# Patient Record
Sex: Female | Born: 1940 | Race: White | Hispanic: No | Smoking: Never smoker
Health system: Southern US, Community
[De-identification: ages and names within clinical notes are randomized; demographics above are authoritative.]

## PROBLEM LIST (undated history)

## (undated) DIAGNOSIS — K589 Irritable bowel syndrome without diarrhea: Secondary | ICD-10-CM

## (undated) DIAGNOSIS — I10 Essential (primary) hypertension: Secondary | ICD-10-CM

## (undated) DIAGNOSIS — S83006A Unspecified dislocation of unspecified patella, initial encounter: Secondary | ICD-10-CM

## (undated) DIAGNOSIS — S82402A Unspecified fracture of shaft of left fibula, initial encounter for closed fracture: Secondary | ICD-10-CM

## (undated) DIAGNOSIS — J189 Pneumonia, unspecified organism: Secondary | ICD-10-CM

## (undated) DIAGNOSIS — M797 Fibromyalgia: Secondary | ICD-10-CM

## (undated) DIAGNOSIS — E785 Hyperlipidemia, unspecified: Secondary | ICD-10-CM

## (undated) DIAGNOSIS — I341 Nonrheumatic mitral (valve) prolapse: Secondary | ICD-10-CM

## (undated) DIAGNOSIS — S82009A Unspecified fracture of unspecified patella, initial encounter for closed fracture: Secondary | ICD-10-CM

## (undated) DIAGNOSIS — K5790 Diverticulosis of intestine, part unspecified, without perforation or abscess without bleeding: Secondary | ICD-10-CM

## (undated) DIAGNOSIS — T8859XA Other complications of anesthesia, initial encounter: Secondary | ICD-10-CM

## (undated) DIAGNOSIS — K219 Gastro-esophageal reflux disease without esophagitis: Secondary | ICD-10-CM

## (undated) HISTORY — PX: TONSILLECTOMY: SUR1361

## (undated) HISTORY — PX: COLONOSCOPY: SHX174

## (undated) HISTORY — DX: Irritable bowel syndrome, unspecified: K58.9

## (undated) HISTORY — PX: APPENDECTOMY: SHX54

## (undated) HISTORY — DX: Hyperlipidemia, unspecified: E78.5

## (undated) HISTORY — DX: Fibromyalgia: M79.7

## (undated) HISTORY — DX: Gastro-esophageal reflux disease without esophagitis: K21.9

## (undated) HISTORY — DX: Diverticulosis of intestine, part unspecified, without perforation or abscess without bleeding: K57.90

## (undated) HISTORY — DX: Unspecified fracture of shaft of left fibula, initial encounter for closed fracture: S82.402A

## (undated) HISTORY — PX: OTHER SURGICAL HISTORY: SHX169

## (undated) HISTORY — DX: Unspecified fracture of unspecified patella, initial encounter for closed fracture: S82.009A

## (undated) HISTORY — DX: Nonrheumatic mitral (valve) prolapse: I34.1

## (undated) HISTORY — DX: Unspecified dislocation of unspecified patella, initial encounter: S83.006A

## (undated) HISTORY — PX: BREAST BIOPSY: SHX20

## (undated) HISTORY — PX: TOOTH EXTRACTION: SUR596

## (undated) HISTORY — PX: HERNIA REPAIR: SHX51

## (undated) HISTORY — PX: CATARACT EXTRACTION: SUR2

## (undated) HISTORY — DX: Essential (primary) hypertension: I10

---
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
## 1975-07-07 HISTORY — PX: VAGINAL HYSTERECTOMY: SUR661

## 1998-03-13 ENCOUNTER — Ambulatory Visit (HOSPITAL_COMMUNITY): Admission: RE | Admit: 1998-03-13 | Discharge: 1998-03-13 | Payer: Self-pay

## 1998-04-12 ENCOUNTER — Ambulatory Visit (HOSPITAL_COMMUNITY): Admission: RE | Admit: 1998-04-12 | Discharge: 1998-04-12 | Payer: Self-pay | Admitting: Gastroenterology

## 1998-04-22 ENCOUNTER — Other Ambulatory Visit: Admission: RE | Admit: 1998-04-22 | Discharge: 1998-04-22 | Payer: Self-pay | Admitting: Obstetrics and Gynecology

## 1998-06-12 ENCOUNTER — Ambulatory Visit (HOSPITAL_COMMUNITY): Admission: RE | Admit: 1998-06-12 | Discharge: 1998-06-12 | Payer: Self-pay

## 1998-07-01 ENCOUNTER — Encounter: Payer: Self-pay | Admitting: Obstetrics and Gynecology

## 1998-07-09 ENCOUNTER — Observation Stay (HOSPITAL_COMMUNITY): Admission: RE | Admit: 1998-07-09 | Discharge: 1998-07-10 | Payer: Self-pay | Admitting: Obstetrics and Gynecology

## 1998-07-10 HISTORY — PX: UMBILICAL HERNIA REPAIR: SHX196

## 1998-07-10 HISTORY — PX: BILATERAL SALPINGOOPHORECTOMY: SHX1223

## 2000-03-22 ENCOUNTER — Emergency Department (HOSPITAL_COMMUNITY): Admission: EM | Admit: 2000-03-22 | Discharge: 2000-03-22 | Payer: Self-pay | Admitting: Emergency Medicine

## 2000-03-22 ENCOUNTER — Encounter: Payer: Self-pay | Admitting: Emergency Medicine

## 2000-12-13 ENCOUNTER — Other Ambulatory Visit: Admission: RE | Admit: 2000-12-13 | Discharge: 2000-12-13 | Payer: Self-pay | Admitting: Obstetrics and Gynecology

## 2000-12-13 LAB — HM PAP SMEAR: HM Pap smear: NEGATIVE

## 2000-12-14 ENCOUNTER — Encounter: Admission: RE | Admit: 2000-12-14 | Discharge: 2000-12-14 | Payer: Self-pay | Admitting: Neurology

## 2000-12-14 ENCOUNTER — Encounter: Payer: Self-pay | Admitting: Neurology

## 2000-12-22 ENCOUNTER — Ambulatory Visit (HOSPITAL_COMMUNITY): Admission: RE | Admit: 2000-12-22 | Discharge: 2000-12-22 | Payer: Self-pay | Admitting: Neurology

## 2001-10-06 ENCOUNTER — Ambulatory Visit (HOSPITAL_COMMUNITY): Admission: RE | Admit: 2001-10-06 | Discharge: 2001-10-06 | Payer: Self-pay | Admitting: Gastroenterology

## 2003-11-19 ENCOUNTER — Ambulatory Visit (HOSPITAL_COMMUNITY): Admission: RE | Admit: 2003-11-19 | Discharge: 2003-11-19 | Payer: Self-pay | Admitting: Gastroenterology

## 2003-12-03 ENCOUNTER — Ambulatory Visit (HOSPITAL_COMMUNITY): Admission: RE | Admit: 2003-12-03 | Discharge: 2003-12-03 | Payer: Self-pay | Admitting: Gastroenterology

## 2004-02-04 HISTORY — PX: CHOLECYSTECTOMY: SHX55

## 2004-02-08 ENCOUNTER — Encounter (INDEPENDENT_AMBULATORY_CARE_PROVIDER_SITE_OTHER): Payer: Self-pay | Admitting: Specialist

## 2004-02-08 ENCOUNTER — Observation Stay (HOSPITAL_COMMUNITY): Admission: RE | Admit: 2004-02-08 | Discharge: 2004-02-09 | Payer: Self-pay | Admitting: Surgery

## 2005-05-29 ENCOUNTER — Emergency Department (HOSPITAL_COMMUNITY): Admission: EM | Admit: 2005-05-29 | Discharge: 2005-05-29 | Payer: Self-pay | Admitting: Emergency Medicine

## 2005-06-17 ENCOUNTER — Encounter: Admission: RE | Admit: 2005-06-17 | Discharge: 2005-06-17 | Payer: Self-pay | Admitting: Gastroenterology

## 2005-07-08 ENCOUNTER — Encounter: Admission: RE | Admit: 2005-07-08 | Discharge: 2005-07-08 | Payer: Self-pay | Admitting: Gastroenterology

## 2008-09-03 HISTORY — PX: BREAST BIOPSY: SHX20

## 2008-09-25 ENCOUNTER — Encounter (INDEPENDENT_AMBULATORY_CARE_PROVIDER_SITE_OTHER): Payer: Self-pay | Admitting: Surgery

## 2008-09-25 ENCOUNTER — Ambulatory Visit (HOSPITAL_BASED_OUTPATIENT_CLINIC_OR_DEPARTMENT_OTHER): Admission: RE | Admit: 2008-09-25 | Discharge: 2008-09-25 | Payer: Self-pay | Admitting: Surgery

## 2009-12-16 ENCOUNTER — Encounter: Admission: RE | Admit: 2009-12-16 | Discharge: 2009-12-16 | Payer: Self-pay | Admitting: Family Medicine

## 2010-01-03 LAB — HM DEXA SCAN

## 2010-11-18 NOTE — Op Note (Signed)
Veronica Franklin, Veronica Franklin               ACCOUNT NO.:  [id number]   MEDICAL RECORD NO.:  [id number]          PATIENT TYPE:  AMB   LOCATION:  DSC                          FACILITY:  [facility]   PHYSICIAN:  Currie Paris, M.D.DATE OF BIRTH:  [birth year]   DATE OF PROCEDURE:  09/25/2008  DATE OF DISCHARGE:                               OPERATIVE REPORT   PREOPERATIVE DIAGNOSES:  1. Probable inflamed epidermoid cyst, right axilla.  2. Dermal cyst, left breast.   POSTOPERATIVE DIAGNOSES:  1. Probable inflamed epidermoid cyst, right axilla.  2. Dermal cyst, left breast.   OPERATION:  Excision of epidermoid cyst of right axilla and left breast.   SURGEON:  Currie Paris, MD   ANESTHESIA:  Local.   CLINICAL HISTORY:  This is a 70 year old lady who has two aforementioned  masses that she wished to have removed.  Since I saw her in the office,  the right one in the axilla has become little bit red.   DESCRIPTION OF PROCEDURE:  The patient was taken to the operating room  for local anesthesia excision.  Both lesions were marked and we  confirmed verbally both the location and the plans with the patient.   Both areas were prepped with some alcohol and anesthetized with a  combination of 1% Xylocaine plain and 0.5% Marcaine with epinephrine.  I  waited 10 minutes for good local effect.  Both areas were then prepped.  The small dermal lesion on the breast was excised into the subcutaneous  tissues.  This was closed with a single 3-0 Vicryl suture and Dermabond.   The lesion on the left axilla was excised with an elliptical excision of  skin overlying it and came out apparently intact with a margin of fatty  tissue deep.  The incision was closed in layers with 3-0 Vicryl and 4-0  Monocryl subcuticular and Dermabond.   The patient tolerated the procedure well and there were no  complications.      Currie Paris, M.D.  Electronically Signed     CJS/MEDQ  D:  09/25/2008  T:   09/25/2008  Job:  [id number]

## 2010-11-21 NOTE — Procedures (Signed)
[facility]  Patient:    Veronica, JEFCOAT [facility] C Visit Number: [id number] MRN: [id number]          Service Type: END Location: ENDO Attending Physician:  Charna Elizabeth Dictated by:   Anselmo Rod, M.D. Proc. Date: 10/06/01 Admit Date:  10/06/2001   CC:         Laqueta Linden, M.D.  Jamesetta Geralds, M.D.   Procedure Report  DATE OF BIRTH:  October 19, 1950.  PROCEDURE:  Colonoscopy.  ENDOSCOPIST:  Anselmo Rod, M.D.  INSTRUMENT USED:  Olympus video colonoscope.  INDICATION FOR PROCEDURE:  Rectal bleeding in a 70 year old white female. Rule out colonic masses, polyps, hemorrhoids, etc.  PREPROCEDURE PREPARATION:  Informed consent was procured from the patient. The patient was fasted for eight hours prior to the procedure and prepped with a bottle of magnesium citrate and a gallon of NuLytely the night prior to the procedure.  PREPROCEDURE PHYSICAL:  VITAL SIGNS:  The patient had stable vital signs.  NECK:  Supple.  CHEST:  Clear to auscultation.  S1, S2 regular.  ABDOMEN:  Soft with normal bowel sounds.  DESCRIPTION OF PROCEDURE:  The patient was placed in the left lateral decubitus position and sedated with 100 mcg of fentanyl and 10 mg of Versed intravenously.  Once the patient was adequately sedate and maintained on low-flow oxygen and continuous cardiac monitoring, the Olympus video colonoscope was advanced from the rectum to the cecum with slight difficulty secondary to some residual stool in the colon.  There were a few left-sided diverticula seen with inspissated stool in some of the diverticular pockets. Small internal hemorrhoids were appreciated on retroflexion in the rectum. The rest of the colonic mucosa up to the cecum appeared normal.  IMPRESSION: 1. Left-sided diverticulosis. 2. Small, nonbleeding internal hemorrhoids. 3. No large masses or polyps seen. 4. Otherwise normal colonoscopy up to the  cecum.  IMPRESSION: 1. A high-fiber diet has been advised for the patient. 2. Repeat colorectal cancer screening is recommended in the next five years    unless the patient were to develop any abnormal symptoms in the interim. 3. Outpatient follow-up on a p.r.n. basis.  RECOMMENDATIONS: Dictated by:   Anselmo Rod, M.D. Attending Physician:  Charna Elizabeth DD:  10/06/01 TD:  10/07/01 Job: [id number]

## 2010-11-21 NOTE — Op Note (Signed)
NAME:  Veronica Franklin, Veronica Franklin                         ACCOUNT NO.:  [id number]   MEDICAL RECORD NO.:  [id number]                   PATIENT TYPE:  OBV   LOCATION:  0465                                 FACILITY[facility]  [facility]   PHYSICIAN:  [name], M.D.              DATE OF BIRTH:  [birth year]   DATE OF PROCEDURE:  02/08/2004  DATE OF DISCHARGE:  02/09/2004                                 OPERATIVE REPORT   PREOPERATIVE DIAGNOSIS:  Biliary dyskinesia.   POSTOPERATIVE DIAGNOSIS:  Biliary dyskinesia.   PROCEDURE:  Laparoscopic cholecystectomy.   SURGEON:  Dr. Riley Lam A. Blackman   ANESTHESIA:  General endotracheal and 0.25% Marcaine.   ESTIMATED BLOOD LOSS:  Minimal.   FINDINGS:  The patient was found to have a chronically scarred-appearing  gallbladder with multiple adhesions to it.  Cholangiogram was not performed.   PROCEDURE IN DETAIL:  The patient was brought to the operating room and  identified as Sheralyn Boatman.  She was placed supine on the operating room  table, and anesthesia was induced.  Her abdomen was then prepped and draped  in the usual sterile fashion.  Using a #15 blade, a small vertical incision  was made below the umbilicus.  The incision was carried down to the fascia  which was then opened with a scalpel.  A hemostat was then used to pass into  the peritoneal cavity.  A 0 Vicryl pursestring suture was placed around the  fascial opening.  The Hasson port was placed through the opening, and  insufflation of the abdomen was begun.  A 12 mm port was then placed in the  patient's epigastrium under direct vision.  The patient was found to have  adhesions of omentum to the abdominal wall, and these were taken down with  the laparoscopic scissors.  Two 5 mm ports were then placed in the patient's  right flank under direct vision.  The gallbladder was then identified and  retracted above the liver bed.  Multiple adhesions of omentum to the  gallbladder were then taken down  bluntly.  Dissection was then carried down  to the base of the gallbladder.  What was thought to be a band of peritoneum  ended up being the small cystic duct.  It was clipped twice proximally, once  distally before being transected.  Since this was performed, the decision  was made to forego cholangiogram, but this was obviously the cystic duct.  At this point, the cystic artery was then identified and clipped twice  proximally, once distally, and transected as well.  The gallbladder was then  easily dissected free from the liver bed with the electrocautery.  Once the  gallbladder was free from the liver bed, it was removed through the incision  at the umbilicus.  The 0 Vicryl at the umbilicus was tied in place closing  the fascial defect.  The liver bed was again examined, and hemostasis was  felt to be achieved.  At this point, all ports were removed under direct  vision after the abdomen was irrigated.  The abdomen was then deflated.  All  incisions were anesthetized with  0.25% Marcaine and closed with 4-0 Monocryl subcuticular sutures.  Steri-  Strips, gauze, and tape were then applied.  The patient tolerated the  procedure well.  All sponge, needle, and instrument counts were correct at  the end of the procedure.  The patient was then extubated in the operating  room and taken in stable condition to the recovery room.                                               [name], M.D.    DB/MEDQ  D:  02/08/2004  T:  02/10/2004  Job:  [id number]

## 2012-09-30 LAB — HM COLONOSCOPY

## 2013-01-26 ENCOUNTER — Encounter: Payer: Self-pay | Admitting: Nurse Practitioner

## 2013-01-26 ENCOUNTER — Encounter: Payer: Self-pay | Admitting: *Deleted

## 2013-01-26 ENCOUNTER — Ambulatory Visit (INDEPENDENT_AMBULATORY_CARE_PROVIDER_SITE_OTHER): Payer: Medicare Other | Admitting: Nurse Practitioner

## 2013-01-26 VITALS — BP 114/60 | HR 64 | Temp 98.3°F | Resp 12 | Ht 64.0 in | Wt 142.4 lb

## 2013-01-26 DIAGNOSIS — Z13820 Encounter for screening for osteoporosis: Secondary | ICD-10-CM

## 2013-01-26 DIAGNOSIS — Z9289 Personal history of other medical treatment: Secondary | ICD-10-CM

## 2013-01-26 DIAGNOSIS — Z9189 Other specified personal risk factors, not elsewhere classified: Secondary | ICD-10-CM

## 2013-01-26 DIAGNOSIS — B3731 Acute candidiasis of vulva and vagina: Secondary | ICD-10-CM

## 2013-01-26 DIAGNOSIS — B373 Candidiasis of vulva and vagina: Secondary | ICD-10-CM

## 2013-01-26 DIAGNOSIS — N390 Urinary tract infection, site not specified: Secondary | ICD-10-CM

## 2013-01-26 LAB — POCT URINALYSIS DIPSTICK
Leukocytes, UA: NEGATIVE
Spec Grav, UA: 1.02
Urobilinogen, UA: NEGATIVE
pH, UA: 6.5

## 2013-01-26 MED ORDER — NYSTATIN-TRIAMCINOLONE 100000-0.1 UNIT/GM-% EX OINT: TOPICAL_OINTMENT | Freq: Two times a day (BID) | CUTANEOUS | Status: DC

## 2013-01-26 MED ORDER — CIPROFLOXACIN HCL 500 MG PO TABS: 500.0000 mg | ORAL_TABLET | Freq: Two times a day (BID) | ORAL | Status: DC

## 2013-01-26 MED ORDER — FLUCONAZOLE 150 MG PO TABS: 150.0000 mg | ORAL_TABLET | Freq: Once | ORAL | Status: DC

## 2013-01-26 NOTE — Progress Notes (Signed)
Subjective:     Patient ID: Veronica Franklin, female   DOB: 10/29/1940, 72 y.o.   MRN: [id number]  HPI  72 yo WD Fe presents with symptoms of frequent urination.  She noted symptoms on Sunday and Monday and had associated  low back pain. Denies fever or chills. Her other symptoms seem to be related to a chronic perianal discomfort.  She felt symptoms were related to having a large BM and noted BRRB. She then saw Dr. Loreta Ave and related this finding  and had further evaluation including a coloscopy that was normal in 08/2012.  Since then she still has peri anal itching and at times specks of blood that she feels is related to scratching as it only there when she wipes.  Never anything with pad wear. She is ready to leave [place] on Saturday for 2 weeks.  Review of Systems  Constitutional: Negative for fever, chills and fatigue.  Respiratory: Negative.   Cardiovascular: Negative.  Negative for chest pain and palpitations.  Gastrointestinal: Positive for blood in stool and anal bleeding. Negative for nausea, vomiting, abdominal pain, diarrhea, constipation and abdominal distention.       Blood with large BM that is only occasional. Negative GI evaluation 2/ 2014.  Genitourinary: Positive for dysuria, urgency, frequency, vaginal discharge, difficulty urinating and pelvic pain.       Peri anal discomfort with irritation, burning and occasional specks of blood. Recent UTI symptoms as noted.  Musculoskeletal: Positive for back pain.       Low back pain but not at flank.  Neurological: Negative for dizziness, syncope, weakness, numbness and headaches.  Psychiatric/Behavioral: Negative for confusion and sleep disturbance. The patient is not nervous/anxious.        Objective:   Physical Exam  Constitutional: She is oriented to person, place, and time. She appears well-developed and well-nourished.  Cardiovascular: Normal rate.   Pulmonary/Chest: Effort normal.  Abdominal: Soft. She  exhibits no distension and no mass. There is no rebound and no guarding.  Genitourinary:    No vaginal discharge found.  Multiple areas external vulva with linear cuts and maceration of skin consistent with chronic yeast.  No vaginal discharge. Since patient unable to void - sterile In/ Out cath specimen was obtained using sterile technique.  Chemstrip was negative and urine was sent for C&S. Patient tolerated procedure well.  Musculoskeletal: Normal range of motion.  Neurological: She is alert and oriented to person, place, and time.  Psychiatric: She has a normal mood and affect. Her behavior is normal. Judgment and thought content normal.       Assessment:     External Yeast Vulvitis extending to the perianal area  R/O UTI Trip out of the country for two weeks this Saturday.    Plan:    Cipro 500 mg BID for 3 days just in case culture comes back + while she is away ( she is given instructions for longer use if symptoms come back or worsens) # 20 /0 She is given Diflucan 150 mg to take today and repeat in 3 days ( #4 tabs given for extra while she is away if indicated) Rx for Mycolog to use prn BID. When she returns will report if she is not better. Or if worsens will seek medical care with their team. She ask that we go ahead and send order for Mammo including 3D and BMD to [facility] for later this summer. This is done.

## 2013-01-26 NOTE — Patient Instructions (Signed)
Monilial Vaginitis Vaginitis in a soreness, swelling and redness (inflammation) of the vagina and vulva. Monilial vaginitis is not a sexually transmitted infection. CAUSES  Yeast vaginitis is caused by yeast (candida) that is normally found in your vagina. With a yeast infection, the candida has overgrown in number to a point that upsets the chemical balance. SYMPTOMS   White, thick vaginal discharge.  Swelling, itching, redness and irritation of the vagina and possibly the lips of the vagina (vulva).  Burning or painful urination.  Painful intercourse. DIAGNOSIS  Things that may contribute to monilial vaginitis are:  Postmenopausal and virginal states.  Pregnancy.  Infections.  Being tired, sick or stressed, especially if you had monilial vaginitis in the past.  Diabetes. Good control will help lower the chance.      Take Diflucan 150 mg today and repeat on Saturday , keep other two tablets if you need while away every three days Take Cipro 500 mg two times daily for 3 days,  Then if symptoms persist or worsen continue at 2 X daily  Tight fitting garments.  Using bubble bath, feminine sprays, douches or deodorant tampons.  Taking certain medications that kill germs (antibiotics).  Sporadic recurrence can occur if you become ill. TREATMENT  Your caregiver will give you medication.  There are several kinds of anti monilial vaginal creams and suppositories specific for monilial vaginitis. For recurrent yeast infections, use a suppository or cream in the vagina 2 times a week, or as directed.  Anti-monilial or steroid cream for the itching or irritation of the vulva may also be used. Get your caregiver's permission.  Painting the vagina with methylene blue solution may help if the monilial cream does not work.  Eating yogurt may help prevent monilial vaginitis. HOME CARE INSTRUCTIONS   Finish all medication as prescribed.  Do not have sex until treatment is  completed or after your caregiver tells you it is okay.  Take warm sitz baths.  Do not douche.  Do not use tampons, especially scented ones.  Wear cotton underwear.  Avoid tight pants and panty hose.  Tell your sexual partner that you have a yeast infection. They should go to their caregiver if they have symptoms such as mild rash or itching.  Your sexual partner should be treated as well if your infection is difficult to eliminate.  Practice safer sex. Use condoms.  Some vaginal medications cause latex condoms to fail. Vaginal medications that harm condoms are:  Cleocin cream.  Butoconazole (Femstat).  Terconazole (Terazol) vaginal suppository.  Miconazole (Monistat) (may be purchased over the counter). SEEK MEDICAL CARE IF:   You have a temperature by mouth above 102 F (38.9 C).  The infection is getting worse after 2 days of treatment.  The infection is not getting better after 3 days of treatment.  You develop blisters in or around your vagina.  You develop vaginal bleeding, and it is not your menstrual period.  You have pain when you urinate.  You develop intestinal problems.  You have pain with sexual intercourse. Document Released: 04/01/2005 Document Revised: 09/14/2011 Document Reviewed: 12/14/2008 [facility] Patient Information 2014 [place], [place].

## 2013-01-28 LAB — URINE CULTURE
Colony Count: NO GROWTH
Organism ID, Bacteria: NO GROWTH

## 2013-01-30 NOTE — Progress Notes (Signed)
Encounter reviewed by Dr. Naiomi Musto Silva.  

## 2013-01-31 ENCOUNTER — Telehealth: Payer: Self-pay | Admitting: *Deleted

## 2013-01-31 NOTE — Telephone Encounter (Signed)
Message copied by Osie Bond on Tue Jan 31, 2013 11:28 AM ------      Message from: Ria Comment R      Created: Mon Jan 30, 2013  6:45 PM       She is out of the country but you may call and leave a message at home with results of neg urine CS ------

## 2013-01-31 NOTE — Telephone Encounter (Signed)
Pt is aware of negative urine culture results, via voice mail.

## 2013-03-28 ENCOUNTER — Telehealth: Payer: Self-pay | Admitting: Nurse Practitioner

## 2013-03-28 NOTE — Telephone Encounter (Signed)
Call to [facility] to advise we have faxed an order for pt to have BMD today at her noon appt. Order received.

## 2013-03-28 NOTE — Telephone Encounter (Signed)
[facility] appointment for MMG and bone density today, 03/28/13, at 12:00 PM and needs an order sent for the appointment please.

## 2013-03-29 ENCOUNTER — Telehealth: Payer: Self-pay | Admitting: Obstetrics and Gynecology

## 2013-03-29 NOTE — Telephone Encounter (Signed)
Patient calling with a billing question. °

## 2013-04-10 ENCOUNTER — Telehealth: Payer: Self-pay | Admitting: *Deleted

## 2013-04-10 NOTE — Telephone Encounter (Signed)
Pt informed of results to be treated with Lamisil or laser, pt states would like to schedule for laser.  I transferred to [facility] to schedule.

## 2013-04-10 NOTE — Telephone Encounter (Signed)
Left message to call the [facility] for lab results.

## 2013-04-11 ENCOUNTER — Telehealth: Payer: Self-pay | Admitting: Nurse Practitioner

## 2013-04-11 NOTE — Telephone Encounter (Signed)
Please advise Ms. [name] 

## 2013-04-11 NOTE — Telephone Encounter (Signed)
Patient is calling for a medication refill but the pharmacy is no longer carrying the medication. She has been with out the medication since Friday. Pharmacy told her it would be ready for her tomorrow but when she went to pick it up they told her that they no longer carry the medication anymore.  Cenestin  [facility] on [address]

## 2013-04-12 MED ORDER — ESTERIFIED ESTROGENS 0.3 MG PO TABS: 0.3000 mg | ORAL_TABLET | Freq: Every day | ORAL | Status: DC

## 2013-04-12 NOTE — Telephone Encounter (Signed)
Okay to send in one month supply?

## 2013-04-12 NOTE — Telephone Encounter (Signed)
LM on VM that rx  (Menest 0.3 mg was called in)  and to call us back if there are any problems or issues

## 2013-04-12 NOTE — Telephone Encounter (Signed)
AEX is 05/04/13 with you

## 2013-04-12 NOTE — Telephone Encounter (Signed)
There is no other dose of a generic that is equal to her dose of Cenestin.  I would suggest to change to Menest 0.3 mg daily as a substitute.

## 2013-04-12 NOTE — Telephone Encounter (Signed)
Order is placed in Epic.

## 2013-05-04 ENCOUNTER — Encounter: Payer: Self-pay | Admitting: Nurse Practitioner

## 2013-05-04 ENCOUNTER — Ambulatory Visit (INDEPENDENT_AMBULATORY_CARE_PROVIDER_SITE_OTHER): Payer: Medicare Other | Admitting: Nurse Practitioner

## 2013-05-04 VITALS — BP 100/60 | HR 64 | Resp 16 | Ht 65.0 in | Wt 144.0 lb

## 2013-05-04 DIAGNOSIS — Z01419 Encounter for gynecological examination (general) (routine) without abnormal findings: Secondary | ICD-10-CM

## 2013-05-04 MED ORDER — ESTERIFIED ESTROGENS 0.3 MG PO TABS: 0.3000 mg | ORAL_TABLET | Freq: Every day | ORAL | Status: DC

## 2013-05-04 NOTE — Progress Notes (Signed)
Encounter reviewed by Dr. Kayle Correa Silva.  

## 2013-05-04 NOTE — Patient Instructions (Signed)

## 2013-05-04 NOTE — Progress Notes (Signed)
Patient ID: Veronica Franklin, female   DOB: 19-Dec-1940, 72 y.o.   MRN: [id number] 73 y.o. G3P3 Divorced Caucasian Fe here for annual exam.  recent [place] travel this year.  No LMP recorded. Patient has had a hysterectomy.          Sexually active: no  The current method of family planning is status post hysterectomy.    Exercising: no  The patient does not participate in regular exercise at present. Smoker:  no  Health Maintenance: Pap: 12/13/00, WNL MMG: 03/28/13, 3D: benign Colonoscopy: 09/30/12, repeat 10 years BMD: 03/28/13, no result at time of visit - sent to PCP TDaP:12/13/10 Labs: PCP   reports that she has never smoked. She has never used smokeless tobacco. She reports that she does not drink alcohol or use illicit drugs.  Past Medical History  Diagnosis Date  . Fibromyalgia   . IBS (irritable bowel syndrome)   . Diverticulosis     Past Surgical History  Procedure Laterality Date  . Vaginal delivery      x3  . Vaginal hysterectomy    . Bilateral salpingoophorectomy    . Cholecystectomy    . Breast biopsy    . Colonoscopy    . Umbilical hernia repair    . Thigh lipoma Right     removed    Current Outpatient Prescriptions  Medication Sig Dispense Refill  . calcium carbonate (TUMS - DOSED IN MG ELEMENTAL CALCIUM) 500 MG chewable tablet Chew 1 tablet by mouth 2 (two) times daily.      . cetirizine (ZYRTEC) 10 MG tablet Take 10 mg by mouth daily.      Marland Kitchen conjugated estrogens (PREMARIN) vaginal cream Place vaginally daily.      Marland Kitchen Dexlansoprazole (DEXILANT) 30 MG capsule Take 60 mg by mouth daily.      . Esterified Estrogens (MENEST) 0.3 MG tablet Take 1 tablet (0.3 mg total) by mouth daily.  30 tablet  0  . hydrochlorothiazide (HYDRODIURIL) 25 MG tablet Take 25 mg by mouth daily.      . hyoscyamine (LEVBID) 0.375 MG 12 hr tablet Take 0.375 mg by mouth every 12 (twelve) hours as needed for cramping.      . montelukast (SINGULAIR) 10 MG tablet Take 10 mg by mouth at bedtime.       Marland Kitchen nystatin-triamcinolone ointment (MYCOLOG) Apply topically 2 (two) times daily.  30 g  3  . polycarbophil (FIBERCON) 625 MG tablet Take 625 mg by mouth daily.      . potassium gluconate 595 MG TABS Take 595 mg by mouth daily.      . propranolol (INDERAL) 40 MG tablet Take 40 mg by mouth 2 (two) times daily.      . sodium chloride (OCEAN NASAL SPRAY) 0.65 % nasal spray Place 1 spray into the nose as needed for congestion.      . vitamin B-12 (CYANOCOBALAMIN) 100 MCG tablet Take 50 mcg by mouth daily.       No current facility-administered medications for this visit.    Family History  Problem Relation Age of Onset  . Heart failure Mother   . Ulcers Mother   . Pleurisy Father   . Cancer Maternal Aunt     uterine cancer  . Cancer Paternal Aunt     lymphoma  . Diabetes Maternal Grandmother   . Diverticulitis Son   . Diverticulitis Daughter     ROS:  Pertinent items are noted in HPI.  Otherwise, a comprehensive ROS was  negative.  Exam:   BP 100/60  Pulse 64  Resp 16  Ht 5\' 5"  (1.651 m)  Wt 144 lb (65.318 kg)  BMI 23.96 kg/m2 Height: 5\' 5"  (165.1 cm)  Ht Readings from Last 3 Encounters:  05/04/13 5\' 5"  (1.651 m)  01/26/13 5\' 4"  (1.626 m)    General appearance: alert, cooperative and appears stated age Head: Normocephalic, without obvious abnormality, atraumatic Neck: no adenopathy, supple, symmetrical, trachea midline and thyroid normal to inspection and palpation Lungs: clear to auscultation bilaterally Breasts: normal appearance, no masses or tenderness Heart: regular rate and rhythm Abdomen: soft, non-tender; no masses,  no organomegaly Extremities: extremities normal, atraumatic, no cyanosis or edema Skin: Skin color, texture, turgor normal. No rashes or lesions Lymph nodes: Cervical, supraclavicular, and axillary nodes normal. No abnormal inguinal nodes palpated Neurologic: Grossly normal   Pelvic: External genitalia:  no lesions              Urethra:   normal appearing urethra with no masses, tenderness or lesions              Bartholin's and Skene's: normal                 Vagina: atrophic appearing vagina with normal color and discharge, no lesions              Cervix: absent              Pap taken: no Bimanual Exam:  Uterus:  uterus absent              Adnexa: no mass, fullness, tenderness               Rectovaginal: Confirms               Anus:  normal sphincter tone, no lesions  A:  Well Woman with normal exam  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  Discussed risk with DVT, CVA, and cancer  Does not need a refill of Premarin  cream at this time - will call back  Counseled on breast self exam, adequate intake of calcium and vitamin D, diet and exercise, Kegel's exercises return annually or prn  An After Visit Summary was printed and given to the patient.

## 2013-05-05 ENCOUNTER — Ambulatory Visit (INDEPENDENT_AMBULATORY_CARE_PROVIDER_SITE_OTHER): Payer: Medicare Other | Admitting: Podiatrist

## 2013-05-05 ENCOUNTER — Encounter: Payer: Self-pay | Admitting: Podiatrist

## 2013-05-05 VITALS — BP 109/66 | HR 73 | Resp 16 | Ht 65.0 in | Wt 144.5 lb

## 2013-05-05 DIAGNOSIS — B351 Tinea unguium: Secondary | ICD-10-CM

## 2013-05-05 NOTE — Progress Notes (Signed)
Subjective:  Patient presents today for lasering of left hallux nail. Patient relates it is still thick and discolored.  Today is her first laser visit Objective:  Neurovascular status unchanged with palpable symmetric pedal pulses.  Left great toenail is thickened and yellowed.  Mycotic appearance of toenail is seen.   Assessment:  Onychomycosis x 1 toenail  Laserering of Toenails left great toe carried out at today's visit via Q-switch YAG laser by [employer] at continuous Patient and staff were wearing appropriate laser protective goggles/eyewear Laser device was tested prior to use and safety protocols were followed Frequency of 5 Hz, Level 4, Joules 7.5 delivered.          The patient tolerated the lasering well and will call if any concerns arise.

## 2013-05-15 ENCOUNTER — Telehealth: Payer: Self-pay

## 2013-05-15 NOTE — Telephone Encounter (Signed)
Lmtcb//kn 

## 2013-05-17 NOTE — Telephone Encounter (Signed)
Returning a call to [facility]. (patient is aware Tresa Endo is out of the office today)

## 2013-05-18 NOTE — Telephone Encounter (Signed)
Lmtcb//kn 

## 2013-05-23 NOTE — Telephone Encounter (Signed)
Patient notified of BMD results. States is still having lower back pain-seems worse in the morning and at night, better during the day. She is not sure if this is related to her fibromyalgia. States also thinks she is having some urine frequency, but not sure. States would like to monitor symptoms and will call back if any changes. Did see PG over the summer with these same symptoms and had negative urine culture. Please advise if this is ok or if I need to call her back with any recommendations.

## 2013-05-23 NOTE — Telephone Encounter (Signed)
Could also be arthritis.  Ok to watch symptoms but please let us know if anything worsens.

## 2013-05-29 NOTE — Telephone Encounter (Signed)
Patient returning Kelly's call. °

## 2013-05-29 NOTE — Telephone Encounter (Signed)
Lmtcb//kn 

## 2013-06-07 NOTE — Telephone Encounter (Signed)
Patient aware, will call back prn//kn

## 2013-06-09 ENCOUNTER — Ambulatory Visit: Payer: Medicare Other | Admitting: Podiatrist

## 2013-07-06 HISTORY — PX: KNEE DISLOCATION SURGERY: SHX689

## 2013-07-07 ENCOUNTER — Ambulatory Visit (INDEPENDENT_AMBULATORY_CARE_PROVIDER_SITE_OTHER): Payer: Medicare Other | Admitting: Podiatrist

## 2013-07-07 ENCOUNTER — Encounter: Payer: Self-pay | Admitting: Podiatrist

## 2013-07-07 VITALS — BP 129/70 | HR 72 | Resp 12

## 2013-07-07 DIAGNOSIS — B351 Tinea unguium: Secondary | ICD-10-CM

## 2013-07-07 NOTE — Progress Notes (Signed)
Subjective: Patient presents today for second lasering of left hallux nail. Patient relates it is still thick and discolored. However she's noticed a change in the base of the nail. Objective: Neurovascular status unchanged with palpable symmetric pedal pulses. Left great toenail is thickened and yellowed. Mycotic appearance of toenail is seen. Mild clearing at the base of the toenail is seen. Assessment: Onychomycosis x 1 toenail  Laserering of Toenails left great toe carried out at today's visit via Q-switch YAG laser by [employer] at continuous  Patient and staff were wearing appropriate laser protective goggles/eyewear  Laser device was tested prior to use and safety protocols were followed  Frequency of 5 Hz, Level 4, Joules 7.5 delivered.  The patient tolerated the lasering well and will be seen back in 2 months for her final laser followup call if any concerns arise.

## 2013-07-07 NOTE — Progress Notes (Deleted)
   Subjective:    Patient ID: Veronica Franklin, female    DOB: 1940/12/08, 73 y.o.   MRN: [id number]  HPI Comments: '' THE LT FOOT GREAT TOENAIL LOOKS MUCH BETTER.''     Review of Systems     Objective:   Physical Exam        Assessment & Plan:

## 2013-08-04 ENCOUNTER — Encounter: Payer: Self-pay | Admitting: Podiatrist

## 2013-08-25 ENCOUNTER — Telehealth: Payer: Self-pay | Admitting: Obstetrics & Gynecology

## 2013-08-25 NOTE — Telephone Encounter (Signed)
Spoke with patient. She complains of lower back pain that has been "going on for while now." Patient  With hx of fibromyalgia.  Denies any urinary complaints, no pelvic pain, no fevers, no vaginal bleeding or discharge.  She states that pain comes and goes and it is better during the day and worsens at night. Pain does not radiate. No injuries, no trauma. Patient states she is going out of town and thought "I better just get this checked out". Advised that patient should be seen by primary care physician as they may be better able to manage lower back pain. Patient is agreeable and states that she will call Dr. Charlott Rakesoss's office now.  Routing to provider for final review. Patient agreeable to disposition. Will close encounter   [name] do you agree?

## 2013-08-25 NOTE — Telephone Encounter (Signed)
Patient is having lower back pain. 

## 2013-09-08 ENCOUNTER — Ambulatory Visit (INDEPENDENT_AMBULATORY_CARE_PROVIDER_SITE_OTHER): Payer: Medicare Other | Admitting: Podiatrist

## 2013-09-08 ENCOUNTER — Encounter: Payer: Self-pay | Admitting: Podiatrist

## 2013-09-08 VITALS — BP 128/75 | HR 73 | Resp 18

## 2013-09-08 DIAGNOSIS — B351 Tinea unguium: Secondary | ICD-10-CM

## 2013-09-08 NOTE — Progress Notes (Signed)
I can tell that it has growed out on my left foot left great toenail  Patient presents for her 3rd lasering on the left great toenail.  She can see some clearing of the proximal portion but states the toenail grows very slowly.    Laserering of Toenail- Hallux nail left carried out at today's visit via Q-switch YAG laser by [employer] at continuous Patient and staff were wearing appropriate laser protective goggles/eyewear Laser device was tested prior to use and safety protocols were followed Frequency of 5 Hz, Level 4, Joules 7.5 delivered.        The patient tolerated the lasering well and will call if any concerns arise. I will see her back in 2 months for additional lasering-  She may need 2 more treatments due to the slow growth of the toenail

## 2013-10-04 DIAGNOSIS — S83006A Unspecified dislocation of unspecified patella, initial encounter: Secondary | ICD-10-CM

## 2013-10-04 DIAGNOSIS — S82402A Unspecified fracture of shaft of left fibula, initial encounter for closed fracture: Secondary | ICD-10-CM

## 2013-10-04 HISTORY — DX: Unspecified dislocation of unspecified patella, initial encounter: S83.006A

## 2013-10-04 HISTORY — DX: Unspecified fracture of shaft of left fibula, initial encounter for closed fracture: S82.402A

## 2013-10-30 ENCOUNTER — Emergency Department (HOSPITAL_COMMUNITY)
Admission: EM | Admit: 2013-10-30 | Discharge: 2013-10-30 | Disposition: A | Discharge status: Home / Self Care | Payer: Medicare Other | Attending: Emergency Medicine | Admitting: Emergency Medicine

## 2013-10-30 ENCOUNTER — Encounter (HOSPITAL_COMMUNITY): Payer: Self-pay | Admitting: Emergency Medicine

## 2013-10-30 ENCOUNTER — Emergency Department (HOSPITAL_COMMUNITY): Payer: Medicare Other

## 2013-10-30 DIAGNOSIS — Y92009 Unspecified place in unspecified non-institutional (private) residence as the place of occurrence of the external cause: Secondary | ICD-10-CM | POA: Insufficient documentation

## 2013-10-30 DIAGNOSIS — W010XXA Fall on same level from slipping, tripping and stumbling without subsequent striking against object, initial encounter: Secondary | ICD-10-CM | POA: Insufficient documentation

## 2013-10-30 DIAGNOSIS — S82009A Unspecified fracture of unspecified patella, initial encounter for closed fracture: Secondary | ICD-10-CM

## 2013-10-30 DIAGNOSIS — S83006A Unspecified dislocation of unspecified patella, initial encounter: Secondary | ICD-10-CM

## 2013-10-30 DIAGNOSIS — S82402A Unspecified fracture of shaft of left fibula, initial encounter for closed fracture: Secondary | ICD-10-CM

## 2013-10-30 DIAGNOSIS — Z8719 Personal history of other diseases of the digestive system: Secondary | ICD-10-CM | POA: Insufficient documentation

## 2013-10-30 DIAGNOSIS — IMO0002 Reserved for concepts with insufficient information to code with codable children: Secondary | ICD-10-CM | POA: Insufficient documentation

## 2013-10-30 DIAGNOSIS — M25462 Effusion, left knee: Secondary | ICD-10-CM

## 2013-10-30 DIAGNOSIS — Y9389 Activity, other specified: Secondary | ICD-10-CM | POA: Insufficient documentation

## 2013-10-30 DIAGNOSIS — S82899A Other fracture of unspecified lower leg, initial encounter for closed fracture: Secondary | ICD-10-CM | POA: Insufficient documentation

## 2013-10-30 DIAGNOSIS — I10 Essential (primary) hypertension: Secondary | ICD-10-CM | POA: Insufficient documentation

## 2013-10-30 DIAGNOSIS — Z79899 Other long term (current) drug therapy: Secondary | ICD-10-CM | POA: Insufficient documentation

## 2013-10-30 MED ORDER — FENTANYL CITRATE 0.05 MG/ML IJ SOLN
50.0000 ug | Freq: Once | INTRAMUSCULAR | Status: AC
  Administered 2013-10-30: 50 ug via INTRAVENOUS
  Filled 2013-10-30: qty 2

## 2013-10-30 MED ORDER — OXYCODONE-ACETAMINOPHEN 5-325 MG PO TABS: 1.0000 | ORAL_TABLET | Freq: Four times a day (QID) | ORAL | Status: DC | PRN

## 2013-10-30 MED ORDER — ONDANSETRON HCL 4 MG PO TABS
4.0000 mg | ORAL_TABLET | Freq: Once | ORAL | Status: AC
  Administered 2013-10-30: 4 mg via ORAL
  Filled 2013-10-30: qty 1

## 2013-10-30 MED ORDER — ONDANSETRON HCL 4 MG/2ML IJ SOLN
4.0000 mg | Freq: Once | INTRAMUSCULAR | Status: AC
  Administered 2013-10-30: 4 mg via INTRAVENOUS
  Filled 2013-10-30: qty 2

## 2013-10-30 MED ORDER — ONDANSETRON HCL 4 MG PO TABS: 4.0000 mg | ORAL_TABLET | Freq: Four times a day (QID) | ORAL | Status: DC

## 2013-10-30 NOTE — ED Notes (Addendum)
Per EMS: Pt reports slipping on some water in her kitchen and fell backwards. Pt denies LOC or hitting her head. Pt reports left ankle pain, left knee pain, and left lower back pain. Pt has moderate swelling to her left knee. Pt has strong pedal pulses and capillary refill is less than 2 seconds. Pt reports tingling to the toes of the left foot. Pt was given 50 mcg of fentanyl by EMS in route to facility. Pt is A/O x4. Pt reports a history of knee dislocation.

## 2013-10-30 NOTE — Discharge Instructions (Signed)
Cast or Splint Care Casts and splints support injured limbs and keep bones from moving while they heal. It is important to care for your cast or splint at home.  HOME CARE INSTRUCTIONS  Keep the cast or splint uncovered during the drying period. It can take 24 to 48 hours to dry if it is made of plaster. A fiberglass cast will dry in less than 1 hour.  Do not rest the cast on anything harder than a pillow for the first 24 hours.  Do not put weight on your injured limb or apply pressure to the cast until your health care provider gives you permission.  Keep the cast or splint dry. Wet casts or splints can lose their shape and may not support the limb as well. A wet cast that has lost its shape can also create harmful pressure on your skin when it dries. Also, wet skin can become infected.  Cover the cast or splint with a plastic bag when bathing or when out in the rain or snow. If the cast is on the trunk of the body, take sponge baths until the cast is removed.  If your cast does become wet, dry it with a towel or a blow dryer on the cool setting only.  Keep your cast or splint clean. Soiled casts may be wiped with a moistened cloth.  Do not place any hard or soft foreign objects under your cast or splint, such as cotton, toilet paper, lotion, or powder.  Do not try to scratch the skin under the cast with any object. The object could get stuck inside the cast. Also, scratching could lead to an infection. If itching is a problem, use a blow dryer on a cool setting to relieve discomfort.  Do not trim or cut your cast or remove padding from inside of it.  Exercise all joints next to the injury that are not immobilized by the cast or splint. For example, if you have a long leg cast, exercise the hip joint and toes. If you have an arm cast or splint, exercise the shoulder, elbow, thumb, and fingers.  Elevate your injured arm or leg on 1 or 2 pillows for the first 1 to 3 days to decrease  swelling and pain.It is best if you can comfortably elevate your cast so it is higher than your heart. SEEK MEDICAL CARE IF:   Your cast or splint cracks.  Your cast or splint is too tight or too loose.  You have unbearable itching inside the cast.  Your cast becomes wet or develops a soft spot or area.  You have a bad smell coming from inside your cast.  You get an object stuck under your cast.  Your skin around the cast becomes red or raw.  You have new pain or worsening pain after the cast has been applied. SEEK IMMEDIATE MEDICAL CARE IF:   You have fluid leaking through the cast.  You are unable to move your fingers or toes.  You have discolored (blue or white), cool, painful, or very swollen fingers or toes beyond the cast.  You have tingling or numbness around the injured area.  You have severe pain or pressure under the cast.  You have any difficulty with your breathing or have shortness of breath.  You have chest pain. Document Released: 06/19/2000 Document Revised: 04/12/2013 Document Reviewed: 12/29/2012 [facility] Patient Information 2014 [place], [place].  Fibular Fracture, Ankle, Adult, Undisplaced, Treated With Immobilization A simple fracture of the bone  below the knee on the outside of your leg (fibula) usually heals without problems. CAUSES Typically, a fibular fracture occurs as a result of trauma. A blow to the side of your leg or a powerful twisting movement can cause a fracture. Fibular fractures are often seen as a result of football, soccer, or skiing injuries. SYMPTOMS Symptoms of a fibular fracture can include:  Pain.  Shortening or abnormal alignment of your lower leg (angulation). DIAGNOSIS A health care provider will need to examine the leg. X-ray exams will be ordered for further to confirm the fracture and evaluate the extent and of the injury. TREATMENT  Typically, a cast or immobilizer is applied. Sometimes a splint is placed on these  fractures if it is needed for comfort or if the bones are badly out of place. Crutches may be needed to help you get around.  HOME CARE INSTRUCTIONS   Apply ice to the injured area:  Put ice in a plastic bag.  Place a towel between your skin and the bag.  Leave the ice on for 20 minutes, 2 3 times a day.  Use crutches as directed. Resume walking without crutches as directed by your health care provider or when comfortable doing so.  Only take over-the-counter or prescription medicines for pain, discomfort, or fever as directed by your health care provider.  Keeping your leg raised may lessen swelling.  If you have a removable splint or boot, do not remove the boot unless directed by your health care provider.  Do not not drive a car or operate a motor vehicle until your health care provider specifically tells you it is safe to do so. SEEK IMMEDIATE MEDICAL CARE IF:   Your cast gets damaged or breaks.  You have continued severe pain or more swelling than you did before the cast was put on, or the pain is not controlled with medications.  Your skin or nails below the injury turn blue or grey, or feel cold or numb.  There is a bad smell or pus coming from under the cast.  You develop severe pain in ankle or foot. MAKE SURE YOU:   Understand these instructions.  Will watch your condition.  Will get help right away if you are not doing well or get worse. Document Released: 03/14/2002 Document Revised: 04/12/2013 Document Reviewed: 02/01/2013 [facility] Patient Information 2014 [place], [place].  Patellar Dislocation A patellar dislocation occurs when your kneecap (patella) slips out of its normal position in a groove in front of the lower end of your thighbone (femur). This groove is called the patellofemoral groove.  CAUSES The kneecap is normally positioned over the front of the knee joint at the base of the thighbone. A kneecap can be dislocated when:  The kneecap is out of  place (patellar tracking disorder), and force is applied.  The foot is firmly planted pointing outward, and the knee bends with the thigh turned inward. This kind of injury is common during many sports activities.  The inner edge of the kneecap is hit, pushing it toward the outer side of the leg. SIGNS AND SYMPTOMS  Severe pain.  A misshapen knee that looks like a bone is out of position.  A popping sensation, followed by a feeling that something is out of place.  Inability to bend or straighten the knee.  Knee swelling.  Cool, pale skin or numbness and tingling in or below the affected knee. DIAGNOSIS  Your health care provider will physically examine the injured area. An  X-ray exam may be done to make sure a bone fracture has not occurred. In some cases, your health care provider may look inside your knee joint with an instrument much like a pencil-sized telescope (arthroscope). This may be done to make sure you have no loose cartilage in your joint. Loose cartilage is not visible on an X-ray image. TREATMENT  In many instances, the patella can be guided back into position without much difficulty. It often goes back into position by straightening the leg. Often, nothing more may be needed other than a brief period of immobilization followed by the exercises your health care provider recommends. If patellar dislocation starts to become frequent after the first incident, surgery may be needed to prevent your patella from slipping out of place. HOME CARE INSTRUCTIONS   Only take over-the-counter or prescription medicines for pain, discomfort, or fever as directed by your health care provider.  Use a knee brace if directed to do so by your health care provider.  Use crutches as instructed.  Apply ice to the injured knee:  Put ice in a plastic bag.  Place a towel between your skin and the bag.  Leave the ice on for 20 minutes, 2 3 times a day.  Follow your health care provider's  instructions for doing any recommended range-of-motion exercises or other exercises. SEEK IMMEDIATE MEDICAL CARE IF:  You have increased pain or swelling in the knee that is not relieved with medicine.  You have increasing inflammation in the knee.  You have locking or catching of your knee. MAKE SURE YOU:  Understand these instructions.  Will watch your condition.  Will get help right away if you are not doing well or get worse. Document Released: 03/17/2001 Document Revised: 04/12/2013 Document Reviewed: 02/01/2013 [facility] Patient Information 2014 [place], [place]. RICE: Routine Care for Injuries The routine care of many injuries includes Rest, Ice, Compression, and Elevation (RICE). HOME CARE INSTRUCTIONS  Rest is needed to allow your body to heal. Routine activities can usually be resumed when comfortable. Injured tendons and bones can take up to 6 weeks to heal. Tendons are the cord-like structures that attach muscle to bone.  Ice following an injury helps keep the swelling down and reduces pain.  Put ice in a plastic bag.  Place a towel between your skin and the bag.  Leave the ice on for 15-20 minutes, 03-04 times a day. Do this while awake, for the first 24 to 48 hours. After that, continue as directed by your caregiver.  Compression helps keep swelling down. It also gives support and helps with discomfort. If an elastic bandage has been applied, it should be removed and reapplied every 3 to 4 hours. It should not be applied tightly, but firmly enough to keep swelling down. Watch fingers or toes for swelling, bluish discoloration, coldness, numbness, or excessive pain. If any of these problems occur, remove the bandage and reapply loosely. Contact your caregiver if these problems continue.  Elevation helps reduce swelling and decreases pain. With extremities, such as the arms, hands, legs, and feet, the injured area should be placed near or above the level of the heart, if  possible. SEEK IMMEDIATE MEDICAL CARE IF:  You have persistent pain and swelling.  You develop redness, numbness, or unexpected weakness.  Your symptoms are getting worse rather than improving after several days. These symptoms may indicate that further evaluation or further X-rays are needed. Sometimes, X-rays may not show a small broken bone (fracture) until 1 week  or 10 days later. Make a follow-up appointment with your caregiver. Ask when your X-ray results will be ready. Make sure you get your X-ray results. Document Released: 10/04/2000 Document Revised: 09/14/2011 Document Reviewed: 11/21/2010 [facility] CarolinaExitCare Patient Information 2014 [place]ExitCare, [place].

## 2013-10-30 NOTE — ED Provider Notes (Signed)
TIME SEEN: 7:15 PM  CHIEF COMPLAINT: Fall, left ankle and knee pain  HPI: Patient is a 73 y.o. F with history of hypertension, fibromyalgia, IBS and a prior bilateral patellar dislocations who presents emergency department with a mechanical fall today with left ankle and knee pain. She reports that she dropped an ice cube on the floor earlier this evening and went back into the kitchen and slipped on water. Her left lower extremity went underneath her and she had pain of her left knee and ankle. She denies any head injury. She is on anticoagulation. She denies chest pain, shortness of breath, abdominal pain. She is having lower back pain. No numbness or focal weakness. She denies any other symptoms that preceded her fall. She states that she felt her patella dislocate but it spontaneously reduced but she is having significant swelling and pain. Unable to bear weight.  ROS: See HPI Constitutional: no fever  Eyes: no drainage  ENT: no runny nose   Cardiovascular:  no chest pain  Resp: no SOB  GI: no vomiting GU: no dysuria Integumentary: no rash  Allergy: no hives  Musculoskeletal: no leg swelling  Neurological: no slurred speech ROS otherwise negative  PAST MEDICAL HISTORY/PAST SURGICAL HISTORY:  Past Medical History  Diagnosis Date  . Fibromyalgia   . IBS (irritable bowel syndrome)   . Diverticulosis   . Hypertension     MEDICATIONS:  Prior to Admission medications   Medication Sig Start Date End Date Taking? Authorizing Provider  calcium carbonate (TUMS - DOSED IN MG ELEMENTAL CALCIUM) 500 MG chewable tablet Chew 1 tablet by mouth 2 (two) times daily.    Historical Provider, MD  cetirizine (ZYRTEC) 10 MG tablet Take 10 mg by mouth daily.    Historical Provider, MD  conjugated estrogens (PREMARIN) vaginal cream Place vaginally daily.    Historical Provider, MD  Dexlansoprazole (DEXILANT) 30 MG capsule Take 60 mg by mouth daily.    Historical Provider, MD  Esterified Estrogens  (MENEST) 0.3 MG tablet Take 1 tablet (0.3 mg total) by mouth daily. 05/04/13   Elease Hashimoto Rolen-Grubb, FNP  hydrochlorothiazide (HYDRODIURIL) 25 MG tablet Take 25 mg by mouth daily.    Historical Provider, MD  hyoscyamine (LEVBID) 0.375 MG 12 hr tablet Take 0.375 mg by mouth every 12 (twelve) hours as needed for cramping.    Historical Provider, MD  montelukast (SINGULAIR) 10 MG tablet Take 10 mg by mouth at bedtime.    Historical Provider, MD  nystatin-triamcinolone ointment (MYCOLOG) Apply topically 2 (two) times daily. 01/26/13   Lauro Franklin, FNP  polycarbophil (FIBERCON) 625 MG tablet Take 625 mg by mouth daily.    Historical Provider, MD  potassium gluconate 595 MG TABS Take 595 mg by mouth daily.    Historical Provider, MD  propranolol (INDERAL) 40 MG tablet Take 40 mg by mouth 2 (two) times daily.    Historical Provider, MD  sodium chloride (OCEAN NASAL SPRAY) 0.65 % nasal spray Place 1 spray into the nose as needed for congestion.    Historical Provider, MD  vitamin B-12 (CYANOCOBALAMIN) 100 MCG tablet Take 50 mcg by mouth daily.    Historical Provider, MD    ALLERGIES:  Allergies  Allergen Reactions  . Codeine   . Demerol [Meperidine] Nausea And Vomiting  . Provera [Medroxyprogesterone Acetate]   . Sulfa Antibiotics     SOCIAL HISTORY:  History  Substance Use Topics  . Smoking status: Never Smoker   . Smokeless tobacco: Never Used  . Alcohol  Use: No    FAMILY HISTORY: Family History  Problem Relation Age of Onset  . Heart failure Mother   . Ulcers Mother   . Hypertension Mother   . Pleurisy Father   . Cancer Father   . Cancer Maternal Aunt     uterine cancer  . Cancer Paternal Aunt     lymphoma  . Diabetes Maternal Grandmother   . Diverticulitis Son   . Diverticulitis Daughter     EXAM: BP 167/116  Pulse 79  Temp(Src) 98.3 F (36.8 C) (Oral)  Resp 20  SpO2 98% CONSTITUTIONAL: Alert and oriented and responds appropriately to questions.  Well-appearing; well-nourished; GCS 15 HEAD: Normocephalic; atraumatic EYES: Conjunctivae clear, PERRL, EOMI ENT: normal nose; no rhinorrhea; moist mucous membranes; pharynx without lesions noted; no dental injury; no septal hematoma NECK: Supple, no meningismus, no LAD; no midline spinal tenderness, step-off or deformity CARD: RRR; S1 and S2 appreciated; no murmurs, no clicks, no rubs, no gallops RESP: Normal chest excursion without splinting or tachypnea; breath sounds clear and equal bilaterally; no wheezes, no rhonchi, no rales; chest wall stable, nontender to palpation ABD/GI: Normal bowel sounds; non-distended; soft, non-tender, no rebound, no guarding PELVIS:  stable, nontender to palpation BACK:  The back appears normal and is non-tender to palpation, there is no CVA tenderness; no midline spinal tenderness, step-off or deformity EXT: Patient has tenderness to palpation over the lateral malleolus with no ecchymosis or swelling, there is ecchymosis to the medial left ankle, no ligamentous laxity of the left ankle, 2+ DP pulses bilaterally, sensation to light touch intact diffusely, patient has significant left knee joint effusion and suprapatellar swelling but is able to fully extend her knee. She is tender to palpation over the superior aspect of the patella but no signs of dislocation. Patient has some mild tenderness to palpation over the left lateral hip but has no leg length discrepancy and has normal range of motion in the left hip and no pain with internal or external rotation. Otherwise she has Normal ROM in all joints; non-tender to palpation; no edema; normal capillary refill; no cyanosis    SKIN: Normal color for age and race; warm NEURO: Moves all extremities equally, cranial nerves II through XII intact, sensation to light touch intact diffusely PSYCH: The patient's mood and manner are appropriate. Grooming and personal hygiene are appropriate.  MEDICAL DECISION MAKING: Patient  here with mechanical fall. We'll obtain plain films of her lumbar spine, left hip, left knee and ankle. We'll give pain medication.  ED PROGRESS: Patient's left hip has no signs of abnormality. Her lumbar spine is also normal. Her left ankle shows an oblique nondisplaced fracture of the left distal fibula. Patient also has a osteochondral fracture of the superior pole of the patella but no persistent dislocation. I am unable to test ligamentous laxity of her left knee given her pain and she may have had ligamentous injury. I am not concerned that she is having knee dislocation or other patella or dislocation. She is neurovascularly intact distally. We'll place her in a splint for her ankle fracture and a knee immobilizer. We'll have her followup with her orthopedist. Have discussed strict return precautions and supportive care instructions including rest, elevation, ice. Patient verbalizes understanding and is comfortable with this plan.   SPLINT APPLICATION Date/Time: 9:16 PM Authorized by: Layla MawKristen N Estephani Popper Consent: Verbal consent obtained. Risks and benefits: risks, benefits and alternatives were discussed Consent given by: patient Splint applied by: orthopedic technician Location details: Left  knee, left ankle  Splint type: Left knee immobilizer, left posterior ankle splint with stirrups  Supplies used: Knee immobilizer and fiberglass  Post-procedure: The splinted body part was neurovascularly unchanged following the procedure. Patient tolerance: Patient tolerated the procedure well with no immediate complications.     Layla MawKristen N Kahle Mcqueen, DO 10/30/13 [id number]

## 2013-11-10 ENCOUNTER — Ambulatory Visit: Payer: Medicare Other | Admitting: Podiatrist

## 2013-11-17 ENCOUNTER — Ambulatory Visit: Payer: Medicare Other | Admitting: Podiatrist

## 2013-12-22 ENCOUNTER — Ambulatory Visit (INDEPENDENT_AMBULATORY_CARE_PROVIDER_SITE_OTHER): Payer: Medicare Other | Admitting: Podiatrist

## 2013-12-22 ENCOUNTER — Encounter: Payer: Self-pay | Admitting: Podiatrist

## 2013-12-22 DIAGNOSIS — B351 Tinea unguium: Secondary | ICD-10-CM

## 2013-12-22 NOTE — Progress Notes (Signed)
I AM HERE FOR MY LASER TREATMENT  Laserering of Toenails  carried out at today's visit via Q-switch YAG laser by [employer] Laser at continuous Patient and staff were wearing appropriate laser protective goggles/eyewear Laser device was tested prior to use and safety protocols were followed Frequency of 5 Hz, Level 4, Joules 7.5 delivered.        The patient tolerated the lasering well and will call if any concerns arise.  i also debrided a callus from her left foot that came about after having to wear a cast for an ankle injury. She is now in a wheelchair and recovering slowly

## 2014-03-09 ENCOUNTER — Ambulatory Visit: Payer: Medicare Other | Admitting: Podiatrist

## 2014-03-09 DIAGNOSIS — B351 Tinea unguium: Secondary | ICD-10-CM

## 2014-03-12 NOTE — Progress Notes (Signed)
Mrs. Veronica Franklin presents today for further lasering of mycotic toenail- Left first.  She was in a cast for an ankle injury and is now finally out of the cast and wheelchair.    Laserering of Toenail-- left first  carried out at today's visit via Q-switch YAG laser by QClear Laser at continuous  Patient and staff were wearing appropriate laser protective goggles/eyewear  Laser device was tested prior to use and safety protocols were followed  Frequency of 5 Hz, Level 4, Joules 7.5 delivered.  The patient tolerated the lasering well and will call if any concerns arise.  It is a slow growing toenail and we laser it every 2 months

## 2014-03-30 ENCOUNTER — Telehealth: Payer: Self-pay | Admitting: Obstetrics & Gynecology

## 2014-03-30 NOTE — Telephone Encounter (Signed)
Yes.  This is fine.  Thanks.  Encounter closed. 

## 2014-03-30 NOTE — Telephone Encounter (Signed)
Pt was diagnosed with a Seroma at her pcp office and would like to see Dr Hyacinth Meeker.

## 2014-03-30 NOTE — Telephone Encounter (Signed)
Spoke with patient. Patient states that she fell in April and dislocated her knee. Since then has been under "constant doctor supervision." States that she has discussed bladder prolapse with Lauro Franklin, FNP and Dr.Miller but did not want to be put to sleep for surgery. Since knee injury has recently changed her mind. Three weeks ago when patient was using the restroom felt like she was "sitting on my hip. So I took a look to see what was going on and I saw what looked like a sac on my left side on my bottom." Patient was seen with PCP who diagnosed her with a seroma and advised her to see orthopedist or surgeon. 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 Offered appointment on Oct 9th at 2:30pm but patient declines.Appointment scheduled for Oct 19th at 2:30pm.  Okay for patient to wait until Oct 19th to be seen?

## 2014-04-23 ENCOUNTER — Encounter: Payer: Self-pay | Admitting: Obstetrics & Gynecology

## 2014-04-23 ENCOUNTER — Ambulatory Visit (INDEPENDENT_AMBULATORY_CARE_PROVIDER_SITE_OTHER): Payer: Medicare Other | Admitting: Obstetrics & Gynecology

## 2014-04-23 VITALS — BP 100/60 | HR 72 | Resp 18 | Ht 65.0 in | Wt 144.0 lb

## 2014-04-23 DIAGNOSIS — IMO0002 Reserved for concepts with insufficient information to code with codable children: Secondary | ICD-10-CM

## 2014-04-23 DIAGNOSIS — N816 Rectocele: Secondary | ICD-10-CM

## 2014-04-23 DIAGNOSIS — L729 Follicular cyst of the skin and subcutaneous tissue, unspecified: Secondary | ICD-10-CM

## 2014-04-23 DIAGNOSIS — N8110 Cystocele, unspecified: Secondary | ICD-10-CM

## 2014-04-23 NOTE — Progress Notes (Signed)
Ultrasound scheduled for 04-26-14 at 430 in office. Patient agreeable to date and time.

## 2014-04-23 NOTE — Progress Notes (Signed)
Subjective:     Patient ID: Veronica Franklin, female   DOB: 20-Apr-1941, 73 y.o.   MRN: [id number]  HPI 73 yo G3P3 MWF here for discussion of cystocele and rectocele.  This is not new.  Pt has been dealing with this, without a lot of symptoms.  This has changed over the last several months.    Reports she slipped in April on water in her kitchen.  Ended up with broken right ankle and then subsequent knee issues.  Had knee surgery by Dr. Madelon Lipsaffrey.  She was resting for many months.  Reports from April to July, she was basically on bedrest.   Feels symptoms are now worse since she had all of the bed rest.   She noticed about 6-8 weeks ago she was having some discomfort with sitting and she noticed a "bulge" on her buttocks.  She saw Dr. Tenny Crawoss, PCP, in a late evening clinic.  He feels it is a seroma and may need drainage.  It does cause discomfort with sitting.  Pt would like to consider and wants my opinion.  From pelvic prolapse standpoint, pt is doing about the same.  She continues using a pessary with good success.  She states if she is going to have to do something surgical about the "seroma" then she wants to treat the prolapse at the same time.  We discussed the very big difference in treating the possible seroma versus surgical correction of pelvic relaxation requiring pessary use.  She is still interested in considering, if and only if, she has to go to the operating room for surgical intervention of the "seroma".  Review of Systems  All other systems reviewed and are negative.      Objective:   Physical Exam  Constitutional: She is oriented to person, place, and time. She appears well-developed and well-nourished.  Genitourinary:    There is no rash, tenderness or lesion on the right labia. There is no rash, tenderness or lesion on the left labia.  Vaginal mucosa without findings.  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  Pessary not in  placed.  No significant change since my last exam of pt.  Uterus and adnexal surgically absent.  No masses on physical exam.  Lymphadenopathy:       Right: No inguinal adenopathy present.       Left: No inguinal adenopathy present.  Neurological: She is alert and oriented to person, place, and time.  Skin: Skin is warm and dry.  Psychiatric: She has a normal mood and affect.       Assessment:     Buttocks cyst/fluctuant mass/seroma Cystocele and rectocele with pessary use     Plan:     Plan PUS to assess lesion on buttocks.  May consider drainage same day.  Pt comfortable with plan.

## 2014-04-25 ENCOUNTER — Encounter: Payer: Self-pay | Admitting: Obstetrics & Gynecology

## 2014-04-25 DIAGNOSIS — L729 Follicular cyst of the skin and subcutaneous tissue, unspecified: Secondary | ICD-10-CM | POA: Insufficient documentation

## 2014-04-25 DIAGNOSIS — N8110 Cystocele, unspecified: Secondary | ICD-10-CM | POA: Insufficient documentation

## 2014-04-25 DIAGNOSIS — IMO0002 Reserved for concepts with insufficient information to code with codable children: Secondary | ICD-10-CM | POA: Insufficient documentation

## 2014-04-25 DIAGNOSIS — N816 Rectocele: Secondary | ICD-10-CM | POA: Insufficient documentation

## 2014-04-26 ENCOUNTER — Other Ambulatory Visit: Payer: Self-pay | Admitting: Obstetrics & Gynecology

## 2014-04-26 ENCOUNTER — Ambulatory Visit (INDEPENDENT_AMBULATORY_CARE_PROVIDER_SITE_OTHER): Payer: Medicare Other

## 2014-04-26 ENCOUNTER — Ambulatory Visit (INDEPENDENT_AMBULATORY_CARE_PROVIDER_SITE_OTHER): Payer: Medicare Other | Admitting: Obstetrics & Gynecology

## 2014-04-26 VITALS — BP 116/72 | Ht 65.0 in | Wt 142.0 lb

## 2014-04-26 DIAGNOSIS — IMO0002 Reserved for concepts with insufficient information to code with codable children: Secondary | ICD-10-CM

## 2014-04-26 DIAGNOSIS — N8110 Cystocele, unspecified: Secondary | ICD-10-CM

## 2014-04-26 DIAGNOSIS — S381XXA Crushing injury of abdomen, lower back, and pelvis, initial encounter: Secondary | ICD-10-CM

## 2014-04-26 NOTE — Progress Notes (Signed)
73 yo G3P3 MWF here for ultrasound of fluctuant lesion on buttocks that I feel resulted from a fall.  I feel she had a deep contusion and has now begun to liquify and is under the skin.  I wanted to look at this with ultrasound before trying to aspirate any fluid.  Ultrasound:  Lesion does appear to be fluid filled, 45 x 20 x 41mm with a small soft tissue mass within the lesion.  Using sterile technique, the skin was cleansed with Betadine.  Using a #23 1.5 in needle, the lesion was entered and 35cc bloody tinged fluid was aspirated.    Post procedure u/s showed lesion decompress with minimal fluid remaining.  Pt tolerated procedure well.  Assessment:  Buttocks mass from probable crush injury Plan:  Recheck at AEX.  May need repeat aspiration then as well. Fluid sent for cytology analysis

## 2014-05-01 LAB — CYTOLOGY, FLUID (SOLSTAS)

## 2014-05-03 ENCOUNTER — Telehealth: Payer: Self-pay | Admitting: *Deleted

## 2014-05-03 NOTE — Telephone Encounter (Signed)
Patient notified of results and recommendation to adjust annual exam schedule. AEX moved to 07-24-14 with [name] at a time when Dr Hyacinth Meekermiller should be in office. Patient agreeable to plan.  Encounter closed.

## 2014-05-03 NOTE — Telephone Encounter (Signed)
Message copied by Alisa GraffYEAKLEY, Kaydyn Sayas on Thu May 03, 2014 10:49 AM ------      Message from: Jerene BearsMILLER, MARY S      Created: Wed May 02, 2014  8:11 AM       Please inform pt that the cyst fluid that was aspirated showed nothing abnormal--just blood.  Cyst was on her buttocks and I think is from a fall.  She has an AEX with PG scheduled 05/08/14.  This should be changed to an AEX in 3 months.  OK to see patty but make sure is a day I will be in the office so I can look at her as well.  Thanks. ------

## 2014-05-04 ENCOUNTER — Encounter: Payer: Self-pay | Admitting: Podiatrist

## 2014-05-04 ENCOUNTER — Ambulatory Visit (INDEPENDENT_AMBULATORY_CARE_PROVIDER_SITE_OTHER): Payer: Medicare Other | Admitting: Podiatrist

## 2014-05-04 DIAGNOSIS — B351 Tinea unguium: Secondary | ICD-10-CM

## 2014-05-07 ENCOUNTER — Encounter: Payer: Self-pay | Admitting: Podiatrist

## 2014-05-08 ENCOUNTER — Encounter: Payer: Self-pay | Admitting: Nurse Practitioner

## 2014-05-08 ENCOUNTER — Ambulatory Visit (INDEPENDENT_AMBULATORY_CARE_PROVIDER_SITE_OTHER): Payer: Medicare Other | Admitting: Nurse Practitioner

## 2014-05-08 ENCOUNTER — Encounter: Payer: Self-pay | Admitting: Obstetrics & Gynecology

## 2014-05-08 ENCOUNTER — Ambulatory Visit: Payer: Medicare Other | Admitting: Nurse Practitioner

## 2014-05-08 ENCOUNTER — Telehealth: Payer: Self-pay | Admitting: Obstetrics & Gynecology

## 2014-05-08 VITALS — BP 120/60 | HR 60 | Ht 65.0 in | Wt 143.0 lb

## 2014-05-08 DIAGNOSIS — S381XXD Crushing injury of abdomen, lower back, and pelvis, subsequent encounter: Secondary | ICD-10-CM

## 2014-05-08 MED ORDER — CEPHALEXIN 500 MG PO CAPS: 500.0000 mg | ORAL_CAPSULE | Freq: Four times a day (QID) | ORAL | Status: DC

## 2014-05-08 MED ORDER — FLUCONAZOLE 150 MG PO TABS: 150.0000 mg | ORAL_TABLET | Freq: Once | ORAL | Status: DC

## 2014-05-08 NOTE — Progress Notes (Signed)
Patient ID: Veronica Franklin, female   DOB: 1940-08-25, 73 y.o.   MRN: [id number] S: This 73 yo WM Fe presents with a history of a fall that occurred October 30, 2013.  She had dislocated the left knee and fracture left ankle.  She then had a cast for 6 weeks. After the ankle healed she then had a left knee scope on 6/10 with repair of meniscus. After 7/3 there was a 'ball' feeling while sitting and discomfort of left hip.  Then after first of September she noted a hematoma of left hip.  She then saw PCP at evening clinic.  Then on 10/22 had ultrasound guided aspiration of 35 cc bloody tinged fluid.   Cytology of fluid was negative. She then still had some 'ball' of discomfort at the hip but was better.  This morning felt harder and more tender and at one spot looked like a cyst.  She calls and comes in for evaluation. Denies fever or chills.  O: Left buttock with a small round 2 cm wound with exudate that is greenish and blood tinged.  Wound culture is done. Only slight induration.  Not sure if this was the site of aspiration and have asked Dr. Hyacinth MeekerMiller to see patient as well.  A: Wound with drainage post aspiration.  History of crush injury to left lower extremity and hip 10/30/13  Plan:  Per Dr. Hyacinth MeekerMiller patient is started on Keflex 500 mg QID  Will follow with wound culture  She is due for AEX in near future and will follow

## 2014-05-08 NOTE — Telephone Encounter (Signed)
Spoke with patient. She feels there is new area of redness near area that was aspirated. Patient states this is more external than internal. Feels it may be r/t to placement of drainage. Describes area as nickel sized and red that is causing her pain. No fevers.   Scheduled office visit with Lauro FranklinPatricia Rolen-Grubb, FNP for evaluation. Dr. Hyacinth MeekerMiller will be in office at this time as well today.  Routing to provider for final review. Patient agreeable to disposition. Will close encounter

## 2014-05-08 NOTE — Progress Notes (Signed)
  Mrs. Veronica Franklin presents today for further lasering of mycotic toenail- Left first.  Laserering of Toenail-- left first carried out at today's visit via Q-switch YAG laser by [employer] at continuous  Patient and staff were wearing appropriate laser protective goggles/eyewear  Laser device was tested prior to use and safety protocols were followed  Frequency of 5 Hz, Level 4, Joules 7.5 delivered.  The patient tolerated the lasering well and will call if any concerns arise.  It is a slow growing toenail and we laser it every 2 months

## 2014-05-08 NOTE — Telephone Encounter (Signed)
Pt says she was here to have fluid removed from a seroma a few weeks ago and having problems. Says it's painful and hard.

## 2014-05-09 ENCOUNTER — Other Ambulatory Visit: Payer: Self-pay | Admitting: Nurse Practitioner

## 2014-05-09 NOTE — Telephone Encounter (Addendum)
S/w pharmacist they were requesting a refill for the patient.  Last refilled: 05/04/13 #90/3 rfs by Ms. [name] Last Mammogram: 03/28/13 Bi-Rads 2: Benign Last AEX: 05/04/13 with Ms. [name]  Please advise.

## 2014-05-09 NOTE — Telephone Encounter (Signed)
[facility] left message on voicemail regarding a refill request for pt. [phone]

## 2014-05-10 ENCOUNTER — Encounter: Payer: Self-pay | Admitting: Obstetrics & Gynecology

## 2014-05-10 ENCOUNTER — Other Ambulatory Visit: Payer: Self-pay | Admitting: Nurse Practitioner

## 2014-05-10 LAB — WOUND CULTURE: Gram Stain: NONE SEEN

## 2014-05-10 MED ORDER — ESTERIFIED ESTROGENS 0.3 MG PO TABS: 0.3000 mg | ORAL_TABLET | Freq: Every day | ORAL | Status: DC

## 2014-05-10 NOTE — Telephone Encounter (Signed)
Routed to Ms. Debbie given Ms. Alexia Freestoneatty is out of office rest of the week.

## 2014-05-10 NOTE — Telephone Encounter (Signed)
Called patient she has AEX scheduled for 07/24/2014 with Ms. Alexia Freestoneatty has been in and out of the office patient states that when she was here 05/08/14 with Ms. [name], Dr. Hyacinth MeekerMiller told her that it's okay for her to wait 3 months for her AEX.   Dr. Hyacinth MeekerMiller please advise patient says she needs Menest sent in.  Okay to send in till AEX?  Correction Mammogram was 04/19/14 Bi-Rads 1: Negative (was scanned in today)

## 2014-05-10 NOTE — Telephone Encounter (Signed)
She needs appointment to continue

## 2014-05-10 NOTE — Telephone Encounter (Signed)
RF was done.  It is fine for the pt to be seen out in several months.

## 2014-05-11 NOTE — Telephone Encounter (Signed)
RX denied with pharmacy.  Pt was given refill by Dr. Hyacinth MeekerMiller on 05/10/14 in phone note dated 05/09/14.

## 2014-05-14 ENCOUNTER — Encounter: Payer: Self-pay | Admitting: Nurse Practitioner

## 2014-05-15 ENCOUNTER — Ambulatory Visit (INDEPENDENT_AMBULATORY_CARE_PROVIDER_SITE_OTHER): Payer: Medicare Other | Admitting: Obstetrics & Gynecology

## 2014-05-15 VITALS — BP 120/76 | HR 64 | Resp 16 | Wt 143.8 lb

## 2014-05-15 DIAGNOSIS — N764 Abscess of vulva: Secondary | ICD-10-CM

## 2014-05-15 NOTE — Progress Notes (Signed)
Reviewed personally.  M. Suzanne Keira Bohlin, MD.  

## 2014-05-17 ENCOUNTER — Telehealth: Payer: Self-pay | Admitting: *Deleted

## 2014-05-17 NOTE — Telephone Encounter (Signed)
-----   Message from Lauro FranklinPatricia Rolen-Grubb, FNP sent at 05/13/2014  7:24 PM EST ----- Let patient know that would culture showed staph and the antibiotic she took was correct one.  Get a progress report.

## 2014-05-17 NOTE — Telephone Encounter (Signed)
I have attempted to contact this patient by phone with the following results: left message to return call to Alexandria BayStephanie at [phone] answering machine ([facility]).  No personal information given.  [phone] (Home)

## 2014-05-27 ENCOUNTER — Encounter: Payer: Self-pay | Admitting: Obstetrics & Gynecology

## 2014-05-27 NOTE — Progress Notes (Signed)
Subjective:     Patient ID: Chapman MossMaryann C Knipp, female   DOB: 1940/07/22, 73 y.o.   MRN: [id number]  HPI 3273 you MWF here for follow up of vulvar lesion that was a small abscess.  Pt treated with Keflex.  Symptoms have resolved except she's had a little GI distress which she thinks if from the antibiotics. When seen by Shirlyn GoltzPatty Grubb, area was draining and wound culture was positive for Staph.  Reviewed with pt.  Feeling fine and glad it is better.  Review of Systems  All other systems reviewed and are negative.      Objective:   Physical Exam  Constitutional: She is oriented to person, place, and time. She appears well-developed and well-nourished.  Genitourinary: There is rash, tenderness, lesion and injury on the right labia. There is rash, tenderness, lesion and injury on the left labia.  Neurological: She is alert and oriented to person, place, and time.  Skin: Skin is warm and dry.  Psychiatric: She has a normal mood and affect.       Assessment:     Vulvar abscess, resolved    Plan:     Pt reassured.  If she has any new symptoms, she will call.

## 2014-06-05 NOTE — Telephone Encounter (Signed)
RC to patient.  Pt has been out of town and has been unable to return call.  Pt given results per result note (see result note). Pt will also drop off letter from insurance company regarding prior auth for Menest. Pt will discuss at AEX in January.

## 2014-06-05 NOTE — Telephone Encounter (Signed)
Pt called stating she is returning Stephanie's call. Judeth CornfieldStephanie unavailable at time of call.  Please call pt. [phone]  bf

## 2014-06-12 ENCOUNTER — Telehealth: Payer: Self-pay | Admitting: Nurse Practitioner

## 2014-06-12 NOTE — Telephone Encounter (Signed)
[facility] prior authorization calling to request a DX code for [employer].

## 2014-06-12 NOTE — Telephone Encounter (Signed)
Spoke with [facility] to provide ICD 10 code. Per representative PA for Menest is pending pharmacist review. Unable to find any notes about any further information needed. Will refax PA form with ICD 10 code for patient for pharmacist review. Representative is agreeable. PA faxed to [facility] at [phone] with cover sheet and fax confirmation.  Routing to provider for final review. Patient agreeable to disposition. Will close encounter

## 2014-06-13 ENCOUNTER — Telehealth: Payer: Self-pay

## 2014-06-13 NOTE — Telephone Encounter (Signed)
Spoke with Judeth CornfieldStephanie from [facility] at time of incoming call. Judeth CornfieldStephanie is calling in regards to PA for Menest 0.3 mg tab. Would like to know if patients has tried and failed Citalopram, Fluoxetine, Gabapentin, Venlafaxine, or Estring. Advised patient has not tried these medications. Has used Cenestin, Premarin Vaginal cream, and Premarin 0.625 mg. Judeth CornfieldStephanie inquiring as to why patient is not able to use above alternatives. Judeth CornfieldStephanie will send fax for Lauro FranklinPatricia Rolen-Grubb, FNP review and advise.

## 2014-06-13 NOTE — Telephone Encounter (Signed)
The other choices of treatment have CNS side effects that would not be safe for this patient.

## 2014-06-13 NOTE — Telephone Encounter (Signed)
New form for PA filled out. Placed with paper chart in basket outside Lauro FranklinPatricia Rolen-Grubb, FNP door. Please review and advise for fax.

## 2014-06-14 NOTE — Telephone Encounter (Signed)
PA filled out with information from [place] Rolen[place]Grub[place], FNP as seen below. Faxed to [facility] with cover sheet and confirmation.  Routing to provider for final review. Patient agreeable to disposition. Will close encounter

## 2014-07-04 ENCOUNTER — Telehealth: Payer: Self-pay | Admitting: Nurse Practitioner

## 2014-07-13 ENCOUNTER — Encounter: Payer: Self-pay | Admitting: Podiatrist

## 2014-07-13 ENCOUNTER — Ambulatory Visit (INDEPENDENT_AMBULATORY_CARE_PROVIDER_SITE_OTHER): Payer: 59 | Admitting: Podiatrist

## 2014-07-13 DIAGNOSIS — B351 Tinea unguium: Secondary | ICD-10-CM

## 2014-07-13 MED ORDER — TAVABOROLE 5 % EX SOLN: 1.0000 [drp] | CUTANEOUS | Status: DC

## 2014-07-13 NOTE — Progress Notes (Signed)
  Mrs. Veronica Franklin presents today for further lasering of mycotic toenail- Left first. there has been minimal improvement in the appearance and growth of the toenails.     Laserering of Toenail-- left first carried out at today's visit via Q-switch YAG laser by [facility] at continuous  Patient and staff were wearing appropriate laser protective goggles/eyewear  Laser device was tested prior to use and safety protocols were followed  Frequency of 5 Hz, Level 4, Joules 7.5 delivered.  The patient tolerated the lasering well and will call if any concerns arise.  It is a slow growing toenail and we laser it every 2 months -- recommended Keradin topical which was called in for her.

## 2014-07-24 ENCOUNTER — Encounter: Payer: Self-pay | Admitting: Nurse Practitioner

## 2014-07-24 ENCOUNTER — Ambulatory Visit (INDEPENDENT_AMBULATORY_CARE_PROVIDER_SITE_OTHER): Payer: 59 | Admitting: Nurse Practitioner

## 2014-07-24 VITALS — BP 106/72 | HR 80 | Ht 64.5 in | Wt 146.0 lb

## 2014-07-24 DIAGNOSIS — Z01419 Encounter for gynecological examination (general) (routine) without abnormal findings: Secondary | ICD-10-CM

## 2014-07-24 DIAGNOSIS — J0120 Acute ethmoidal sinusitis, unspecified: Secondary | ICD-10-CM

## 2014-07-24 MED ORDER — ESTRADIOL 0.5 MG PO TABS: 0.5000 mg | ORAL_TABLET | Freq: Every day | ORAL | Status: DC

## 2014-07-24 MED ORDER — AZITHROMYCIN 250 MG PO TABS: ORAL_TABLET | ORAL | Status: DC

## 2014-07-24 NOTE — Progress Notes (Signed)
Patient ID: Veronica Franklin, female   DOB: Jul 19, 1940, 74 y.o.   MRN: [id number] 74 y.o. G3P3 Divorced  Caucasian Fe here for annual exam.  History of fall 10/30/13 with injury to left knee and fracture of left ankle.  After that noted a hematoma left hip with US guided aspiration 04/26/14.  She was doing well. Then on 06/30/14 fell again on left hip and arm and did have bruising.  No fracture.  Able to walk afterwards.  She has leg cramps.  Some sinusitis symptoms for over a week would like a Z pack.  Patient's last menstrual period was 12/05/1975.          Sexually active: No.  The current method of family planning is status post hysterectomy.    Exercising: No.  The patient does not participate in regular exercise at present. Smoker:  no  Health Maintenance: Pap:  12/13/08, negative  MMG:  04/19/14, Bi-Rads 1:  Negative  Colonoscopy:  09/30/12, repeat in 10 years BMD:   03/28/13, low bone mass, no second page TDaP:  12/13/10 Labs: PCP   reports that she has never smoked. She has never used smokeless tobacco. She reports that she does not drink alcohol or use illicit drugs.  Past Medical History  Diagnosis Date  . Fibromyalgia   . IBS (irritable bowel syndrome)   . Diverticulosis   . Hypertension   . Left fibular fracture 10/2013  . Patellar fracture     Left  . Patellar dislocation 10/2013    Left    Past Surgical History  Procedure Laterality Date  . Bilateral salpingoophorectomy  07/1998    Endometrosis and LOA  . Cholecystectomy  02/2004  . Colonoscopy    . Umbilical hernia repair  07/1998  . Thigh lipoma Right     removed  . Breast biopsy Bilateral 3/10  . Breast biopsy Right 1959  . Breast biopsy Right 1970's & 1981  . Vaginal hysterectomy  1977    Current Outpatient Prescriptions  Medication Sig Dispense Refill  . acetaminophen (TYLENOL) 500 MG tablet Take 1,000 mg by mouth every 6 (six) hours as needed for moderate pain or headache.    . Calcium Carbonate-Vitamin D  (CALCIUM + D PO) Take 1 tablet by mouth daily.    . cephALEXin (KEFLEX) 500 MG capsule Take 1 capsule (500 mg total) by mouth 4 (four) times daily. Take QID for 7 days. 28 capsule 0  . cetirizine (ZYRTEC) 10 MG tablet Take 10 mg by mouth at bedtime.     . ciclesonide (OMNARIS) 50 MCG/ACT nasal spray Place 2 sprays into both nostrils daily.    Marland Kitchen. Dexlansoprazole (DEXILANT) 30 MG capsule Take 30 mg by mouth daily.     . Esterified Estrogens (MENEST) 0.3 MG tablet Take 1 tablet (0.3 mg total) by mouth daily. 90 tablet 0  . fluconazole (DIFLUCAN) 150 MG tablet Take 1 tablet (150 mg total) by mouth once. Take one tablet.  Repeat in 48 hours if symptoms are not completely resolved. 2 tablet 1  . fluocinonide cream (LIDEX) 0.05 % Apply 1 application topically 2 (two) times daily.    . hydrochlorothiazide (HYDRODIURIL) 25 MG tablet Take 12.5 mg by mouth daily.     . hyoscyamine (LEVBID) 0.375 MG 12 hr tablet Take 0.375 mg by mouth every 12 (twelve) hours as needed for cramping.    Marland Kitchen. levocetirizine (XYZAL) 5 MG tablet Take 5 mg by mouth every evening.    Gaylene Brooks. Linaclotide (LINZESS) 145  MCG CAPS capsule Take 145 mcg by mouth daily.    Marland Kitchen loratadine (ALLERGY RELIEF) 10 MG tablet Take 10 mg by mouth daily.    . meloxicam (MOBIC) 15 MG tablet Take 15 mg by mouth daily.    . montelukast (SINGULAIR) 10 MG tablet Take 10 mg by mouth daily.     . ondansetron (ZOFRAN) 4 MG tablet Take 1 tablet (4 mg total) by mouth every 6 (six) hours. 30 tablet 0  . oxyCODONE-acetaminophen (PERCOCET/ROXICET) 5-325 MG per tablet Take 1-2 tablets by mouth every 6 (six) hours as needed. 25 tablet 0  . potassium gluconate 595 MG TABS Take 595 mg by mouth daily.    . propranolol (INDERAL) 40 MG tablet Take 40 mg by mouth 2 (two) times daily.    . pseudoephedrine (SUDAFED) 30 MG/5ML syrup Take 30 mg by mouth 4 (four) times daily as needed for congestion.    . sodium chloride (OCEAN NASAL SPRAY) 0.65 % nasal spray Place 1 spray into the nose  as needed for congestion.    . Tavaborole (KERYDIN) 5 % SOLN Apply 1 drop topically 1 day or 1 dose. Apply 1 drop to the toenail daily. 1 Bottle 3  . vitamin B-12 (CYANOCOBALAMIN) 100 MCG tablet Take 50 mcg by mouth daily.     No current facility-administered medications for this visit.    Family History  Problem Relation Age of Onset  . Heart failure Mother   . Ulcers Mother   . Hypertension Mother   . Pleurisy Father   . Cancer Father   . Cancer Maternal Aunt     uterine cancer  . Cancer Paternal Aunt     lymphoma  . Diabetes Maternal Grandmother   . Diverticulitis Son   . Diverticulitis Daughter     ROS:  Pertinent items are noted in HPI.  Otherwise, a comprehensive ROS was negative.  Exam:   BP 106/72 mmHg  Pulse 80  Ht 5' 4.5" (1.638 m)  Wt 146 lb (66.225 kg)  BMI 24.68 kg/m2  LMP 12/05/1975 Height: 5' 4.5" (163.8 cm) Ht Readings from Last 3 Encounters:  07/24/14 5' 4.5" (1.638 m)  05/08/14  (1.651 m)  04/26/14  (1.651 m)    General appearance: alert, cooperative and appears stated age Head: Normocephalic, without obvious abnormality, atraumatic Neck: no adenopathy, supple, symmetrical, trachea midline and thyroid normal to inspection and palpation Lungs: clear to auscultation bilaterally Breasts: normal appearance, no masses or tenderness Heart: regular rate and rhythm Abdomen: soft, non-tender; no masses,  no organomegaly Extremities: extremities normal, atraumatic, no cyanosis or edema Skin: Skin color, texture, turgor normal. No rashes or lesions Lymph nodes: Cervical, supraclavicular, and axillary nodes normal. No abnormal inguinal nodes palpated Neurologic: Grossly normal   Pelvic: External genitalia:  no lesions              Urethra:  normal appearing urethra with no masses, tenderness or lesions              Bartholin's and Skene's: normal                 Vagina: normal appearing vagina with normal color and discharge, no lesions               Cervix: absent              Pap taken: No. 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

## 2014-07-24 NOTE — Patient Instructions (Signed)

## 2014-07-24 NOTE — Progress Notes (Signed)
Encounter reviewed by Dr. Lakeya Mulka Silva.  

## 2014-08-09 ENCOUNTER — Telehealth: Payer: Self-pay

## 2014-08-09 NOTE — Telephone Encounter (Signed)
[facility] to [facility] for patient's Estradiol 0.5mg  tablets. Prior authorization was done over the phone and is currently awaiting clinical review. Will wait faxed with prior authorization status.

## 2014-08-14 NOTE — Telephone Encounter (Signed)
Prior authorization for Estradiol 0.5mg  tablets has been denied by [facility]. Left message for patient to call Kruti Horacek at [phone]. Need to advise patient of denial and patient may get rx filled as cash pay for [facility]. Denial form sent to Lauro FranklinPatricia Rolen-Grubb, FNP for review and signature for fax.

## 2014-08-15 MED ORDER — ESTRADIOL 0.5 MG PO TABS: 0.5000 mg | ORAL_TABLET | Freq: Every day | ORAL | Status: DC

## 2014-08-15 NOTE — Telephone Encounter (Signed)
Left message to call Latwan Luchsinger at [phone]. 

## 2014-08-15 NOTE — Telephone Encounter (Signed)
Spoke with patient. Advised prior authorization for Estradiol 0.5mg  tablets has been denied by [facility]. Advised patient that Estradiol 0.5mg  tablets are $4 for 1 month supply [facility]. Patient is agreeable. [facility] so that she can pick up as cash pay. Estradiol 0.5mg  tablets #90 [facility]RF [facility] to [facility] on [address] off [address]. Patient is agreeable.  Routing to provider for final review. Patient agreeable to disposition. Will close encounter

## 2014-10-19 ENCOUNTER — Ambulatory Visit: Payer: 59 | Admitting: Podiatrist

## 2014-11-08 ENCOUNTER — Other Ambulatory Visit (HOSPITAL_COMMUNITY): Payer: Self-pay | Admitting: Dermatology

## 2014-11-21 ENCOUNTER — Ambulatory Visit (INDEPENDENT_AMBULATORY_CARE_PROVIDER_SITE_OTHER): Payer: Medicare Other | Admitting: Podiatrist

## 2014-11-21 ENCOUNTER — Encounter: Payer: Self-pay | Admitting: Podiatrist

## 2014-11-21 VITALS — BP 131/76 | HR 64 | Resp 12

## 2014-11-21 DIAGNOSIS — B351 Tinea unguium: Secondary | ICD-10-CM

## 2014-11-21 DIAGNOSIS — Z79899 Other long term (current) drug therapy: Secondary | ICD-10-CM

## 2014-11-21 MED ORDER — TERBINAFINE HCL 250 MG PO TABS: ORAL_TABLET | ORAL | Status: DC

## 2014-11-21 NOTE — Patient Instructions (Signed)

## 2014-11-26 NOTE — Progress Notes (Signed)
Patient presents for continued laser treatments of left hallux nail. She relates she has noticed minimal improvement after several treatments.    Objective:  Neurovascular status intact and unchanged.  Left hallux nail continues to have a mycotic apperance present with thickness, discoloration present.  Minimal improvement present at the base of the nail.    Assessment:  Mycotic nail  Plan:  Discussed that at this point, laser is not going to improve the appearance of nail any further than it has.  Discussed oral lamisil in a pulse dosing form.  She will look into taking this medication and will decide if she wants to treat her mycotic infection with an oral treatment.  I wrote her the rx and labs to have drawn prior to taking the medication.  She will call if any questions or concerns arise.

## 2014-12-05 ENCOUNTER — Ambulatory Visit (INDEPENDENT_AMBULATORY_CARE_PROVIDER_SITE_OTHER): Payer: Medicare Other | Admitting: Cardiology

## 2014-12-05 ENCOUNTER — Encounter: Payer: Self-pay | Admitting: Cardiology

## 2014-12-05 VITALS — BP 110/70 | HR 63 | Ht 64.5 in | Wt 149.0 lb

## 2014-12-05 DIAGNOSIS — I341 Nonrheumatic mitral (valve) prolapse: Secondary | ICD-10-CM

## 2014-12-05 DIAGNOSIS — R002 Palpitations: Secondary | ICD-10-CM | POA: Insufficient documentation

## 2014-12-05 NOTE — Patient Instructions (Signed)
Medication Instructions:  Your physician recommends that you continue on your current medications as directed. Please refer to the Current Medication list given to you today.   Labwork: None  Testing/Procedures: Your physician has requested that you have an echocardiogram. Echocardiography is a painless test that uses sound waves to create images of your heart. It provides your doctor with information about the size and shape of your heart and how well your heart's chambers and valves are working. This procedure takes approximately one hour. There are no restrictions for this procedure.   Your physician has recommended that you wear an event monitor. Event monitors are medical devices that record the heart's electrical activity. Doctors most often us these monitors to diagnose arrhythmias. Arrhythmias are problems with the speed or rhythm of the heartbeat. The monitor is a small, portable device. You can wear one while you do your normal daily activities. This is usually used to diagnose what is causing palpitations/syncope (passing out).  Follow-Up: Your physician recommends that you schedule a follow-up appointment AS NEEDED with Dr. Turner pending your study results.   Any Other Special Instructions Will Be Listed Below (If Applicable). 

## 2014-12-05 NOTE — Progress Notes (Signed)
Cardiology Office Note   Date:  12/05/2014   ID:  Veronica Franklin, DOB [birth year], MRN [id number]  PCP:   Veronica Lopeoss, Alan, MD    Chief Complaint  Patient presents with  . New Evaluation    palpitations and MVP      History of Present Illness: Veronica MossMaryann C Franklin is a 74 y.o. female who presents for evaluation of palpitations.  She has a history of mitral valve prolapse and had seen Dr. Deborah Chalkennant in the past.  She also has a history of asthma.  She says that recently her heart has been skipping some.  She has HTN and takes propranolol and HCTZ.  When she was first diagnosed with MVP she was having palpitations but they could never be picked up on an EKG.  She has never worn a heart monitor.  Her palpitations are very sporadic and nothing that she can think of triggers them.  It has been occurring for several months.  She drinks some caffeine.  She does not drink ETOH or take OTC decongestants.  The palpitations are very fleeting in duration and she denies any dizziness or syncope with them.  She denies any chest pain or pressure, SOB, DOE, PND, orthopnea.  She occasionally has some LE edema of her RLE after a tumor removal.      Past Medical History  Diagnosis Date  . Fibromyalgia   . IBS (irritable bowel syndrome)   . Diverticulosis   . Hypertension   . Left fibular fracture 10/2013  . Patellar fracture     Left  . Patellar dislocation 10/2013    Left  . Hyperlipidemia   . GERD (gastroesophageal reflux disease)   . MVP (mitral valve prolapse)     Past Surgical History  Procedure Laterality Date  . Bilateral salpingoophorectomy  07/10/1998    Endometrosis and LOA  . Cholecystectomy  02/2004  . Colonoscopy    . Umbilical hernia repair  07/10/1998    at same time of Ophorectomy  . Thigh lipoma Right     removed  . Breast biopsy Bilateral 3/10  . Breast biopsy Right 1959  . Breast biopsy Right 1970's & 1981  . Vaginal hysterectomy  1977     Current Outpatient  Prescriptions  Medication Sig Dispense Refill  . acetaminophen (TYLENOL) 500 MG tablet Take 1,000 mg by mouth every 6 (six) hours as needed for moderate pain or headache.    . Calcium Carbonate-Vitamin D (CALCIUM + D PO) Take 1 tablet by mouth daily.    . cetirizine (ZYRTEC) 10 MG tablet Take 10 mg by mouth at bedtime.     . ciclesonide (OMNARIS) 50 MCG/ACT nasal spray Place 2 sprays into both nostrils daily.    . Cyanocobalamin (VITAMIN B-12) 2500 MCG SUBL Place 1 mcg under the tongue daily.    Marland Kitchen. DEXILANT 60 MG capsule Take 1 capsule by mouth every morning.  2  . estradiol (ESTRACE) 0.5 MG tablet Take 1 tablet (0.5 mg total) by mouth daily. 90 tablet 3  . fluticasone (FLONASE) 50 MCG/ACT nasal spray   11  . hydrochlorothiazide (HYDRODIURIL) 25 MG tablet Take 12.5 mg by mouth daily.     . hyoscyamine (LEVBID) 0.375 MG 12 hr tablet Take 0.375 mg by mouth every 12 (twelve) hours as needed for cramping.    . montelukast (SINGULAIR) 10 MG tablet Take 10 mg by mouth daily.     .Marland Kitchen  potassium gluconate 595 MG TABS Take 595 mg by mouth daily.    . propranolol (INDERAL) 40 MG tablet Take 40 mg by mouth 2 (two) times daily.    . sodium chloride (OCEAN NASAL SPRAY) 0.65 % nasal spray Place 1 spray into the nose as needed for congestion.    . Tavaborole (KERYDIN) 5 % SOLN Apply 1 drop topically 1 day or 1 dose. Apply 1 drop to the toenail daily. 1 Bottle 3   No current facility-administered medications for this visit.    Allergies:   Codeine; Demerol; Provera; Sulfa antibiotics; and Percocet    Social History:  The patient  reports that she has never smoked. She has never used smokeless tobacco. She reports that she does not drink alcohol or use illicit drugs.   Family History:  The patient's family history includes Cancer in her father, maternal aunt, and paternal aunt; Diabetes in her maternal grandmother; Diverticulitis in her daughter and son; Heart failure in her mother; Hypertension in her mother;  Pleurisy in her father; Ulcers in her mother.    ROS:  Please see the history of present illness.   Otherwise, review of systems are positive for none.   All other systems are reviewed and negative.    PHYSICAL EXAM: VS:  BP 110/70 mmHg  Pulse 63  Ht 5' 4.5" (1.638 m)  Wt 149 lb (67.586 kg)  BMI 25.19 kg/m2  LMP 12/05/1975 , BMI Body mass index is 25.19 kg/(m^2). GEN: Well nourished, well developed, in no acute distress HEENT: normal Neck: no JVD, carotid bruits, or masses Cardiac: RRR; no murmurs, rubs, or gallops,no edema  Respiratory:  clear to auscultation bilaterally, normal work of breathing GI: soft, nontender, nondistended, + BS MS: no deformity or atrophy Skin: warm and dry, no rash Neuro:  Strength and sensation are intact Psych: euthymic mood, full affect   EKG:  EKG is ordered today. The ekg ordered today demonstrates NSR with nonspecific T wave abnormality, low voltage QRS   Recent Labs: No results found for requested labs within last 365 days.    Lipid Panel No results found for: CHOL, TRIG, HDL, CHOLHDL, VLDL, LDLCALC, LDLDIRECT    Wt Readings from Last 3 Encounters:  12/05/14 149 lb (67.586 kg)  07/24/14 146 lb (66.225 kg)  05/15/14 143 lb 12.8 oz (65.227 kg)        ASSESSMENT AND PLAN:  1.  Palpitations with normal KEG - I will get an event monitor to assess for arrhythmias. 2.  History of Mitral valve prolapse - I will check a 2D echo to assess   Current medicines are reviewed at length with the patient today.  The patient does not have concerns regarding medicines.  The following changes have been made:  no change  Labs/ tests ordered today: See above Assessment and Plan No orders of the defined types were placed in this encounter.     Disposition:   FU with me PRN pending results of her studies  SignedQuintella Reichert, MD  12/05/2014 3:09 PM    [facility] [address] [place], [place], [place]  [address] Phone: [phone]; Fax: [phone]

## 2014-12-26 ENCOUNTER — Ambulatory Visit (HOSPITAL_COMMUNITY): Payer: Medicare Other | Attending: Cardiology

## 2014-12-26 ENCOUNTER — Ambulatory Visit (INDEPENDENT_AMBULATORY_CARE_PROVIDER_SITE_OTHER): Payer: Medicare Other

## 2014-12-26 ENCOUNTER — Other Ambulatory Visit: Payer: Self-pay

## 2014-12-26 DIAGNOSIS — R002 Palpitations: Secondary | ICD-10-CM

## 2014-12-26 DIAGNOSIS — I341 Nonrheumatic mitral (valve) prolapse: Secondary | ICD-10-CM | POA: Diagnosis not present

## 2014-12-26 DIAGNOSIS — I351 Nonrheumatic aortic (valve) insufficiency: Secondary | ICD-10-CM | POA: Diagnosis not present

## 2015-04-22 ENCOUNTER — Ambulatory Visit: Payer: Medicare Other | Admitting: Podiatry

## 2015-05-03 ENCOUNTER — Encounter: Payer: Self-pay | Admitting: Podiatry

## 2015-05-03 ENCOUNTER — Ambulatory Visit (INDEPENDENT_AMBULATORY_CARE_PROVIDER_SITE_OTHER): Payer: Medicare Other

## 2015-05-03 ENCOUNTER — Ambulatory Visit (INDEPENDENT_AMBULATORY_CARE_PROVIDER_SITE_OTHER): Payer: Medicare Other | Admitting: Podiatry

## 2015-05-03 VITALS — BP 116/80 | HR 72 | Resp 18

## 2015-05-03 DIAGNOSIS — M779 Enthesopathy, unspecified: Secondary | ICD-10-CM

## 2015-05-03 DIAGNOSIS — M216X1 Other acquired deformities of right foot: Secondary | ICD-10-CM

## 2015-05-03 DIAGNOSIS — R52 Pain, unspecified: Secondary | ICD-10-CM

## 2015-05-03 NOTE — Progress Notes (Signed)
   Subjective:    Patient ID: Veronica Franklin, female    DOB: 1941/06/14, 74 y.o.   MRN: [id number]  HPI  74 year old female presents the office of concern the pain in the ball of her right foot which has been ongoing for approximately one month. She denies any history of injury or trauma to the area. She denies any numbness or tingling. She states that she has chronic swelling to her right leg over the last 9 years after lymph node was removed from her leg. She states that the pain to the ball of her right foot gets worse as she is on the day and the area does swell to the bottom of her foot intermittently. She denies any redness or warmth overlying the area. She has no other complaints at this time. She said no previous treatment.  Review of Systems  All other systems reviewed and are negative.      Objective:   Physical Exam AAO 3, NAD DP/PT pulses 2/4, CRT less than 3 seconds Protective sensation intact the Semmes Weinstein monofilament, Achilles tendon reflex intact. On the second metatarsal head plantarly with does appear to be a small amount of edema to this area with mild tenderness to palpation. There is no pain with MPJ range of motion is no gross subluxation compared with contralateral extremity. There is no pain along the dorsal aspect of the metatarsal or digit and the pain appears to be localized plantarly directly around the area of edema. There is no sensory erythema or increase in warmth. There is no crepitation. There is prominent metatarsal heads plantarly with atrophy of the fat pad. MMT 5/5, ROM WNL No open lesions or pre-ulcerative lesions No pain with calf compression, warmth, erythema.  There is chronic edema to the right lower extremity.     Assessment & Plan:  74 year old female with right second MPJ capsulitis, metatarsalgia -X-rays were obtained and reviewed with the patient.  -Treatment options discussed including all alternatives, risks, and  complications -Etiology of symptoms were discussed -Discussed thoroughly steroid injection around the area of maximal tenderness. I discussed with her risks, complications for which she understands and probably consents. Under sterile conditions a total of 1 mL of a mixture of dexamethasone phosphate and 2% lidocaine plain was infiltrated around the area of maximal tenderness into and around the second MTPJ. She tolerated injection well any complications. Post injection care was discussed with the patient. -Offloading pads were dispensed. -Ice to the area -Continue supportive shoe gear. Discussed orthotics. -Follow-up in 4 weeks or sooner if any problems arise. In the meantime, encouraged to call the office with any questions, concerns, change in symptoms.   Ovid CurdMatthew Grete Bosko, DPM

## 2015-06-03 ENCOUNTER — Ambulatory Visit (INDEPENDENT_AMBULATORY_CARE_PROVIDER_SITE_OTHER): Payer: Medicare Other | Admitting: Podiatry

## 2015-06-03 ENCOUNTER — Encounter: Payer: Self-pay | Admitting: Podiatry

## 2015-06-03 VITALS — BP 109/58 | HR 71 | Resp 18

## 2015-06-03 DIAGNOSIS — M216X1 Other acquired deformities of right foot: Secondary | ICD-10-CM | POA: Diagnosis not present

## 2015-06-03 DIAGNOSIS — M779 Enthesopathy, unspecified: Secondary | ICD-10-CM

## 2015-06-04 NOTE — Progress Notes (Signed)
Patient ID: Veronica Franklin, female   DOB: [birth year], 74 y.o.   MRN: [id number]  Subjective: 74 year old female presents as a follow-up evaluation of right second MTPJ capsulitis and metatarsalgia. She states that she has good days and bad days and today she is not having any pain. He does continue to get some occasional pain in the bottom of her foot with the ball of the foot however the offloading pads dispensed last appointment does help. She had no problems at the steroid injection. No numbness or tingling. No other complaints at this time.  Objective: AAO 3, NAD DP/PT pulses 2/4, CRT less than 3 seconds Protective sensation intact with Simms Weinstein monofilament There is decreased edema on the plantar aspect of the second metatarsal head compared to last appointment on the right foot. There is no tears palpation along metatarsal heads at this time. There is prominence of the metatarsal heads with atrophy of the fat pad particularly along the second metatarsal there appears to be plantarflexion compared to the other metatarsal heads. MPJ range of motion. No area pinpoint bony tenderness and no pain vibratory sensation. No overlying erythema or increase in warmth. No open lesions or pre-ulcerative lesions. No pain with calf compression, swelling, warmth, erythema.  Assessment: 74 year old female resolving right second MTPJ capsulitis, metatarsalgia  Plan: At today's appointment and did recommend custom orthotics to help offload the symptomatic area. She was scanned for orthotics today however we will hold these at her request top of the first year to have them made. Continue with offloading pads. Anti-inflammatories as needed if she is able to take them. We'll hold off on another steroid injection at this time. Continue supportive shoe gear. Follow-up after the first year for orthotics or sooner if any problems are to arise. Call with questions or concerns in the meantime.  Ovid CurdMatthew Manley Fason,  DPM

## 2015-07-05 ENCOUNTER — Telehealth: Payer: Self-pay | Admitting: Nurse Practitioner

## 2015-07-05 NOTE — Telephone Encounter (Signed)
Patient has a purple bump in her vulva area.

## 2015-07-05 NOTE — Telephone Encounter (Signed)
Spoke with patient. She states she noticed an area near vagina about one month ago that was 1/2 inch long and narrow on left vulva. She states the skin is discolored and is purple tinged.  She denies any pain, drainage or concerns for infection. Voiding well. She has been monitoring the area and feels it is becoming larger. Advised office visit with Dr. Hyacinth MeekerMiller to evaluate and patient is agreeable.  Scheduled office visit with Dr. Hyacinth MeekerMiller 07/09/15 at 0830 and patient is agreeable. She is advised to call back with any changes, increasing symptoms or any concerns. Routing to provider for final review. Patient agreeable to disposition. Will close encounter.

## 2015-07-09 ENCOUNTER — Ambulatory Visit (INDEPENDENT_AMBULATORY_CARE_PROVIDER_SITE_OTHER): Payer: Medicare Other | Admitting: Obstetrics & Gynecology

## 2015-07-09 ENCOUNTER — Encounter: Payer: Self-pay | Admitting: Obstetrics & Gynecology

## 2015-07-09 VITALS — BP 138/70 | HR 68 | Resp 16 | Ht 64.5 in | Wt 150.4 lb

## 2015-07-09 DIAGNOSIS — N9089 Other specified noninflammatory disorders of vulva and perineum: Secondary | ICD-10-CM

## 2015-07-09 NOTE — Progress Notes (Deleted)
GYNECOLOGY  VISIT   HPI: 75 y.o. G3P3 Divorced Caucasian female here for complaint of vaginal skin discoloration.  Pt has hx of pelvic prolapse.  Pt reports she's having increased issues of complete emptying of her bowels.  She feels this is related to the pelvic prolapse.  She reports she does a lot of extensive wiping due to not having complete emptying.  In December, she felt a raised are on her left buttocks cheek.  She looked at this with a mirror and noted it was purplish.  She feels it looks larger now and decided to come in for me to check this.    Denies vaginal bleeding.  Also denies drainage or blood from the area.    She is having increased diarrhea but she reports this is due to being on antibiotics.  She did have a BMD this morning and noted some blood with her BM today.  It was a very large BM this morning and she often has blood when this occurs.    Has appt with Shirlyn Goltz, NP, in the end of the month.    with here for  Vaginal skin discoloration.    Patient's last menstrual period was 12/05/1975.     GYNECOLOGIC HISTORY: Patient's last menstrual period was 12/05/1975. Contraception:Post menopausal Menopausal hormone therapy: Estrace Last mammogram: *** Last pap smear: ***        OB History    Gravida Para Term Preterm AB TAB SAB Ectopic Multiple Living   3 3        3          Patient Active Problem List   Diagnosis Date Noted  . Heart palpitations 12/05/2014  . Mitral valve prolapse 12/05/2014  . Rectocele 04/25/2014  . Cystocele 04/25/2014  . Cyst of buttocks 04/25/2014    Past Medical History  Diagnosis Date  . Fibromyalgia   . IBS (irritable bowel syndrome)   . Diverticulosis   . Hypertension   . Left fibular fracture 10/2013  . Patellar fracture     Left  . Patellar dislocation 10/2013    Left  . Hyperlipidemia   . GERD (gastroesophageal reflux disease)   . MVP (mitral valve prolapse)     Past Surgical History  Procedure Laterality Date  .  Bilateral salpingoophorectomy  07/10/1998    Endometrosis and LOA  . Cholecystectomy  02/2004  . Colonoscopy    . Umbilical hernia repair  07/10/1998    at same time of Ophorectomy  . Thigh lipoma Right     removed  . Breast biopsy Bilateral 3/10  . Breast biopsy Right 1959  . Breast biopsy Right 1970's & 1981  . Vaginal hysterectomy  1977    Current Outpatient Prescriptions  Medication Sig Dispense Refill  . acetaminophen (TYLENOL) 500 MG tablet Take 1,000 mg by mouth every 6 (six) hours as needed for moderate pain or headache.    . ADVAIR DISKUS 250-50 MCG/DOSE AEPB 1 PUFF TWICE A DAY INHALATION 30 DAYS  1  . Calcium Carbonate-Vitamin D (CALCIUM + D PO) Take 1 tablet by mouth daily.    . cetirizine (ZYRTEC) 10 MG tablet Take 10 mg by mouth at bedtime.     . ciclesonide (OMNARIS) 50 MCG/ACT nasal spray Place 2 sprays into both nostrils daily.    . Cyanocobalamin (VITAMIN B-12) 2500 MCG SUBL Place 1 mcg under the tongue daily.    Marland Kitchen DEXILANT 60 MG capsule Take 1 capsule by mouth every morning.  2  .  estradiol (ESTRACE) 0.5 MG tablet Take 1 tablet (0.5 mg total) by mouth daily. 90 tablet 3  . fluticasone (FLONASE) 50 MCG/ACT nasal spray   11  . hydrochlorothiazide (HYDRODIURIL) 25 MG tablet Take 12.5 mg by mouth daily.     . hyoscyamine (LEVBID) 0.375 MG 12 hr tablet Take 0.375 mg by mouth every 12 (twelve) hours as needed for cramping.    Marland Kitchen. METRONIDAZOLE, TOPICAL, 0.75 % LOTN 1 APPLICATION APPLY ON THE SKIN DAILY APPLY TO AREAS QD  11  . montelukast (SINGULAIR) 10 MG tablet Take 10 mg by mouth daily.     . potassium gluconate 595 MG TABS Take 595 mg by mouth daily.    . propranolol (INDERAL) 40 MG tablet Take 40 mg by mouth 2 (two) times daily.    . sodium chloride (OCEAN NASAL SPRAY) 0.65 % nasal spray Place 1 spray into the nose as needed for congestion.    . Tavaborole (KERYDIN) 5 % SOLN Apply 1 drop topically 1 day or 1 dose. Apply 1 drop to the toenail daily. 1 Bottle 3   No current  facility-administered medications for this visit.     ALLERGIES: Azithromycin; Codeine; Demerol; Provera; Sulfa antibiotics; and Percocet  Family History  Problem Relation Age of Onset  . Heart failure Mother   . Ulcers Mother   . Hypertension Mother   . Pleurisy Father   . Cancer Father   . Cancer Maternal Aunt     uterine cancer  . Cancer Paternal Aunt     lymphoma  . Diabetes Maternal Grandmother   . Diverticulitis Son   . Diverticulitis Daughter     Social History   Social History  . Marital Status: Divorced    Spouse Name: N/A  . Number of Children: 3  . Years of Education: N/A   Occupational History  . Not on file.   Social History Main Topics  . Smoking status: Never Smoker   . Smokeless tobacco: Never Used  . Alcohol Use: No  . Drug Use: No  . Sexual Activity: No   Other Topics Concern  . Not on file   Social History Narrative    ROS:  Pertinent items are noted in HPI.  PHYSICAL EXAMINATION:    BP 138/70 mmHg  Pulse 68  Resp 16  Ht 5' 4.5" (1.638 m)  Wt 150 lb 6.4 oz (68.221 kg)  BMI 25.43 kg/m2  LMP 12/05/1975    General appearance: alert, cooperative and appears stated age Head: Normocephalic, without obvious abnormality, atraumatic Neck: no adenopathy, supple, symmetrical, trachea midline and thyroid {EXAM; THYROID:18604} Lungs: clear to auscultation bilaterally Breasts: {Exam; breast:13139::"normal appearance, no masses or tenderness"} Heart: regular rate and rhythm Abdomen: soft, non-tender; bowel sounds normal; no masses,  no organomegaly Extremities: extremities normal, atraumatic, no cyanosis or edema Skin: Skin color, texture, turgor normal. No rashes or lesions Lymph nodes: Cervical, supraclavicular, and axillary nodes normal. No abnormal inguinal nodes palpated Neurologic: Grossly normal  Pelvic: External genitalia:  no lesions              Urethra:  normal appearing urethra with no masses, tenderness or lesions               Bartholins and Skenes: normal                 Vagina: normal appearing vagina with normal color and discharge, no lesions              Cervix: {exam;  cervix:14595}              Pap taken: {yes no:314532} Bimanual Exam:  Uterus:  {exam; uterus:12215}              Adnexa: {exam; adnexa:12223}              Rectovaginal: {yes no:314532}.  Confirms.              Anus:  normal sphincter tone, no lesions  Procedure:  Lidocaine 1%, plain, Lot # [id number].    Chaperone was present for exam.  ASSESSMENT     PLAN  Counseled regarding *** See orders {YES NO:22349} Return in ***   An After Visit Summary was printed and given to the patient.  ______ minutes face to face time of which over 50% was spent in counseling.

## 2015-07-09 NOTE — Patient Instructions (Signed)
Boudreaux's Butt Paste

## 2015-07-09 NOTE — Progress Notes (Addendum)
Subjective:     Patient ID: Veronica Franklin, female   DOB: 1940/12/31, 75 y.o.   MRN: [id number]  HPI 75 y.o. G3P3 Divorced Caucasian female here for complaint of vaginal skin discoloration.  Pt has hx of pelvic prolapse.  Pt reports she's having increased issues of complete emptying of her bowels.  She feels this is related to the pelvic prolapse.  She reports she does a lot of extensive wiping due to not having complete emptying.  In December, she felt a raised area lateral to her vaginal towards her left buttocks cheek.  She looked at this with a mirror and noted it was purplish.  She feels it looks larger now and decided to come in for me to check this.    Denies vaginal bleeding.  Also denies drainage or blood from the area.    She is having increased diarrhea but she reports this is due to being on antibiotics.  She did have a BMD this morning and noted some blood with her BM today.  It was a very large BM this morning and she often has blood when this occurs.    Has appt with Shirlyn GoltzPatty Grubb, NP, in the end of the month.    with here for  Vaginal skin discoloration.    Review of Systems  All other systems reviewed and are negative.      Objective:   Physical Exam  Constitutional: She appears well-developed and well-nourished.  Abdominal: Soft. Bowel sounds are normal. She exhibits no distension and no mass. There is no tenderness. There is no rebound and no guarding.  Genitourinary: Vagina normal.    There is no rash, tenderness, lesion or injury on the right labia. There is lesion on the left labia. There is no rash, tenderness or injury on the left labia.  Lymphadenopathy:       Right: No inguinal adenopathy present.       Left: No inguinal adenopathy present.  Skin: Skin is warm and dry.  Psychiatric: She has a normal mood and affect.   Biopsy of lesion recommended.  Consent obtained.   Area cleansed with Betadine x 3.  2cc of 1% Lidocaine instilled.  Lot:  [id number].  4mm punch  biopsy obtained and tissue excised with sterile scissors.  Silver nitrate used for excellent hemostasis.  Pt tolerated procedure well.  No dressing applied.      Assessment:     Purplish and raised vulvar lesion, does not appear to be a vascular lesion    Plan:     Biopsy results and additional recommendations will be called to pt when results are back.

## 2015-07-18 ENCOUNTER — Telehealth: Payer: Self-pay | Admitting: Emergency Medicine

## 2015-07-18 NOTE — Telephone Encounter (Signed)
-----   Message from Jerene BearsMary S Miller, MD sent at 07/16/2015  6:10 PM EST ----- Called pt personally and informed of results.  She will use vaseline on the skin nightly.  I'd like to recheck in about 4-6 weeks.  Pt has AEX scheduled with [name] 07/30/15.  I'd like to see pt for AEX and recheck at same time.  So, please cancel AEX and reschedule with me in about 4-6 weeks.  Thanks.  Pt aware she will get a phone call back about appt.

## 2015-07-18 NOTE — Telephone Encounter (Signed)
Message left to return call to Tria Noguera at [phone].    

## 2015-07-23 NOTE — Telephone Encounter (Signed)
Returned call. Patient is scheduled for annual exam and follow up with Dr. Hyacinth Meeker 08/05/15 and patient agreeable. Encounter closed.

## 2015-07-23 NOTE — Telephone Encounter (Signed)
Patient is returning a call to Tracy. °

## 2015-07-30 ENCOUNTER — Ambulatory Visit: Payer: 59 | Admitting: Nurse Practitioner

## 2015-08-05 ENCOUNTER — Encounter: Payer: Self-pay | Admitting: Obstetrics & Gynecology

## 2015-08-05 ENCOUNTER — Ambulatory Visit (INDEPENDENT_AMBULATORY_CARE_PROVIDER_SITE_OTHER): Payer: Medicare Other | Admitting: Obstetrics & Gynecology

## 2015-08-05 VITALS — BP 122/70 | HR 66 | Resp 16 | Ht 64.5 in | Wt 151.0 lb

## 2015-08-05 DIAGNOSIS — Z01419 Encounter for gynecological examination (general) (routine) without abnormal findings: Secondary | ICD-10-CM

## 2015-08-05 MED ORDER — ESTRADIOL 0.5 MG PO TABS: 0.5000 mg | ORAL_TABLET | Freq: Every day | ORAL | Status: DC

## 2015-08-05 NOTE — Progress Notes (Signed)
75 y.o. G3P3 DivorcedCaucasianF here for annual exam.  She is also here for follow up after having a vulvar biopsy.  Pt is using vaseline as directed but she is not using this every day.  Pt has used some OTC wipes for when she has looser stools.  She's not completely sure what the brand of these are so she will look at home.    No vaginal bleeding.  Going back [place] this [place] for mission trip.  Patient's last menstrual period was 12/05/1975.          Sexually active: No.  The current method of family planning is post menopausal status.    Exercising: No.  The patient does not participate in regular exercise at present. Smoker:  no  Health Maintenance: Pap:  12/13/2000 Normal  History of abnormal Pap:  no MMG: 04/19/14 BIRADS1:neg, pt aware she did not do one in the fall Colonoscopy:  09/2012 Normal  BMD: 03/28/13 Mild Osteopenia, -1.1 TDaP:  2012 Has completed  Screening Labs: PCP, Hb today: PCP, Urine today: PCP   reports that she has never smoked. She has never used smokeless tobacco. She reports that she does not drink alcohol or use illicit drugs.  Past Medical History  Diagnosis Date  . Fibromyalgia   . IBS (irritable bowel syndrome)   . Diverticulosis   . Hypertension   . Left fibular fracture 10/2013  . Patellar fracture     Left  . Patellar dislocation 10/2013    Left  . Hyperlipidemia   . GERD (gastroesophageal reflux disease)   . MVP (mitral valve prolapse)     Past Surgical History  Procedure Laterality Date  . Bilateral salpingoophorectomy  07/10/1998    Endometrosis and LOA  . Cholecystectomy  02/2004  . Colonoscopy    . Umbilical hernia repair  07/10/1998    at same time of Ophorectomy  . Thigh lipoma Right     removed  . Breast biopsy Bilateral 3/10  . Breast biopsy Right 1959  . Breast biopsy Right 1970's & 1981  . Vaginal hysterectomy  1977    Family History  Problem Relation Age of Onset  . Heart failure Mother   . Ulcers Mother   .  Hypertension Mother   . Pleurisy Father   . Cancer Father   . Cancer Maternal Aunt     uterine cancer  . Cancer Paternal Aunt     lymphoma  . Diabetes Maternal Grandmother   . Diverticulitis Son   . Diverticulitis Daughter     ROS:  Pertinent items are noted in HPI.  Otherwise, a comprehensive ROS was negative.  Exam:   BP 122/70 mmHg  Pulse 66  Resp 16  Ht 5' 4.5" (1.638 m)  Wt 151 lb (68.493 kg)  BMI 25.53 kg/m2  LMP 12/05/1975  Weight change: +5#  Height: 5' 4.5" (163.8 cm)  Ht Readings from Last 3 Encounters:  08/05/15 5' 4.5" (1.638 m)  07/09/15 5' 4.5" (1.638 m)  12/05/14 5' 4.5" (1.638 m)    General appearance: alert, cooperative and appears stated age Head: Normocephalic, without obvious abnormality, atraumatic Neck: no adenopathy, supple, symmetrical, trachea midline and thyroid normal to inspection and palpation Lungs: clear to auscultation bilaterally Breasts: normal appearance, no masses or tenderness Heart: regular rate and rhythm Abdomen: soft, non-tender; bowel sounds normal; no masses,  no organomegaly Extremities: extremities normal, atraumatic, no cyanosis or edema Skin: Skin color, texture, turgor normal. No rashes or lesions Lymph nodes: Cervical,  supraclavicular, and axillary nodes normal. No abnormal inguinal nodes palpated Neurologic: Grossly normal   Pelvic: External genitalia:  Purplish lesion inferior to right labia majora 7 x 14mm (biopsy showed lichen simplex chronicus)              Urethra:  normal appearing urethra with no masses, tenderness or lesions              Bartholins and Skenes: normal                 Vagina: normal appearing vagina with normal color and discharge, no lesions, 3rd degree cystocele with valsalva maneuver              Cervix: absent              Pap taken: No. Bimanual Exam:  Uterus:  uterus absent              Adnexa: normal adnexa and no mass, fullness, tenderness               Rectovaginal: Confirms                Anus:  normal sphincter tone, no lesions  Chaperone was present for exam.  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  Pt will call to schedule this. Pap smear taken today Mammogram is due 10/16  Pt will try to dcreased the estradiol to 1/2 of 0.5mg  of the Estradiol.  #90/4RF.  Reviewed risks with pt including DVT/PE, stroke, MI, breast cancer.  She will really try to wean off this year.    Recheck vulva 4 months Return annually or prn

## 2015-08-05 NOTE — Patient Instructions (Signed)
Last TDap was in 2012.

## 2015-09-06 ENCOUNTER — Other Ambulatory Visit: Payer: Self-pay | Admitting: Obstetrics & Gynecology

## 2015-09-06 NOTE — Telephone Encounter (Signed)
eScribe request from [facility] for refill on ESTRADIOL Last filled - 08/05/15, #90 X 3 Last AEX - 08/05/15 Next AEX - Not scheduled Last MMG - 04/19/14 BIRADS1:neg, pt aware she did not do one in the fall  RX refused.  Pt given new RX on 08/05/15 at [facility].

## 2015-11-14 ENCOUNTER — Telehealth: Payer: Self-pay | Admitting: *Deleted

## 2015-11-14 NOTE — Telephone Encounter (Signed)
Called and updated patient of BMD results. Patient verbalized understanding.

## 2015-12-03 ENCOUNTER — Ambulatory Visit (INDEPENDENT_AMBULATORY_CARE_PROVIDER_SITE_OTHER): Payer: Medicare Other | Admitting: Obstetrics & Gynecology

## 2015-12-03 ENCOUNTER — Encounter: Payer: Self-pay | Admitting: Obstetrics & Gynecology

## 2015-12-03 VITALS — BP 130/64 | HR 64 | Resp 14 | Ht 64.5 in | Wt 149.0 lb

## 2015-12-03 DIAGNOSIS — IMO0002 Reserved for concepts with insufficient information to code with codable children: Secondary | ICD-10-CM

## 2015-12-03 DIAGNOSIS — N951 Menopausal and female climacteric states: Secondary | ICD-10-CM

## 2015-12-03 DIAGNOSIS — R1011 Right upper quadrant pain: Secondary | ICD-10-CM

## 2015-12-03 DIAGNOSIS — L280 Lichen simplex chronicus: Secondary | ICD-10-CM | POA: Diagnosis not present

## 2015-12-03 DIAGNOSIS — N8110 Cystocele, unspecified: Secondary | ICD-10-CM

## 2015-12-03 DIAGNOSIS — R232 Flushing: Secondary | ICD-10-CM

## 2015-12-03 NOTE — Progress Notes (Signed)
GYNECOLOGY  VISIT   HPI: 75 y.o. G3P3 Divorced Caucasian female here for follow up after having vulvar biopsy of erythematous vulvar area as well as for several other issues/concerns that she has today.  Vulvar biopsy performed 07/09/15.  Lichen simplex chronicus was noted.  Pt denies itching or irritation.  Denies vaginal bleeding.  Pt has weaned off her HRT.  She is having hot flashes and some night sweats.  These are manageable.  She's never had hot flashes because she was on HRT for many years.  D/W pt this is common and typically does improve.  Symptoms are not severe enough for her to warrant restarting her HRT.  However, she reports that she feels like she's having a little more dryness that she notes this mostly with bowel movements.  As she has such significant prolapse, with BMs she feels more dryness at that time, likely due to changes with vaginal during BMs.  No vaginal or rectal bleeding.  She does report having an episode of right sided pain in mid march.  She's had intermittent issues with similar pain but this was the most severe it's every been.  Was seen by PCP and treated for presumptive diverticulitis with metronidazole and flagyl.  Pain did not improve so she saw Dr. Loreta Ave.  CT was negative per pt report.  Was done at a [facility] facility.  I tried to see this through Care Everywhere but could not.  Pt is on pro-biotics and pre-biotics with significant improvement.  Pain is not fully resolved, however.  Reviewed Dr. Kenna Gilbert noted in [facility] in Media tab.  Denies changes in bowel function.  No nausea or emesis.  Has several supplements she's like me to review.  One is "SLIM" with Green tea extract.  Pt has been on all of these since last summer and has not added anything this spring, specifically nothing was added around the time of the abdominal pain issue.  Lastly, desires to discuss the prolapse.  Continues to not desire surgery.  Pt can feel prolapse at times and is able to reduce it.   Reduces as well as night or when lying down.  Pt and I have tried pessaries and have discussed surgical options.  She does not feel like she is completely emptying but this is not surprising.  No UTI symptoms.    Lastly, she wants to discuss her BMD that showed show mild changes from very mild osteopenia to osteopenia.  My only recommendation at this time is to repeat this in two years due to recently stopping HRT and typical BMD changes that occur after stopping HRT.  GYNECOLOGIC HISTORY: Patient's last menstrual period was 12/05/1975. Contraception: hysterectomy  Patient Active Problem List   Diagnosis Date Noted  . Heart palpitations 12/05/2014  . Mitral valve prolapse 12/05/2014  . Rectocele 04/25/2014  . Cystocele 04/25/2014  . Cyst of buttocks 04/25/2014    Past Medical History  Diagnosis Date  . Fibromyalgia   . IBS (irritable bowel syndrome)   . Diverticulosis   . Hypertension   . Left fibular fracture 10/2013  . Patellar fracture     Left  . Patellar dislocation 10/2013    Left  . Hyperlipidemia   . GERD (gastroesophageal reflux disease)   . MVP (mitral valve prolapse)     Past Surgical History  Procedure Laterality Date  . Bilateral salpingoophorectomy  07/10/1998    Endometrosis and LOA  . Cholecystectomy  02/2004  . Colonoscopy    . Umbilical  hernia repair  07/10/1998    at same time of Ophorectomy  . Thigh lipoma Right     removed  . Breast biopsy Bilateral 3/10  . Breast biopsy Right 1959  . Breast biopsy Right 1970's & 1981  . Vaginal hysterectomy  1977    MEDS:  Reviewed in EPIC and UTD  ALLERGIES: Azithromycin; Codeine; Demerol; Provera; Sulfa antibiotics; and Percocet  Family History  Problem Relation Age of Onset  . Heart failure Mother   . Ulcers Mother   . Hypertension Mother   . Pleurisy Father   . Cancer Father   . Cancer Maternal Aunt     uterine cancer  . Cancer Paternal Aunt     lymphoma  . Diabetes Maternal Grandmother   .  Diverticulitis Son   . Diverticulitis Daughter     SH:  Non smoker, divorced  Review of Systems  Gastrointestinal: Positive for abdominal pain.  All other systems reviewed and are negative.   PHYSICAL EXAMINATION:    BP 130/64 mmHg  Pulse 64  Resp 14  Ht 5' 4.5" (1.638 m)  Wt 149 lb (67.586 kg)  BMI 25.19 kg/m2  LMP 12/05/1975    General appearance: alert, cooperative and appears stated age NAbdomen: soft, non-tender; bowel sounds normal; no masses,  no organomegaly  Pelvic: External genitalia:  Area of erythema where biopsy was performed still present on external vulvar tissue just inferior and left of introitus              Urethra:  normal appearing urethra with no masses, tenderness or lesions              Bartholins and Skenes: normal                 Vagina: normal appearing vagina with normal color and discharge, no lesions              Cervix: absent              Bimanual Exam:  Uterus:  uterus absent              Adnexa: no mass, fullness, tenderness              Anus:  normal sphincter tone, no lesions  Chaperone was present for exam.  Assessment: Lichen simplex chronicus, currently asymptomatic except for visual appearance H/O TVH, then subsequently BSO 4th degree cystocele Fluid cyst of buttocks that has resolved Right mid abdominal pain with evaluation including CT done by Dr. Loreta AveMann Cessation of HRT with mild hot flashes and possible vaginal dryness Osteopenia  Plan: Despite the many questions/concerns she raises today, I do not have a lot of new information to offer her.  It appears as though the GI evaluation was thorough without any clear source of pain identified. With no vulvar symptoms except appearance, pt does not need to use anything topically [facility] that [facility] biopsied Pt aware to repeat BMD in two years   ~25 minutes spent with patient >50% of time was in face to face discussion of above.

## 2015-12-04 DIAGNOSIS — L280 Lichen simplex chronicus: Secondary | ICD-10-CM | POA: Insufficient documentation

## 2015-12-04 DIAGNOSIS — R109 Unspecified abdominal pain: Secondary | ICD-10-CM | POA: Insufficient documentation

## 2016-01-03 ENCOUNTER — Telehealth: Payer: Self-pay | Admitting: Obstetrics & Gynecology

## 2016-01-03 NOTE — Telephone Encounter (Signed)
Patient is asking if we have her blood type on file?  Patient is also asking if Dr.Miller is finished with the CD that she dropped off after her last appointment 12/03/15?  Patient would like to pick up her CD.

## 2016-01-03 NOTE — Telephone Encounter (Signed)
Return call to patient. States she will be traveling out of the country and blood type is requested on travel forms.  [facility] Not generally done unless for pregnancy or surgery with significant risk for transfusion. Advised will call her when CD is ready for pick-up. Patient traveling 01-11-16 thru 01-25-16 so will need extra time to come pick up disc.  Routing to provider for final review. Patient agreeable to disposition. Will close encounter.    CC: Veronica Franklin, Medical records

## 2016-01-06 ENCOUNTER — Telehealth: Payer: Self-pay | Admitting: Obstetrics & Gynecology

## 2016-01-06 NOTE — Telephone Encounter (Signed)
Spoke with patient this morning and she will come by today to pick up CD.

## 2016-01-15 NOTE — Telephone Encounter (Signed)
Routing to provider for final review. Patient agreeable to disposition. Will close encounter.     

## 2016-09-16 NOTE — Progress Notes (Signed)
76 y.o. G3P3 Divorced Caucasian F here for annual exam.  Doing well.  Has chronic GI issues with loser bowel movements.  This is not new.  Has not needed to be seen for this issues.  Reports she is not adhering to vegan diet as that didn't make much difference from pain standpoint.    Has known cystocele.  Does not always feel she is emptying her bladder completely.  Has not had any UTI like symptoms.    Reports she is having some issues with a sinus cyst.  Has seen Dr. Molli Barrows about this in the past.  Recent dental x ray showed the sinus cyst was enlarged further.  Is going to see Dr. Molli Barrows regarding this.  Denies vaginal bleeding.    PCP:  Dr. Tenny Craw.  Blood work and vaccines UTD per pt report.    Patient's last menstrual period was 12/05/1975.          Sexually active: No.  The current method of family planning is post menopausal status/ hysterectomy    Exercising: No.  The patient does not participate in regular exercise at present. Smoker:  no  Health Maintenance: Pap:  2002 normal  History of abnormal Pap:  no MMG:  10/25/15 BIRADS 1 negative  Colonoscopy:  09/30/12 normal  BMD:   10/25/15 osteopenia- repeat 3-5 years  TDaP:  12/13/10  Pneumonia vaccine(s):  PCP Zostavax:   PCP Hep C testing: not indicated  Screening Labs: PCP takes care of labs   reports that she has never smoked. She has never used smokeless tobacco. She reports that she does not drink alcohol or use drugs.  Past Medical History:  Diagnosis Date  . Diverticulosis   . Fibromyalgia   . GERD (gastroesophageal reflux disease)   . Hyperlipidemia   . Hypertension   . IBS (irritable bowel syndrome)   . Left fibular fracture 10/2013  . MVP (mitral valve prolapse)   . Patellar dislocation 10/2013   Left  . Patellar fracture    Left    Past Surgical History:  Procedure Laterality Date  . BILATERAL SALPINGOOPHORECTOMY  07/10/1998   Endometrosis and LOA  . BREAST BIOPSY Bilateral 3/10  . BREAST BIOPSY Right  1959  . BREAST BIOPSY Right 1970's & 1981  . CHOLECYSTECTOMY  02/2004  . COLONOSCOPY    . thigh lipoma Right    removed  . TOOTH EXTRACTION    . UMBILICAL HERNIA REPAIR  07/10/1998   at same time of Ophorectomy  . VAGINAL HYSTERECTOMY  1977    Current Outpatient Prescriptions  Medication Sig Dispense Refill  . acetaminophen (TYLENOL) 500 MG tablet Take 1,000 mg by mouth every 6 (six) hours as needed for moderate pain or headache.    . Calcium Carbonate-Vitamin D (CALCIUM + D PO) Take 1 tablet by mouth daily.    . cetirizine (ZYRTEC) 10 MG tablet Take 10 mg by mouth at bedtime.     . Cholecalciferol (VITAMIN D3) 2000 units TABS Take by mouth daily.    . Cyanocobalamin (VITAMIN B-12) 2500 MCG SUBL Place 1 mcg under the tongue daily.    Marland Kitchen DEXILANT 60 MG capsule Take 1 capsule by mouth every morning.  2  . fluticasone (FLONASE) 50 MCG/ACT nasal spray   11  . hydrochlorothiazide (HYDRODIURIL) 25 MG tablet Take 12.5 mg by mouth daily.     . hyoscyamine (LEVBID) 0.375 MG 12 hr tablet Take 0.375 mg by mouth every 12 (twelve) hours as needed for cramping.    Marland Kitchen  Magnesium 250 MG TABS Take by mouth daily.    . Methylcellulose, Laxative, (SOLUBLE FIBER THERAPY PO) Take by mouth daily.    Marland Kitchen. METRONIDAZOLE, TOPICAL, 0.75 % LOTN Reported on 12/03/2015  11  . montelukast (SINGULAIR) 10 MG tablet Take 10 mg by mouth daily.     . potassium gluconate 595 MG TABS Take 595 mg by mouth daily.    . Probiotic Product (PRO-BIOTIC BLEND PO) Take by mouth daily.    . propranolol (INDERAL) 40 MG tablet Take 40 mg by mouth 2 (two) times daily.    . sodium chloride (OCEAN NASAL SPRAY) 0.65 % nasal spray Place 1 spray into the nose as needed for congestion.    . triamcinolone cream (KENALOG) 0.1 % 1 APPLICATION APPLY ON THE SKIN AS DIRECTED APPLY TO AFFECTED AREAS TWICE A DAY AS NEEDED.  0  . UNABLE TO FIND Take by mouth daily. Take one capsule once daily    . UNABLE TO FIND Take by mouth daily. Take one capsule once  daily    . ADVAIR DISKUS 250-50 MCG/DOSE AEPB 1 PUFF TWICE A DAY INHALATION 30 DAYS  1   No current facility-administered medications for this visit.     Family History  Problem Relation Age of Onset  . Heart failure Mother   . Ulcers Mother   . Hypertension Mother   . Pleurisy Father   . Cancer Father   . Cancer Maternal Aunt     uterine cancer  . Cancer Paternal Aunt     lymphoma  . Diabetes Maternal Grandmother   . Diverticulitis Son   . Diverticulitis Daughter     ROS:  Pertinent items are noted in HPI.  Otherwise, a comprehensive ROS was negative.  Exam:   BP 116/60 (BP Location: Right Arm, Patient Position: Sitting, Cuff Size: Normal)   Pulse 68   Resp 16   Ht 5' 4.25" (1.632 m)   Wt 158 lb (71.7 kg)   LMP 12/05/1975   BMI 26.91 kg/m   Weight change: @WEIGHTCHANGE @ Height:   Height: 5' 4.25" (163.2 cm)  Ht Readings from Last 3 Encounters:  09/18/16 5' 4.25" (1.632 m)  12/03/15 5' 4.5" (1.638 m)  08/05/15 5' 4.5" (1.638 m)    General appearance: alert, cooperative and appears stated age Head: Normocephalic, without obvious abnormality, atraumatic Neck: no adenopathy, supple, symmetrical, trachea midline and thyroid normal to inspection and palpation Lungs: clear to auscultation bilaterally Breasts: normal appearance, no masses or tenderness Heart: regular rate and rhythm Abdomen: soft, non-tender; bowel sounds normal; no masses,  no organomegaly Extremities: extremities normal, atraumatic, no cyanosis or edema Skin: Skin color, texture, turgor normal. No rashes or lesions Lymph nodes: Cervical, supraclavicular, and axillary nodes normal. No abnormal inguinal nodes palpated Neurologic: Grossly normal   Pelvic: External genitalia:  no lesions              Urethra:  normal appearing urethra with no masses, tenderness or lesions              Bartholins and Skenes: normal                 Vagina: normal appearing vagina with normal color and discharge, no  lesions              Cervix: absent              Pap taken: No. Bimanual Exam:  Uterus:  uterus absent  Adnexa: no mass, fullness, tenderness               Rectovaginal: Confirms               Anus:  normal sphincter tone, no lesions  Chaperone was present for exam.  A:  Well Woman with normal exam PMP, no HRT H/O [facility] 6/77 S/P BSO, LOA, umbilical hernia repair 4/00 due to right ovarian cyst Atrophic vaginal changes Lichen simplex chronicus, no evidence of any skin changes toda Cystocele  P:   Mammogram guidelines reviewed.  pap smear not indicated Lab work UTD D/W pt new singles vaccine.  She will discuss with Dr. Tenny Craw at next visit. return annually or prn

## 2016-09-18 ENCOUNTER — Ambulatory Visit (INDEPENDENT_AMBULATORY_CARE_PROVIDER_SITE_OTHER): Payer: Medicare Other | Admitting: Obstetrics & Gynecology

## 2016-09-18 ENCOUNTER — Encounter: Payer: Self-pay | Admitting: Obstetrics & Gynecology

## 2016-09-18 VITALS — BP 116/60 | HR 68 | Resp 16 | Ht 64.25 in | Wt 158.0 lb

## 2016-09-18 DIAGNOSIS — Z01411 Encounter for gynecological examination (general) (routine) with abnormal findings: Secondary | ICD-10-CM

## 2016-10-20 DIAGNOSIS — J302 Other seasonal allergic rhinitis: Secondary | ICD-10-CM | POA: Insufficient documentation

## 2016-10-20 DIAGNOSIS — K219 Gastro-esophageal reflux disease without esophagitis: Secondary | ICD-10-CM | POA: Insufficient documentation

## 2016-10-20 DIAGNOSIS — J341 Cyst and mucocele of nose and nasal sinus: Secondary | ICD-10-CM | POA: Insufficient documentation

## 2016-11-05 ENCOUNTER — Encounter: Payer: Self-pay | Admitting: Obstetrics & Gynecology

## 2017-06-16 ENCOUNTER — Ambulatory Visit
Admission: RE | Admit: 2017-06-16 | Discharge: 2017-06-16 | Disposition: A | Discharge status: Home / Self Care | Payer: Medicare Other | Source: Ambulatory Visit | Attending: Family Medicine | Admitting: Family Medicine

## 2017-06-16 ENCOUNTER — Other Ambulatory Visit: Payer: Self-pay | Admitting: Family Medicine

## 2017-06-16 DIAGNOSIS — M7989 Other specified soft tissue disorders: Secondary | ICD-10-CM

## 2017-07-23 ENCOUNTER — Ambulatory Visit: Payer: Medicare Other | Admitting: Podiatry

## 2017-07-23 DIAGNOSIS — L603 Nail dystrophy: Secondary | ICD-10-CM | POA: Diagnosis not present

## 2017-07-23 MED ORDER — NUVAIL EX SOLN: 1.0000 "application " | Freq: Every day | CUTANEOUS | 0 refills | Status: DC

## 2017-07-26 NOTE — Progress Notes (Signed)
Subjective: Veronica Franklin presents the office today for concerns of her left big toenail becoming thick and discolored.  She has had numerous treatments for this toenail including laser, oral medication, topical medication.  He was doing well for some time to start to come back thick and discolored.  She denies any pain to the area and she denies any swelling or redness.  Is been getting thicker over the last 4 months.  No recent treatment.  No other concerns. Denies any systemic complaints such as fevers, chills, nausea, vomiting. No acute changes since last appointment, and no other complaints at this time.   Objective: AAO x3, NAD DP/PT pulses palpable bilaterally, CRT less than 3 seconds In general all of her nails are somewhat discolored mildly hypertrophic and dystrophic but most notably the left hallux toenail is hypertrophic, dystrophic and brittle and discolored with yellow-brown discoloration.  I was able to debride the majority of the toenail today without any complications or bleeding there is no drainage or pus.  There is any redness or drainage or any signs of infection. No open lesions or pre-ulcerative lesions.  No pain with calf compression, swelling, warmth, erythema  Assessment: Onychodystrophy left hallux toenail  Plan: -All treatment options discussed with the patient including all alternatives, risks, complications.  -This point I debrided the left hallux toenail to the any complications or bleeding.  We discussed various treatment options.  Discussed further antifungal treatment but this is not been helping.  We discussed other options and we decided to start with Nuvail.  This was ordered for her today.  Discussed the success rates as well as side effects and application instructions.  Recheck in about 6 months and see how she is doing or sooner if needed.  Monitor for infection. -Patient encouraged to call the office with any questions, concerns, change in symptoms.   Vivi BarrackMatthew R  Dilon Lank DPM

## 2017-09-23 ENCOUNTER — Encounter: Payer: Self-pay | Admitting: Obstetrics and Gynecology

## 2017-09-23 ENCOUNTER — Ambulatory Visit: Payer: Medicare Other | Admitting: Obstetrics and Gynecology

## 2017-09-23 ENCOUNTER — Other Ambulatory Visit: Payer: Self-pay

## 2017-09-23 VITALS — BP 122/60 | HR 64 | Resp 14 | Wt 156.0 lb

## 2017-09-23 DIAGNOSIS — R140 Abdominal distension (gaseous): Secondary | ICD-10-CM | POA: Diagnosis not present

## 2017-09-23 DIAGNOSIS — N898 Other specified noninflammatory disorders of vagina: Secondary | ICD-10-CM

## 2017-09-23 NOTE — Progress Notes (Signed)
GYNECOLOGY  VISIT   HPI: 77 y.o.   Divorced  Caucasian  female   G3P3 with Patient's last menstrual period was 12/05/1975.   here c/o a yellow vaginal discharge. She had some oral surgery in January, was on antibiotics. They also gave her diflucan. She has noticed an increase in yellow, green vaginal d/c, not thick or watery. No itching, burning or irritation. She is leaving the country next week and wanted to make sure every thing was okay. [place] going [place] with her [employer].  Her abdomen has gotten bigger and she requests an exam. She doesn't feel any thing, just wants to be safe. She has a h/o IBS and hasn't been very good with her diet in the last year.     GYNECOLOGIC HISTORY: Patient's last menstrual period was 12/05/1975. Contraception:hysterectomy  Menopausal hormone therapy: none         OB History    Gravida  3   Para  3   Term      Preterm      AB      Living  3     SAB      TAB      Ectopic      Multiple      Live Births                 Patient Active Problem List   Diagnosis Date Noted  . 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 Abdominal pain 12/04/2015  . Heart palpitations 12/05/2014  . Mitral valve prolapse 12/05/2014  . Rectocele 04/25/2014  . Cystocele 04/25/2014    Past Medical History:  Diagnosis Date  . Diverticulosis   . Fibromyalgia   . GERD (gastroesophageal reflux disease)   . Hyperlipidemia   . Hypertension   . IBS (irritable bowel syndrome)   . Left fibular fracture 10/2013  . MVP (mitral valve prolapse)   . Patellar dislocation 10/2013   Left  . Patellar fracture    Left    Past Surgical History:  Procedure Laterality Date  . BILATERAL SALPINGOOPHORECTOMY  07/10/1998   Endometrosis and LOA  . BREAST BIOPSY Bilateral 3/10  . BREAST BIOPSY Right 1959  . BREAST BIOPSY Right 1970's & 1981  . CHOLECYSTECTOMY  02/2004  . COLONOSCOPY    . thigh lipoma Right    removed  . TOOTH EXTRACTION    . UMBILICAL HERNIA REPAIR   07/10/1998   at same time of Ophorectomy  . VAGINAL HYSTERECTOMY  1977    Current Outpatient Medications  Medication Sig Dispense Refill  . acetaminophen (TYLENOL) 500 MG tablet Take 1,000 mg by mouth every 6 (six) hours as needed for moderate pain or headache.    . Calcium Carbonate-Vitamin D (CALCIUM + D PO) Take 1 tablet by mouth daily.    . cetirizine (ZYRTEC) 10 MG tablet Take 10 mg by mouth at bedtime.     . Cholecalciferol (VITAMIN D3) 2000 units TABS Take by mouth daily.    . Cyanocobalamin (VITAMIN B-12) 2500 MCG SUBL Place 1 mcg under the tongue daily.    . Dermatological Products, Misc. (NUVAIL) SOLN Apply 1 application topically daily. 1 Bottle 0  . fluticasone (FLONASE) 50 MCG/ACT nasal spray   11  . hydrochlorothiazide (HYDRODIURIL) 25 MG tablet Take 12.5 mg by mouth daily.     . hyoscyamine (LEVBID) 0.375 MG 12 hr tablet Take 0.375 mg by mouth every 12 (twelve) hours as needed for cramping.    .Marland Kitchen  Magnesium 250 MG TABS Take by mouth daily.    . Methylcellulose, Laxative, (SOLUBLE FIBER THERAPY PO) Take by mouth daily.    Marland Kitchen METRONIDAZOLE, TOPICAL, 0.75 % LOTN Reported on 12/03/2015  11  . montelukast (SINGULAIR) 10 MG tablet Take 10 mg by mouth daily.     . potassium gluconate 595 MG TABS Take 595 mg by mouth daily.    . Probiotic Product (PRO-BIOTIC BLEND PO) Take by mouth daily.    . propranolol (INDERAL) 40 MG tablet Take 40 mg by mouth 2 (two) times daily.    . sodium chloride (OCEAN NASAL SPRAY) 0.65 % nasal spray Place 1 spray into the nose as needed for congestion.     No current facility-administered medications for this visit.      ALLERGIES: Azithromycin; Codeine; Demerol [meperidine]; Provera [medroxyprogesterone acetate]; Sulfa antibiotics; and Percocet [oxycodone-acetaminophen]  Family History  Problem Relation Age of Onset  . Heart failure Mother   . Ulcers Mother   . Hypertension Mother   . Pleurisy Father   . Cancer Father   . Cancer Maternal Aunt         uterine cancer  . Cancer Paternal Aunt        lymphoma  . Diabetes Maternal Grandmother   . Diverticulitis Son   . Diverticulitis Daughter     Social History   Socioeconomic History  . Marital status: Divorced    Spouse name: Not on file  . Number of children: 3  . Years of education: Not on file  . Highest education level: Not on file  Occupational History  . Not on file  Social Needs  . Financial resource strain: Not on file  . Food insecurity:    Worry: Not on file    Inability: Not on file  . Transportation needs:    Medical: Not on file    Non-medical: Not on file  Tobacco Use  . Smoking status: Never Smoker  . Smokeless tobacco: Never Used  Substance and Sexual Activity  . Alcohol use: No  . Drug use: No  . Sexual activity: Never    Partners: Male  Lifestyle  . Physical activity:    Days per week: Not on file    Minutes per session: Not on file  . Stress: Not on file  Relationships  . Social connections:    Talks on phone: Not on file    Gets together: Not on file    Attends religious service: Not on file    Active member of club or organization: Not on file    Attends meetings of clubs or organizations: Not on file    Relationship status: Not on file  . Intimate partner violence:    Fear of current or ex partner: Not on file    Emotionally abused: Not on file    Physically abused: Not on file    Forced sexual activity: Not on file  Other Topics Concern  . Not on file  Social History Narrative  . Not on file    Review of Systems  Constitutional: Negative.   HENT: Negative.   Eyes: Negative.   Respiratory: Negative.   Cardiovascular: Negative.   Gastrointestinal: Negative.   Genitourinary:       Vaginal discharge   Musculoskeletal: Negative.   Skin: Negative.   Neurological: Negative.   Endo/Heme/Allergies: Negative.   Psychiatric/Behavioral: Negative.     PHYSICAL EXAMINATION:    BP 122/60 (BP Location: Right Arm, Patient Position:  Sitting, Cuff  Size: Normal)   Pulse 64   Resp 14   Wt 156 lb (70.8 kg)   LMP 12/05/1975   BMI 26.57 kg/m     General appearance: alert, cooperative and appears stated age Abdomen: soft, non-tender; non distended, no masses,  no organomegaly  Pelvic: External genitalia:  no lesions              Urethra:  normal appearing urethra with no masses, tenderness or lesions              Bartholins and Skenes: normal                 Vagina: atrophic appearing vagina with normal color and discharge, no lesions. Grade 2 cystocele at rest (not symptomatic)              Cervix: absent              Bimanual Exam:  Uterus:  uterus absent              Adnexa: no mass, fullness, tenderness               Chaperone was present for exam.  ASSESSMENT C/O vaginal d/c, normal exam Increase in abdominal bloating, hasn't been eating her typical diet for her IBS. Normal exam    PLAN Affirm sent Reassured, normal abdominal/pelvic exam   An After Visit Summary was printed and given to the patient.  15 minutes face to face time of which over 50% was spent in counseling.   CC: Dr Hyacinth Meeker

## 2017-09-24 ENCOUNTER — Other Ambulatory Visit: Payer: Self-pay | Admitting: *Deleted

## 2017-09-24 LAB — VAGINITIS/VAGINOSIS, DNA PROBE
Candida Species: NEGATIVE
Gardnerella vaginalis: POSITIVE — AB
Trichomonas vaginosis: NEGATIVE

## 2017-09-24 MED ORDER — METRONIDAZOLE 500 MG PO TABS: 500.0000 mg | ORAL_TABLET | Freq: Two times a day (BID) | ORAL | 0 refills | Status: DC

## 2017-12-23 ENCOUNTER — Ambulatory Visit (INDEPENDENT_AMBULATORY_CARE_PROVIDER_SITE_OTHER): Payer: Medicare Other | Admitting: Obstetrics & Gynecology

## 2017-12-23 ENCOUNTER — Encounter: Payer: Self-pay | Admitting: Obstetrics & Gynecology

## 2017-12-23 ENCOUNTER — Other Ambulatory Visit: Payer: Self-pay

## 2017-12-23 VITALS — BP 134/64 | HR 68 | Resp 14 | Ht 64.5 in | Wt 154.0 lb

## 2017-12-23 DIAGNOSIS — N898 Other specified noninflammatory disorders of vagina: Secondary | ICD-10-CM | POA: Diagnosis not present

## 2017-12-23 DIAGNOSIS — Z01411 Encounter for gynecological examination (general) (routine) with abnormal findings: Secondary | ICD-10-CM

## 2017-12-23 NOTE — Progress Notes (Signed)
77 y.o. G3P3 DivorcedCaucasianF here for annual exam.    Pt was seen a couple of weeks ago due to swelling her lower extremities.  She did have a doppler done.    Continues to have some vaginal discharge.  Was treated with Metronidazole.    Feels bladder function is pretty good.  Emptying completely.  Does have a little issue with urinary incontinence.  Also, at times will have stool smears in her underwear.    Patient's last menstrual period was 12/05/1975.          Sexually active: No.  The current method of family planning is status post hysterectomy.    Exercising: Yes.     Smoker:  no  Health Maintenance: Pap: 2002 normal  History of abnormal Pap:  no MMG:  10/28/16 BIRADS1:neg.  Aware this is due.   Colonoscopy:  09/2012 normal BMD:   10/25/15 mild osteopenia  TDaP:  2012 Pneumonia vaccine(s):  done Shingrix:  Zostavax done  Hep C testing: n/a Screening Labs: PCP   reports that she has never smoked. She has never used smokeless tobacco. She reports that she does not drink alcohol or use drugs.  Past Medical History:  Diagnosis Date  . Diverticulosis   . Fibromyalgia   . GERD (gastroesophageal reflux disease)   . Hyperlipidemia   . Hypertension   . IBS (irritable bowel syndrome)   . Left fibular fracture 10/2013  . MVP (mitral valve prolapse)   . Patellar dislocation 10/2013   Left  . Patellar fracture    Left    Past Surgical History:  Procedure Laterality Date  . BILATERAL SALPINGOOPHORECTOMY  07/10/1998   Endometrosis and LOA  . BREAST BIOPSY Bilateral 3/10  . BREAST BIOPSY Right 1959  . BREAST BIOPSY Right 1970's & 1981  . CHOLECYSTECTOMY  02/2004  . COLONOSCOPY    . thigh lipoma Right    removed  . TOOTH EXTRACTION    . UMBILICAL HERNIA REPAIR  07/10/1998   at same time of Ophorectomy  . VAGINAL HYSTERECTOMY  1977    Current Outpatient Medications  Medication Sig Dispense Refill  . Calcium Carbonate-Vitamin D (CALCIUM + D PO) Take 1 tablet by mouth  daily.    . cetirizine (ZYRTEC) 10 MG tablet Take 10 mg by mouth at bedtime.     . Cholecalciferol (VITAMIN D3) 2000 units TABS Take by mouth daily.    . Cyanocobalamin (VITAMIN B-12) 2500 MCG SUBL Place 1 mcg under the tongue daily.    . fluticasone (FLONASE) 50 MCG/ACT nasal spray   11  . hydrochlorothiazide (HYDRODIURIL) 25 MG tablet Take 12.5 mg by mouth daily.     . hyoscyamine (LEVBID) 0.375 MG 12 hr tablet Take 1 tablet by mouth 2 (two) times daily.    . Magnesium 250 MG TABS Take by mouth daily.    . montelukast (SINGULAIR) 10 MG tablet Take 10 mg by mouth daily.     . potassium gluconate 595 MG TABS Take 595 mg by mouth daily.    . Probiotic Product (PRO-BIOTIC BLEND PO) Take by mouth daily.    . propranolol (INDERAL) 40 MG tablet Take 40 mg by mouth 2 (two) times daily.    . Pseudoephedrine HCl 15 MG/5ML LIQD Take by mouth daily as needed.    . sodium chloride (OCEAN NASAL SPRAY) 0.65 % nasal spray Place 1 spray into the nose as needed for congestion.    . triamcinolone cream (KENALOG) 0.1 % daily as needed.  1  . TURMERIC PO Take by mouth.     No current facility-administered medications for this visit.     Family History  Problem Relation Age of Onset  . Heart failure Mother   . Ulcers Mother   . Hypertension Mother   . Pleurisy Father   . Cancer Father   . Cancer Maternal Aunt        uterine cancer  . Cancer Paternal Aunt        lymphoma  . Diabetes Maternal Grandmother   . Diverticulitis Son   . Diverticulitis Daughter     Review of Systems  Gastrointestinal: Positive for constipation and diarrhea.  Genitourinary:       Night urination   All other systems reviewed and are negative.   Exam:   BP 134/64 (BP Location: Right Arm, Patient Position: Sitting, Cuff Size: Normal)   Pulse 68   Resp 14   Ht 5' 4.5" (1.638 m)   Wt 154 lb (69.9 kg)   LMP 12/05/1975   BMI 26.03 kg/m    Height: 5' 4.5" (163.8 cm)  Ht Readings from Last 3 Encounters:  12/23/17 5'  4.5" (1.638 m)  09/18/16 5' 4.25" (1.632 m)  12/03/15 5' 4.5" (1.638 m)    General appearance: alert, cooperative and appears stated age Head: Normocephalic, without obvious abnormality, atraumatic Neck: no adenopathy, supple, symmetrical, trachea midline and thyroid normal to inspection and palpation Lungs: clear to auscultation bilaterally Breasts: normal appearance, no masses or tenderness Heart: regular rate and rhythm Abdomen: soft, non-tender; bowel sounds normal; no masses,  no organomegaly Extremities: extremities normal, atraumatic, no cyanosis or edema Skin: Skin color, texture, turgor normal. No rashes or lesions Lymph nodes: Cervical, supraclavicular, and axillary nodes normal. No abnormal inguinal nodes palpated Neurologic: Grossly normal   Pelvic: External genitalia:  no lesions              Urethra:  normal appearing urethra with no masses, tenderness or lesions              Bartholins and Skenes: normal                 Vagina: normal appearing vagina with normal color and discharge, no lesions, cystocele and paravaginal defect on left, rectocele              Cervix: absent              Pap taken: No. Bimanual Exam:  Uterus:  uterus absent              Adnexa: no mass, fullness, tenderness               Rectovaginal: Confirms               Anus:  normal sphincter tone, no lesions  Chaperone was present for exam.  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  Aware this is due. pap smear not indicated Lab work it UTD Shingrix discussed.  Not sure she wants to get this. Affirm testing obtained otday return annually or prn

## 2017-12-24 ENCOUNTER — Telehealth: Payer: Self-pay | Admitting: *Deleted

## 2017-12-24 LAB — VAGINITIS/VAGINOSIS, DNA PROBE
Candida Species: NEGATIVE
Gardnerella vaginalis: POSITIVE — AB
Trichomonas vaginosis: NEGATIVE

## 2017-12-24 MED ORDER — TINIDAZOLE 500 MG PO TABS: 1.0000 g | ORAL_TABLET | Freq: Every day | ORAL | 0 refills | Status: AC

## 2017-12-24 NOTE — Telephone Encounter (Signed)
-----   Message from Jerene BearsMary S Miller, MD sent at 12/24/2017  9:46 AM EDT ----- Please let pt know this testing showed BV.  I would recommend treating with something different than the last time--tindamax 1 gram po x 5 days or flagyl 500mg  bid x 7 days.  I would recommend repeat testing in two weeks.  No ETOH with medication.

## 2017-12-24 NOTE — Telephone Encounter (Signed)
LM for pt to call back.

## 2017-12-24 NOTE — Telephone Encounter (Signed)
Pt notified. Verbalized understanding. Tindamax sent to pharmacy. Patient will call Monday to schedule f/u appt.

## 2018-01-14 ENCOUNTER — Ambulatory Visit: Payer: Medicare Other | Admitting: Obstetrics & Gynecology

## 2018-01-14 VITALS — BP 120/60 | HR 68 | Ht 64.5 in | Wt 158.0 lb

## 2018-01-14 DIAGNOSIS — N760 Acute vaginitis: Secondary | ICD-10-CM | POA: Diagnosis not present

## 2018-01-14 DIAGNOSIS — F5101 Primary insomnia: Secondary | ICD-10-CM | POA: Diagnosis not present

## 2018-01-14 NOTE — Progress Notes (Signed)
GYNECOLOGY  VISIT  CC:   Continued vaginal symptoms  HPI: 77 y.o. G3P3 Divorced Caucasian female here for follow up vaginitis.  Had + BV testing 12/23/17 as well as 09/23/17.  Pt reports she had improvement in her symptoms of vaginal irritation when she took the Tindamax but after just a few days her symptoms returned.  She is also still having vaginal discharge.  Denies urinary symptoms.  Has no pelvic pain.  D/w pt treatment for recurrent BV.  Will repeat testing today.  If positive, will proceed with treatment.  Pt reports increased issues with insomnia the last month or so.  D/w pt treatment options.  She's not sure she wants to take any prescription so she will monitor this and let me know if changes her mind.  GYNECOLOGIC HISTORY: Patient's last menstrual period was 12/05/1975. Contraception: post menopausal  Menopausal hormone therapy: none  Patient Active Problem List   Diagnosis Date Noted  . Gastroesophageal reflux disease without esophagitis 10/20/2016  . 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 Abdominal pain 12/04/2015  . Heart palpitations 12/05/2014  . Mitral valve prolapse 12/05/2014  . Rectocele 04/25/2014  . Cystocele 04/25/2014    Past Medical History:  Diagnosis Date  . Diverticulosis   . Fibromyalgia   . GERD (gastroesophageal reflux disease)   . Hyperlipidemia   . Hypertension   . IBS (irritable bowel syndrome)   . Left fibular fracture 10/2013  . MVP (mitral valve prolapse)   . Patellar dislocation 10/2013   Left  . Patellar fracture    Left    Past Surgical History:  Procedure Laterality Date  . BILATERAL SALPINGOOPHORECTOMY  07/10/1998   Endometrosis and LOA  . BREAST BIOPSY Bilateral 3/10  . BREAST BIOPSY Right 1959  . BREAST BIOPSY Right 1970's & 1981  . CHOLECYSTECTOMY  02/2004  . COLONOSCOPY    . thigh lipoma Right    removed  . TOOTH EXTRACTION    . UMBILICAL HERNIA REPAIR  07/10/1998   at same time of Ophorectomy  . VAGINAL HYSTERECTOMY  1977     MEDS:   Current Outpatient Medications on File Prior to Visit  Medication Sig Dispense Refill  . amoxicillin (AMOXIL) 500 MG capsule Take 500 mg by mouth 3 (three) times daily.    . Calcium Carbonate-Vitamin D (CALCIUM + D PO) Take 1 tablet by mouth daily.    . cetirizine (ZYRTEC) 10 MG tablet Take 10 mg by mouth at bedtime.     . Cholecalciferol (VITAMIN D3) 2000 units TABS Take by mouth daily.    . Cyanocobalamin (VITAMIN B-12) 2500 MCG SUBL Place 1 mcg under the tongue daily.    . fluticasone (FLONASE) 50 MCG/ACT nasal spray   11  . hydrochlorothiazide (HYDRODIURIL) 25 MG tablet Take 12.5 mg by mouth daily.     . hyoscyamine (LEVBID) 0.375 MG 12 hr tablet Take 1 tablet by mouth 2 (two) times daily.    . Magnesium 250 MG TABS Take by mouth daily.    . montelukast (SINGULAIR) 10 MG tablet Take 10 mg by mouth daily.     . potassium gluconate 595 MG TABS Take 595 mg by mouth daily.    . Probiotic Product (PRO-BIOTIC BLEND PO) Take by mouth daily.    . propranolol (INDERAL) 40 MG tablet Take 40 mg by mouth 2 (two) times daily.    . Pseudoephedrine HCl 15 MG/5ML LIQD Take by mouth daily as needed.    . sodium chloride (OCEAN NASAL SPRAY)  0.65 % nasal spray Place 1 spray into the nose as needed for congestion.    . triamcinolone cream (KENALOG) 0.1 % daily as needed.  1  . TURMERIC PO Take by mouth.     No current facility-administered medications on file prior to visit.     ALLERGIES: Levofloxacin; Azithromycin; Ciprofloxacin; Codeine; Demerol [meperidine]; Provera [medroxyprogesterone acetate]; Sulfa antibiotics; and Percocet [oxycodone-acetaminophen]  Family History  Problem Relation Age of Onset  . Heart failure Mother   . Ulcers Mother   . Hypertension Mother   . Pleurisy Father   . Cancer Father   . Cancer Maternal Aunt        uterine cancer  . Cancer Paternal Aunt        lymphoma  . Diabetes Maternal Grandmother   . Diverticulitis Son   . Diverticulitis Daughter      SH:  Divorced, non smoker  Review of Systems  Genitourinary:       Abnormal discharge   All other systems reviewed and are negative.   PHYSICAL EXAMINATION:    BP 120/60 (BP Location: Right Arm, Patient Position: Sitting, Cuff Size: Normal)   Pulse 68   Ht 5' 4.5" (1.638 m)   Wt 158 lb (71.7 kg)   LMP 12/05/1975   BMI 26.70 kg/m     General appearance: alert, cooperative and appears stated age Lymph:  No inguinal LAD noted  Pelvic: External genitalia:  no lesions              Urethra:  normal appearing urethra with no masses, tenderness or lesions              Bartholins and Skenes: normal                 Vagina: normal appearing vagina with normal color and discharge, 3rd degree cystocele with paravaginal defect on left              Cervix: absent              Bimanual Exam:  Uterus:  uterus absent              Adnexa: no mass, fullness, tenderness  Chaperone was present for exam.  Assessment: Vaginal irritation Cystocele with paravaginal defect H/O [facility] 6/77 New onset of insomnia  Plan: Repeat vaginitis testing performed.  Results and recommendations will be called to pt. Declines treatment for insomnia today   ~15 minutes spent with patient >50% of time was in face to face discussion of above.

## 2018-01-17 ENCOUNTER — Encounter: Payer: Self-pay | Admitting: Obstetrics & Gynecology

## 2018-01-19 LAB — NUSWAB VAGINITIS (VG)
Candida albicans, NAA: NEGATIVE
Candida glabrata, NAA: NEGATIVE
Trich vag by NAA: NEGATIVE

## 2018-01-20 ENCOUNTER — Telehealth: Payer: Self-pay | Admitting: *Deleted

## 2018-01-20 ENCOUNTER — Ambulatory Visit: Payer: Medicare Other | Admitting: Podiatry

## 2018-01-20 NOTE — Telephone Encounter (Signed)
-----   Message from Jerene BearsMary S Miller, MD sent at 01/19/2018  7:40 AM EDT ----- Please let pt know her repeat testing was negative for yeast and BV.  Some of her symptoms may be due to the prolapse.  Could consider using coconut oil topically externally and internally to see if better moisturized tissue would help.

## 2018-01-20 NOTE — Telephone Encounter (Signed)
LM for pt to call back.

## 2018-01-21 NOTE — Telephone Encounter (Signed)
Left voicemail for pt to call back.

## 2018-01-24 NOTE — Telephone Encounter (Signed)
Pt notified.  Verbalized understanding.

## 2018-02-15 ENCOUNTER — Encounter: Payer: Self-pay | Admitting: Obstetrics & Gynecology

## 2018-05-17 ENCOUNTER — Encounter: Payer: Self-pay | Admitting: Podiatry

## 2018-05-17 ENCOUNTER — Ambulatory Visit: Payer: Medicare Other | Admitting: Podiatry

## 2018-05-17 VITALS — BP 117/71 | HR 68 | Temp 97.8°F | Resp 18

## 2018-05-17 DIAGNOSIS — M79675 Pain in left toe(s): Secondary | ICD-10-CM

## 2018-05-17 DIAGNOSIS — B351 Tinea unguium: Secondary | ICD-10-CM

## 2018-05-17 DIAGNOSIS — L539 Erythematous condition, unspecified: Secondary | ICD-10-CM | POA: Diagnosis not present

## 2018-05-17 DIAGNOSIS — M79674 Pain in right toe(s): Secondary | ICD-10-CM | POA: Diagnosis not present

## 2018-05-18 NOTE — Progress Notes (Signed)
Subjective: 77 year old female presents the office today for concerns of thick, discolored toenails that should have trimmed.  Also she noticed a red spot at the tip of the left big toe.  She was to have the area checked.  Denies any recent injury or trauma or any drainage or pus coming from the toenail sites. Denies any systemic complaints such as fevers, chills, nausea, vomiting. No acute changes since last appointment, and no other complaints at this time.   Objective: AAO x3, NAD DP/PT pulses palpable bilaterally, CRT less than 3 seconds Nails are hypertrophic, dystrophic, discolored with yellow to brown discoloration and subjectively is causing discomfort and irritation inside his shoes.  To the distal aspect the lefthallux is a small erythematous area and this appears to be from the hyperkeratotic tissue as well as pressure inside of her shoes.  There is no drainage or pus there is no increase in warmth there is no edema.  This is more irritation. No open lesions or pre-ulcerative lesions.  No pain with calf compression, swelling, warmth, erythema  Assessment: Symptomatic onychomycosis, left hallux pain  Plan: -All treatment options discussed with the patient including all alternatives, risks, complications.  -Nails were debrided x10 without any complications or bleeding.  Erythema to the distal portion of the left hallux is more from irritation.  Continue offloading pads at all times.  Monitoring signs or symptoms of infection we discussed today. -Patient encouraged to call the office with any questions, concerns, change in symptoms.   Vivi BarrackMatthew R Ahniyah Giancola DPM

## 2018-08-09 DIAGNOSIS — R040 Epistaxis: Secondary | ICD-10-CM | POA: Insufficient documentation

## 2019-02-17 ENCOUNTER — Encounter: Payer: Self-pay | Admitting: Obstetrics & Gynecology

## 2019-03-03 ENCOUNTER — Ambulatory Visit: Payer: Medicare Other | Admitting: Podiatry

## 2019-03-09 ENCOUNTER — Other Ambulatory Visit: Payer: Self-pay

## 2019-03-09 ENCOUNTER — Ambulatory Visit (INDEPENDENT_AMBULATORY_CARE_PROVIDER_SITE_OTHER): Payer: Medicare Other

## 2019-03-09 ENCOUNTER — Encounter: Payer: Self-pay | Admitting: Podiatry

## 2019-03-09 ENCOUNTER — Ambulatory Visit (INDEPENDENT_AMBULATORY_CARE_PROVIDER_SITE_OTHER): Payer: Medicare Other | Admitting: Podiatry

## 2019-03-09 DIAGNOSIS — M79674 Pain in right toe(s): Secondary | ICD-10-CM | POA: Diagnosis not present

## 2019-03-09 DIAGNOSIS — M659 Synovitis and tenosynovitis, unspecified: Secondary | ICD-10-CM

## 2019-03-09 DIAGNOSIS — B351 Tinea unguium: Secondary | ICD-10-CM | POA: Diagnosis not present

## 2019-03-09 DIAGNOSIS — M792 Neuralgia and neuritis, unspecified: Secondary | ICD-10-CM | POA: Diagnosis not present

## 2019-03-09 DIAGNOSIS — M79675 Pain in left toe(s): Secondary | ICD-10-CM

## 2019-03-09 DIAGNOSIS — M25571 Pain in right ankle and joints of right foot: Secondary | ICD-10-CM

## 2019-03-09 MED ORDER — MELOXICAM 15 MG PO TABS: 15.0000 mg | ORAL_TABLET | Freq: Every day | ORAL | 0 refills | Status: DC

## 2019-03-15 NOTE — Progress Notes (Signed)
Subjective: 78 y.o. returns the office today for painful, elongated, thickened toenails which she cannot trim herself. Denies any redness or drainage around the nails.  States that while she is here she wants me to look at her right ankle.  She had a dull aching pain in her ankle that goes up to her hip which is been about 1 month.  She denies any significant pain to the ankle itself.  Her main concern is the radiating pain.  No significant injury.  No increase in swelling.  Denies any acute changes since last appointment and no new complaints today. Denies any systemic complaints such as fevers, chills, nausea, vomiting.   PCP: Lawerance Cruel, MD  Objective: AAO 3, NAD DP/PT pulses palpable, CRT less than 3 seconds Nails hypertrophic, dystrophic, elongated, brittle, discolored 10. There is tenderness overlying the nails 1-5 bilaterally. There is no surrounding erythema or drainage along the nail sites. There is no specific area pinpoint tenderness there is no edema, erythema.  Subjectively she is describing some pain the lateral aspect going up the lateral aspect of her leg to her hip. No pain with calf compression, swelling, warmth, erythema.  Assessment: Patient presents with symptomatic onychomycosis; sciatica/nerve related pain right side  Plan: -Treatment options including alternatives, risks, complications were discussed -Nails sharply debrided 10 without complication/bleeding. -X-rays obtained reviewed.  Is a sclerotic line distal aspect of the fibula.  This is likely from old injury.  I was concerned with stress fracture however on exam there is no significant tenderness palpation to the distal malleoli.  There is no edema in this area as well with no pain vibratory sensation.  I think more the pain is nerve related.  I prescribed meloxicam.  If no improvement, may now follow-up with her primary care physician. -Discussed daily foot inspection. If there are any changes, to call the  office immediately.  -Follow-up in 3 months or sooner if any problems are to arise. In the meantime, encouraged to call the office with any questions, concerns, changes symptoms.  Celesta Gentile, DPM

## 2019-03-31 NOTE — Progress Notes (Signed)
78 y.o. G3P3 Divorced White or Caucasian female here for annual exam.  Has just moved into a multigenerational home with son and her partner.  Partner is a Marine scientist at [employer].    Denies vaginal bleeding.    Ex-husband passed in September due to AAA with surgical complications.  Patient is asking if it is ok to take 500mg  of magnesium instead of the 250mg  she accidentally bought the 500mg .   Did see Dr. Collene Mares in the last month.  Reports her stools were green looking.    Patient's last menstrual period was 12/05/1975.          Sexually active: No.  The current method of family planning is abstinence.    Exercising:  The patient does not participate in regular exercise at present. Smoker:  no  Health Maintenance: Pap:  2002 normal  History of abnormal Pap:  no MMG:  02/17/19 density C Bi-rads 1 neg Colonoscopy:   09/2012 normal BMD:    10/25/15 mild osteopenia TDaP:  2012 Pneumonia vaccine(s):  yes Shingrix:   Zostavax done Hep C testing: NA Screening Labs: PCP   reports that she has never smoked. She has never used smokeless tobacco. She reports that she does not drink alcohol or use drugs.  Past Medical History:  Diagnosis Date  . Diverticulosis   . Fibromyalgia   . GERD (gastroesophageal reflux disease)   . Hyperlipidemia   . Hypertension   . IBS (irritable bowel syndrome)   . Left fibular fracture 10/2013  . MVP (mitral valve prolapse)   . Patellar dislocation 10/2013   Left  . Patellar fracture    Left    Past Surgical History:  Procedure Laterality Date  . BILATERAL SALPINGOOPHORECTOMY  07/10/1998   Endometrosis and LOA  . BREAST BIOPSY Bilateral 3/10  . BREAST BIOPSY Right 1959  . BREAST BIOPSY Right 1970's & 1981  . CHOLECYSTECTOMY  02/2004  . COLONOSCOPY    . thigh lipoma Right    removed  . TOOTH EXTRACTION    . UMBILICAL HERNIA REPAIR  07/10/1998   at same time of Ophorectomy  . VAGINAL HYSTERECTOMY  1977    Current Outpatient Medications  Medication Sig  Dispense Refill  . Calcium Carbonate-Vitamin D (CALCIUM + D PO) Take 1 tablet by mouth daily.    . cetirizine (ZYRTEC) 10 MG tablet Take 10 mg by mouth at bedtime.     . Cholecalciferol (VITAMIN D3) 2000 units TABS Take by mouth daily.    . Cyanocobalamin (VITAMIN B-12) 2500 MCG SUBL Place 1 mcg under the tongue daily.    . fluticasone (FLONASE) 50 MCG/ACT nasal spray   11  . hydrochlorothiazide (HYDRODIURIL) 25 MG tablet Take 12.5 mg by mouth daily.     . hyoscyamine (LEVBID) 0.375 MG 12 hr tablet Take 1 tablet by mouth 2 (two) times daily.    . Magnesium 250 MG TABS Take by mouth daily.    . meloxicam (MOBIC) 15 MG tablet Take 1 tablet (15 mg total) by mouth daily. 14 tablet 0  . montelukast (SINGULAIR) 10 MG tablet Take 10 mg by mouth daily.     . potassium gluconate 595 MG TABS Take 595 mg by mouth daily.    . Probiotic Product (PRO-BIOTIC BLEND PO) Take by mouth daily.    . propranolol (INDERAL) 40 MG tablet Take 40 mg by mouth 2 (two) times daily.    . Pseudoephedrine HCl 15 MG/5ML LIQD Take by mouth daily as needed.    Marland Kitchen  sodium chloride (OCEAN NASAL SPRAY) 0.65 % nasal spray Place 1 spray into the nose as needed for congestion.    . triamcinolone cream (KENALOG) 0.1 % daily as needed.  1  . TURMERIC PO Take by mouth.     No current facility-administered medications for this visit.     Family History  Problem Relation Age of Onset  . Heart failure Mother   . Ulcers Mother   . Hypertension Mother   . Pleurisy Father   . Cancer Father   . Cancer Maternal Aunt        uterine cancer  . Cancer Paternal Aunt        lymphoma  . Diabetes Maternal Grandmother   . Diverticulitis Son   . Diverticulitis Daughter     Review of Systems  All other systems reviewed and are negative.   Exam:   BP (!) 150/84   Pulse 66   Temp 98.4 F (36.9 C)   Ht 5\' 4"  (1.626 m)   Wt 154 lb 12.8 oz (70.2 kg)   LMP 12/05/1975   SpO2 97%   BMI 26.57 kg/m     Height: 5\' 4"  (162.6 cm)  Ht  Readings from Last 3 Encounters:  04/06/19 5\' 4"  (1.626 m)  01/14/18 5' 4.5" (1.638 m)  12/23/17 5' 4.5" (1.638 m)    General appearance: alert, cooperative and appears stated age Head: Normocephalic, without obvious abnormality, atraumatic Neck: no adenopathy, supple, symmetrical, trachea midline and thyroid normal to inspection and palpation Lungs: clear to auscultation bilaterally Breasts: normal appearance, no masses or tenderness Heart: regular rate and rhythm Abdomen: soft, non-tender; bowel sounds normal; no masses,  no organomegaly Extremities: extremities normal, atraumatic, no cyanosis or edema Skin: Skin color, texture, turgor normal. No rashes or lesions Lymph nodes: Cervical, supraclavicular, and axillary nodes normal. No abnormal inguinal nodes palpated Neurologic: Grossly normal   Pelvic: External genitalia:  no lesions              Urethra:  normal appearing urethra with no masses, tenderness or lesions              Bartholins and Skenes: normal                 Vagina: normal appearing vagina with normal color and discharge, no lesions, 3rd degree cystocele              Cervix: absent              Pap taken: No. Bimanual Exam:  Uterus:  uterus absent              Adnexa: no mass, fullness, tenderness               Rectovaginal: Confirms               Anus:  normal sphincter tone, no lesions  Chaperone was present for exam.  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  Doing yearly. pap smear not indicated Lab work done with 1978 with Dr. 2001, in February Reviewed vaccinations with pt.  Aware should have high dose flu shot. Colonoscopy 2014 return annually or prn

## 2019-04-04 ENCOUNTER — Other Ambulatory Visit: Payer: Self-pay

## 2019-04-06 ENCOUNTER — Other Ambulatory Visit: Payer: Self-pay

## 2019-04-06 ENCOUNTER — Encounter: Payer: Self-pay | Admitting: Obstetrics & Gynecology

## 2019-04-06 ENCOUNTER — Ambulatory Visit (INDEPENDENT_AMBULATORY_CARE_PROVIDER_SITE_OTHER): Payer: Medicare Other | Admitting: Obstetrics & Gynecology

## 2019-04-06 VITALS — BP 150/84 | HR 66 | Temp 98.4°F | Ht 64.0 in | Wt 154.8 lb

## 2019-04-06 DIAGNOSIS — Z01419 Encounter for gynecological examination (general) (routine) without abnormal findings: Secondary | ICD-10-CM

## 2019-04-13 ENCOUNTER — Telehealth: Payer: Self-pay | Admitting: Podiatry

## 2019-04-13 MED ORDER — MELOXICAM 15 MG PO TABS: 15.0000 mg | ORAL_TABLET | Freq: Every day | ORAL | 0 refills | Status: DC

## 2019-04-13 NOTE — Telephone Encounter (Signed)
Pt called needs refill on meloxicam

## 2019-04-13 NOTE — Addendum Note (Signed)
Addended by: Harriett Sine D on: 04/13/2019 01:15 PM   Modules accepted: Orders

## 2019-04-13 NOTE — Telephone Encounter (Signed)
I informed pt that Dr. Jacqualyn Posey refilled but needed to be seen prior to future refills. Pt states she is scheduled to come in in December.

## 2019-04-27 ENCOUNTER — Other Ambulatory Visit: Payer: Self-pay | Admitting: Podiatry

## 2019-05-08 ENCOUNTER — Telehealth: Payer: Self-pay | Admitting: *Deleted

## 2019-05-08 ENCOUNTER — Telehealth: Payer: Self-pay | Admitting: Obstetrics & Gynecology

## 2019-05-08 MED ORDER — MELOXICAM 15 MG PO TABS: 15.0000 mg | ORAL_TABLET | Freq: Every day | ORAL | 0 refills | Status: DC

## 2019-05-08 NOTE — Telephone Encounter (Signed)
[facility] states she is calling concerning refill for a mutual pt.

## 2019-05-08 NOTE — Telephone Encounter (Signed)
Reviewed 04/06/19 AEX notes, vaccine discussed.   Spoke with patient. Nurse visit scheduled for 11/4 at 2:15pm for high dose flu vaccine. Patient declines earlier appt offered.   Routing to provider for final review. Patient is agreeable to disposition. Will close encounter.

## 2019-05-08 NOTE — Telephone Encounter (Signed)
Patient is calling regarding flu vaccine. Patient stated that the day she was in the office (04/06/2019), we were out of the 65+ flu vaccines. Patient would like to come back into the office and receive the vaccine.

## 2019-05-08 NOTE — Telephone Encounter (Signed)
I called [facility] and she states pt would like a refill of the Meloxicam she had made an appt to be seen. Pt is scheduled for 06/08/2019. Dr. Jacqualyn Posey refilled for 30 meloxicam.

## 2019-05-10 ENCOUNTER — Ambulatory Visit (INDEPENDENT_AMBULATORY_CARE_PROVIDER_SITE_OTHER): Payer: Medicare Other | Admitting: *Deleted

## 2019-05-10 ENCOUNTER — Other Ambulatory Visit: Payer: Self-pay

## 2019-05-10 DIAGNOSIS — Z23 Encounter for immunization: Secondary | ICD-10-CM

## 2019-05-10 NOTE — Progress Notes (Signed)
Patient here for high dose flu injection. Consent obtained. Injection given in right arm. Patient tolerated well. Left office in stable condition.

## 2019-05-15 ENCOUNTER — Other Ambulatory Visit: Payer: Self-pay | Admitting: Podiatry

## 2019-06-08 ENCOUNTER — Ambulatory Visit: Payer: Medicare Other | Admitting: Podiatry

## 2019-06-08 ENCOUNTER — Other Ambulatory Visit: Payer: Self-pay

## 2019-06-08 DIAGNOSIS — B351 Tinea unguium: Secondary | ICD-10-CM

## 2019-06-08 DIAGNOSIS — M79675 Pain in left toe(s): Secondary | ICD-10-CM | POA: Diagnosis not present

## 2019-06-08 DIAGNOSIS — M79674 Pain in right toe(s): Secondary | ICD-10-CM

## 2019-06-13 NOTE — Progress Notes (Signed)
Subjective: 78 y.o. returns the office today for painful, elongated, thickened toenails which she cannot trim herself. Denies any redness or drainage around the nails.  She also has discussed possible sciatica treatment.  She says occasionally she is concerned pain from her hip going down her leg.  She has not had any recent treatment she denies any recent injury.  She does have a history of surgery in her right thigh. Denies any acute changes since last appointment and no new complaints today. Denies any systemic complaints such as fevers, chills, nausea, vomiting.   PCP: Lawerance Cruel, MD  Objective: AAO 3, NAD DP/PT pulses palpable, CRT less than 3 seconds Nails hypertrophic, dystrophic, elongated, brittle, discolored 10. There is tenderness overlying the nails 1-5 bilaterally. There is no surrounding erythema or drainage along the nail sites. There is no specific area pinpoint tenderness there is no edema, erythema.  Subjectively she is describing some pain the lateral aspect going up the lateral aspect of her leg to her hip. No pain with calf compression, swelling, warmth, erythema.  Assessment: Patient presents with symptomatic onychomycosis; sciatica/nerve related pain right side  Plan: -Treatment options including alternatives, risks, complications were discussed -Nails sharply debrided 10 without complication/bleeding. -Recommend her to follow-up with her primary care physician regards and sciatica on the right side and 2 other issues causing the right thigh pain given her history of surgery in the area. -Discussed daily foot inspection. If there are any changes, to call the office immediately.  -Follow-up in 3 months or sooner if any problems are to arise. In the meantime, encouraged to call the office with any questions, concerns, changes symptoms.  Celesta Gentile, DPM

## 2019-07-13 ENCOUNTER — Encounter: Payer: Self-pay | Admitting: Obstetrics & Gynecology

## 2019-09-01 ENCOUNTER — Encounter: Payer: Self-pay | Admitting: Obstetrics & Gynecology

## 2019-09-07 ENCOUNTER — Ambulatory Visit: Payer: Medicare Other | Admitting: Podiatry

## 2019-09-07 ENCOUNTER — Other Ambulatory Visit: Payer: Self-pay

## 2019-09-07 DIAGNOSIS — M79674 Pain in right toe(s): Secondary | ICD-10-CM

## 2019-09-07 DIAGNOSIS — B351 Tinea unguium: Secondary | ICD-10-CM

## 2019-09-07 DIAGNOSIS — M79675 Pain in left toe(s): Secondary | ICD-10-CM

## 2019-09-18 NOTE — Progress Notes (Signed)
Subjective: 79 y.o. returns the office today for painful, elongated, thickened toenails which she cannot trim herself. Denies any redness or drainage around the nails.  Denies any acute changes since last appointment and no new complaints today. Denies any systemic complaints such as fevers, chills, nausea, vomiting.   PCP: [name], MD  Objective: AAO 3, NAD DP/PT pulses palpable, CRT less than 3 seconds Nails hypertrophic, dystrophic, elongated, brittle, discolored 10. There is tenderness overlying the nails 1-5 bilaterally. There is no surrounding erythema or drainage along the nail sites. No pain with calf compression, swelling, warmth, erythema.  Assessment: Patient presents with symptomatic onychomycosis; sciatica/nerve related pain right side  Plan: -Treatment options including alternatives, risks, complications were discussed -Nails sharply debrided 10 without complication/bleeding. -Sciatica/nerve pain has improved. She is going to follow up with her PCP as well.  -Discussed daily foot inspection. If there are any changes, to call the office immediately.  -Follow-up in 3 months or sooner if any problems are to arise. In the meantime, encouraged to call the office with any questions, concerns, changes symptoms.  Ovid Curd, DPM

## 2019-09-25 ENCOUNTER — Encounter: Payer: Self-pay | Admitting: Obstetrics & Gynecology

## 2019-09-25 DIAGNOSIS — I341 Nonrheumatic mitral (valve) prolapse: Secondary | ICD-10-CM | POA: Diagnosis not present

## 2019-09-25 DIAGNOSIS — M8580 Other specified disorders of bone density and structure, unspecified site: Secondary | ICD-10-CM | POA: Diagnosis not present

## 2019-09-25 DIAGNOSIS — I10 Essential (primary) hypertension: Secondary | ICD-10-CM | POA: Diagnosis not present

## 2019-09-25 DIAGNOSIS — Z0000 Encounter for general adult medical examination without abnormal findings: Secondary | ICD-10-CM | POA: Diagnosis not present

## 2019-09-25 DIAGNOSIS — M5430 Sciatica, unspecified side: Secondary | ICD-10-CM | POA: Diagnosis not present

## 2019-09-25 DIAGNOSIS — K219 Gastro-esophageal reflux disease without esophagitis: Secondary | ICD-10-CM | POA: Diagnosis not present

## 2019-09-25 DIAGNOSIS — R5383 Other fatigue: Secondary | ICD-10-CM | POA: Diagnosis not present

## 2019-09-25 DIAGNOSIS — J309 Allergic rhinitis, unspecified: Secondary | ICD-10-CM | POA: Diagnosis not present

## 2019-09-25 DIAGNOSIS — E781 Pure hyperglyceridemia: Secondary | ICD-10-CM | POA: Diagnosis not present

## 2019-09-27 ENCOUNTER — Telehealth: Payer: Self-pay | Admitting: Obstetrics & Gynecology

## 2019-09-27 NOTE — Telephone Encounter (Signed)
Spoke with patient, advised per Dr. Miller. Patient verbalizes understanding and is agreeable. Encounter closed.  

## 2019-09-27 NOTE — Telephone Encounter (Signed)
Spoke to pt. Pt states having a pimple in pubic area x 2 days ago,  then popped and started draining light blood today as seen on underwear. Pt denies pain or hx of HSV. Pt advised to be seen for OV with Dr Hyacinth Meeker. Pt denies OV at this time and will monitor "bump". Will return call to office if wants OV at a later time. Pt states having some health issues at this time and cant make appt right now and had physical at PCP yesterday.   Routing to Dr Hyacinth Meeker for review and please advise for any additional recommendations.

## 2019-09-27 NOTE — Telephone Encounter (Signed)
Patient is calling regarding a spot that she thought was a pimple, but it has now popped and is draining blood. Patient stated that she's not sure if she needs to come in or not, but wanted to discuss with a nurse.

## 2019-09-27 NOTE — Telephone Encounter (Signed)
Can use hot compresses three times daily and wash with antibacterial soap as well.  If draining, usually this is what will really help it heal.  She should watch to make sure it doesn't have redness around it that is extending and getting larger.  Thanks.

## 2019-09-29 DIAGNOSIS — R8271 Bacteriuria: Secondary | ICD-10-CM | POA: Diagnosis not present

## 2019-09-29 DIAGNOSIS — R351 Nocturia: Secondary | ICD-10-CM | POA: Diagnosis not present

## 2019-09-29 DIAGNOSIS — N390 Urinary tract infection, site not specified: Secondary | ICD-10-CM | POA: Diagnosis not present

## 2019-10-09 ENCOUNTER — Telehealth: Payer: Self-pay | Admitting: Obstetrics & Gynecology

## 2019-10-09 NOTE — Telephone Encounter (Signed)
Patient is calling regarding what she believed to be a pimple in her pubic area. Patient stated that she thought it was getting better, but it started bleeding again on Friday.

## 2019-10-09 NOTE — Telephone Encounter (Signed)
Left message to call Trayton Szabo at [phone]. 

## 2019-10-09 NOTE — Telephone Encounter (Signed)
Patient returned call

## 2019-10-10 ENCOUNTER — Ambulatory Visit: Payer: Medicare PPO | Admitting: Obstetrics & Gynecology

## 2019-10-10 ENCOUNTER — Encounter: Payer: Self-pay | Admitting: Obstetrics & Gynecology

## 2019-10-10 ENCOUNTER — Other Ambulatory Visit: Payer: Self-pay

## 2019-10-10 VITALS — BP 110/68 | HR 68 | Temp 97.3°F | Resp 16 | Wt 160.0 lb

## 2019-10-10 DIAGNOSIS — M546 Pain in thoracic spine: Secondary | ICD-10-CM

## 2019-10-10 DIAGNOSIS — N898 Other specified noninflammatory disorders of vagina: Secondary | ICD-10-CM | POA: Diagnosis not present

## 2019-10-10 DIAGNOSIS — N9089 Other specified noninflammatory disorders of vulva and perineum: Secondary | ICD-10-CM

## 2019-10-10 NOTE — Progress Notes (Signed)
GYNECOLOGY  VISIT  CC:   Vulvar lesion/bump  HPI: 79 y.o. G3P3 Divorced White or Caucasian female here for assessment of vulvar lesion that has been present for several weeks.  She was using warm compresses on the lesion but on Friday, it started to become more tender and she had a little blood and drainage from the area.  It's not very tender.  She's not having much drainage since that time.    Separately, she reports having darker urine noted in late January.  Saw Dr. Zachery Dauer and a urine culture was obtained.  The culture was negative so she was not treated.  She started having increased issues with emptying her bladder and ended up going back for follow up.  She had a second negative urine culture.  Was recommended to see Dr. Sherron Monday.  She was treated with antibiotics.  She reports urine looks much better.  She does have follow up scheduled 10/20/2019.  During all of the time when this was occurring, she noted the vulvar bump.  She was using warm compresses.  Is having some minor vaginal discharge that she would like tested today.  No odor.  Denies vaginal bleeding.  She moved into a new home in the fall.  It is an intergenerational home with pt on the first floor and son and partner on the second floor.  In December, she saw her podiatrist to have her toe nails done.  She described to him some ankle/leg pain/backpain.  It was suggested that she be seen for this.  She hasn't done this yet.  She wonders if this is related to a new mattress that got when she moved.  Pt does have fibromyalgia so she is not sure if this is related as well.  The biggest issue is a lot of mid back pain that has decreased her mobility.  States she has longevity in her family and until this year really felt like she could like to 100.  Due to back pain, leg pain she doesn't feel that way any longer.  Would like evaluation but doesn't really know where to start.  Doesn't want surgery if at all possible (if there was something  wrong with her back).  Just would like to feel better.   GYNECOLOGIC HISTORY: Patient's last menstrual period was 12/05/1975. Contraception: postmenopausal Menopausal hormone therapy: none  Patient Active Problem List   Diagnosis Date Noted  . Gastroesophageal reflux disease without esophagitis 10/20/2016  . 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 Abdominal pain 12/04/2015  . Heart palpitations 12/05/2014  . Mitral valve prolapse 12/05/2014  . Rectocele 04/25/2014  . Cystocele 04/25/2014    Past Medical History:  Diagnosis Date  . Diverticulosis   . Fibromyalgia   . GERD (gastroesophageal reflux disease)   . Hyperlipidemia   . Hypertension   . IBS (irritable bowel syndrome)   . Left fibular fracture 10/2013  . MVP (mitral valve prolapse)   . Patellar dislocation 10/2013   Left  . Patellar fracture    Left    Past Surgical History:  Procedure Laterality Date  . BILATERAL SALPINGOOPHORECTOMY  07/10/1998   Endometrosis and LOA  . BREAST BIOPSY Bilateral 3/10  . BREAST BIOPSY Right 1959  . BREAST BIOPSY Right 1970's & 1981  . CHOLECYSTECTOMY  02/2004  . COLONOSCOPY    . thigh lipoma Right    removed  . TOOTH EXTRACTION    . UMBILICAL HERNIA REPAIR  07/10/1998   at same time of Ophorectomy  .  VAGINAL HYSTERECTOMY  1977    MEDS:   Current Outpatient Medications on File Prior to Visit  Medication Sig Dispense Refill  . Calcium Carbonate-Vitamin D (CALCIUM + D PO) Take 1 tablet by mouth daily.    . cetirizine (ZYRTEC) 10 MG tablet Take 10 mg by mouth at bedtime.     . Cholecalciferol (VITAMIN D3) 2000 units TABS Take by mouth daily.    . Cyanocobalamin (VITAMIN B-12) 2500 MCG SUBL Place 1 mcg under the tongue daily.    . fluticasone (FLONASE) 50 MCG/ACT nasal spray   11  . hydrochlorothiazide (HYDRODIURIL) 25 MG tablet Take 12.5 mg by mouth daily.     . hyoscyamine (LEVBID) 0.375 MG 12 hr tablet Take 1 tablet by mouth 2 (two) times daily.    . Magnesium 250 MG TABS Take  by mouth daily.    . meloxicam (MOBIC) 15 MG tablet TAKE 1 TABLET (15 MG TOTAL) BY MOUTH DAILY. 14 tablet 0  . montelukast (SINGULAIR) 10 MG tablet Take 10 mg by mouth daily.     . potassium gluconate 595 MG TABS Take 595 mg by mouth daily.    . Probiotic Product (PRO-BIOTIC BLEND PO) Take by mouth daily.    . propranolol (INDERAL) 40 MG tablet Take 40 mg by mouth 2 (two) times daily.    . Pseudoephedrine HCl 15 MG/5ML LIQD Take by mouth daily as needed.    . sodium chloride (OCEAN NASAL SPRAY) 0.65 % nasal spray Place 1 spray into the nose as needed for congestion.    . triamcinolone cream (KENALOG) 0.1 % daily as needed.  1  . TURMERIC PO Take by mouth.     No current facility-administered medications on file prior to visit.    ALLERGIES: Levofloxacin, Azithromycin, Ciprofloxacin, Codeine, Demerol [meperidine], Provera [medroxyprogesterone acetate], Sulfa antibiotics, and Percocet [oxycodone-acetaminophen]  Family History  Problem Relation Age of Onset  . Heart failure Mother   . Ulcers Mother   . Hypertension Mother   . Pleurisy Father   . Cancer Father   . Cancer Maternal Aunt        uterine cancer  . Cancer Paternal Aunt        lymphoma  . Diabetes Maternal Grandmother   . Diverticulitis Son   . Diverticulitis Daughter     SH:  Divorced, non smoker  Review of Systems  Constitutional: Negative.   HENT: Negative.   Eyes: Negative.   Respiratory: Negative.   Cardiovascular: Negative.   Gastrointestinal: Negative.   Endocrine: Negative.   Genitourinary:       Vaginal irritation, discharge, vaginal bump  Musculoskeletal: Negative.   Skin: Negative.   Allergic/Immunologic: Negative.   Neurological: Negative.   Psychiatric/Behavioral: Negative.     PHYSICAL EXAMINATION:   Vitals:   10/10/19 1542  BP: 110/68  Pulse: 68  Resp: 16  Temp: (!) 97.3 F (36.3 C)    General appearance: alert, cooperative and appears stated age Abdomen: soft, non-tender; bowel  sounds normal; no masses,  no organomegaly Lymph:  no inguinal LAD noted  Pelvic: External genitalia: healing lesion on inferior mons with eschar in place.  As I could not tell if there was still infection present, this eschar was teased off and no blood or purulent drainage was noted.  Appears to be healing well.              Urethra:  normal appearing urethra with no masses, tenderness or lesions  Bartholins and Skenes: normal      Vagina:  No visible discharge or lesions noted, cystocele and rectocele present  Cervix: surgically absent  Uterus: surgically absent  BME:  No masses noted  Chaperone, Zenovia Jordan, CMA, was present for exam.  Assessment: Vulvar abscess that seems to have drained and is healing Vaginal discharge Back pain with leg pain that is really limiting her mobility  Plan: Pt will continue to monitor vulvar lesion.  Advised to expect this to take a few more weeks to fully heal and resolve with normal skin coloration.  Affirm pending.  Results and recommendations will be called to pt.  D/w pt options for ortho evaluation of back.  Will refer to Dr. Katrinka Blazing, sports medicine.  This pt is typically very active and healthy so this is definitely a change for her and I think and evaluation would be beneficial.   35 minutes total time spent with pt.

## 2019-10-10 NOTE — Patient Instructions (Signed)
Antoine Primas, DO [facility] [address] [place], [place] [address] [phone]

## 2019-10-10 NOTE — Telephone Encounter (Signed)
Spoke with patient. Patient states that since she called in on 09/27/2019 the bump in her pubic area has grown in size. Reports it is a little smaller than a dime. On 10/06/2019 noticed it draining blood tinged fluid yet area is getting larger. Reports the site is dark red in color. Denies any pain or warmth to the area. Denies any fever or chills. Patient has been using hot compresses as recommended by Dr.Miller since 09/27/2019. Advised will need OV for further evaluation. Patient is agreeable. Appointment scheduled for today at 3:45 pm with Dr.Miller. Patient is agreeable to date and time.  Routing to provider and will close encounter.

## 2019-10-11 LAB — VAGINITIS/VAGINOSIS, DNA PROBE
Candida Species: NEGATIVE
Gardnerella vaginalis: NEGATIVE
Trichomonas vaginosis: NEGATIVE

## 2019-10-18 DIAGNOSIS — R3914 Feeling of incomplete bladder emptying: Secondary | ICD-10-CM | POA: Diagnosis not present

## 2019-10-18 DIAGNOSIS — D49512 Neoplasm of unspecified behavior of left kidney: Secondary | ICD-10-CM | POA: Diagnosis not present

## 2019-10-18 DIAGNOSIS — N281 Cyst of kidney, acquired: Secondary | ICD-10-CM | POA: Diagnosis not present

## 2019-10-31 ENCOUNTER — Ambulatory Visit (INDEPENDENT_AMBULATORY_CARE_PROVIDER_SITE_OTHER): Payer: Medicare PPO

## 2019-10-31 ENCOUNTER — Other Ambulatory Visit: Payer: Self-pay

## 2019-10-31 ENCOUNTER — Encounter: Payer: Self-pay | Admitting: Family Medicine

## 2019-10-31 ENCOUNTER — Ambulatory Visit (INDEPENDENT_AMBULATORY_CARE_PROVIDER_SITE_OTHER): Payer: Medicare PPO | Admitting: Family Medicine

## 2019-10-31 VITALS — BP 116/70 | HR 77 | Ht 64.5 in | Wt 158.0 lb

## 2019-10-31 DIAGNOSIS — M549 Dorsalgia, unspecified: Secondary | ICD-10-CM | POA: Diagnosis not present

## 2019-10-31 DIAGNOSIS — M545 Low back pain, unspecified: Secondary | ICD-10-CM

## 2019-10-31 DIAGNOSIS — G8929 Other chronic pain: Secondary | ICD-10-CM | POA: Diagnosis not present

## 2019-10-31 NOTE — Progress Notes (Signed)
[name] [address] [place] [address] Phone: [phone] Subjective:   I Veronica Franklin am serving as a Neurosurgeon for Dr. Antoine Primas.  This visit occurred during the SARS-CoV-2 public health emergency.  Safety protocols were in place, including screening questions prior to the visit, additional usage of staff PPE, and extensive cleaning of exam room while observing appropriate contact time as indicated for disinfecting solutions.   I'm seeing this patient by the request  of:  [name], MD  CC: Back pain and multiple joint pain  [id number]  Veronica Franklin is a 79 y.o. female coming in with complaint of back pain. Patient has fibromyalgia since the 80s. Patient has a history of knee problems. Patient has arthritis. Right ankle pain that radiates up the leg. Patient wakes up in the middle of the night with pain in her right leg. Surgery in 2004 lipoma on the right side. Patient remembers that after than surgery the right leg hasn't bee the same. Has been wearing compression socks that has helped.   Onset- Chronic  Location - thoracic and lower back pain  Duration-  Character- throbbing that gets worse throughout the day Aggravating factors- walking, standing  Reliving factors-patient has had difficulty finding relieving symptoms and seems to be sporadic Therapies tried-therapy, medications, icing Severity-sometimes 7 out of 10     Past Medical History:  Diagnosis Date  . Diverticulosis   . Fibromyalgia   . GERD (gastroesophageal reflux disease)   . Hyperlipidemia   . Hypertension   . IBS (irritable bowel syndrome)   . Left fibular fracture 10/2013  . MVP (mitral valve prolapse)   . Patellar dislocation 10/2013   Left  . Patellar fracture    Left   Past Surgical History:  Procedure Laterality Date  . BILATERAL SALPINGOOPHORECTOMY  07/10/1998   Endometrosis and LOA  . BREAST BIOPSY Bilateral 3/10  . BREAST BIOPSY Right  1959  . BREAST BIOPSY Right 1970's & 1981  . CHOLECYSTECTOMY  02/2004  . COLONOSCOPY    . thigh lipoma Right    removed  . TOOTH EXTRACTION    . UMBILICAL HERNIA REPAIR  07/10/1998   at same time of Ophorectomy  . VAGINAL HYSTERECTOMY  1977   Social History   Socioeconomic History  . Marital status: Divorced    Spouse name: Not on file  . Number of children: 3  . Years of education: Not on file  . Highest education level: Not on file  Occupational History  . Not on file  Tobacco Use  . Smoking status: Never Smoker  . Smokeless tobacco: Never Used  Substance and Sexual Activity  . Alcohol use: No  . Drug use: No  . Sexual activity: Not Currently    Partners: Male  Other Topics Concern  . Not on file  Social History Narrative  . Not on file   Social Determinants of Health   Financial Resource Strain:   . Difficulty of Paying Living Expenses:   Food Insecurity:   . Worried About Programme researcher, broadcasting/film/video in the Last Year:   . Barista in the Last Year:   Transportation Needs:   . Freight forwarder (Medical):   Marland Kitchen Lack of Transportation (Non-Medical):   Physical Activity:   . Days of Exercise per Week:   . Minutes of Exercise per Session:   Stress:   . Feeling of Stress :   Social Connections:   .  Frequency of Communication with Friends and Family:   . Frequency of Social Gatherings with Friends and Family:   . Attends Religious Services:   . Active Member of Clubs or Organizations:   . Attends Archivist Meetings:   Marland Kitchen Marital Status:    Allergies  Allergen Reactions  . Levofloxacin     Muscle pain  Tendonitis   . Azithromycin Diarrhea    Extreme diarrhea  . Ciprofloxacin   . Codeine Nausea And Vomiting  . Demerol [Meperidine] Nausea And Vomiting  . Provera [Medroxyprogesterone Acetate]     unknown  . Sulfa Antibiotics Nausea And Vomiting  . Percocet [Oxycodone-Acetaminophen] Nausea And Vomiting   Family History  Problem Relation Age of  Onset  . Heart failure Mother   . Ulcers Mother   . Hypertension Mother   . Pleurisy Father   . Cancer Father   . Cancer Maternal Aunt        uterine cancer  . Cancer Paternal Aunt        lymphoma  . Diabetes Maternal Grandmother   . Diverticulitis Son   . Diverticulitis Daughter      Current Outpatient Medications (Cardiovascular):  .  hydrochlorothiazide (HYDRODIURIL) 12.5 MG tablet,  .  propranolol (INDERAL) 40 MG tablet, Take 40 mg by mouth 2 (two) times daily.  Current Outpatient Medications (Respiratory):  Marland Kitchen  Chlorpheniramine Maleate (ALLERGY PO), Take by mouth. .  fluticasone (FLONASE) 50 MCG/ACT nasal spray,  .  montelukast (SINGULAIR) 10 MG tablet, Take 10 mg by mouth daily.  .  Pseudoephedrine HCl 15 MG/5ML LIQD, Take by mouth daily as needed. .  sodium chloride (OCEAN NASAL SPRAY) 0.65 % nasal spray, Place 1 spray into the nose as needed for congestion.  Current Outpatient Medications (Analgesics):  .  meloxicam (MOBIC) 15 MG tablet, TAKE 1 TABLET (15 MG TOTAL) BY MOUTH DAILY.  Current Outpatient Medications (Hematological):  Marland Kitchen  Cyanocobalamin (B-12 PO), Take 1,000 mcg by mouth.  Current Outpatient Medications (Other):  Marland Kitchen  Cholecalciferol (VITAMIN D3) 2000 units TABS, Take by mouth daily. .  hyoscyamine (LEVBID) 0.375 MG 12 hr tablet, Take 1 tablet by mouth 2 (two) times daily. .  Magnesium 250 MG TABS, Take by mouth daily. Marland Kitchen  POTASSIUM GLUCONATE PO, Take 99 mg by mouth. .  Probiotic Product (PRO-BIOTIC BLEND PO), Take by mouth daily. Marland Kitchen  triamcinolone cream (KENALOG) 0.1 %, daily as needed. .  TURMERIC PO, Take by mouth.   Reviewed prior external information including notes and imaging from  primary care provider As well as notes that were available from care everywhere and other healthcare systems.  Past medical history, social, surgical and family history all reviewed in electronic medical record.  No pertanent information unless stated regarding to the  chief complaint.   Review of Systems:  No headache, visual changes, nausea, vomiting, diarrhea, constipation, dizziness, abdominal pain, skin rash, fevers, chills, night sweats, weight loss, swollen lymph nodes, , joint swelling, chest pain, shortness of breath, mood changes. POSITIVE muscle aches, body aches  Objective  Blood pressure 116/70, pulse 77, height 5' 4.5" (1.638 m), weight 158 lb (71.7 kg), last menstrual period 12/05/1975, SpO2 97 %.   General: No apparent distress alert and oriented x3 mood and affect normal, dressed appropriately.  HEENT: Pupils equal, extraocular movements intact  Respiratory: Patient's speak in full sentences and does not appear short of breath  Cardiovascular: No lower extremity edema, non tender, no erythema  Neuro: Cranial nerves II through XII  are intact, neurovascularly intact in all extremities with 2+ DTRs and 2+ pulses.  Gait mild antalgic MSK: Significant arthritic changes of multiple joints.  Back exam shows increasing kyphosis of the upper back.  Patient does have loss of lordosis of the lumbar spine with degenerative scoliosis noted.  Tightness noted with straight leg test and FABER test.  Patient is neurovascularly intact distally.  [id number]; 15 additional minutes spent for Therapeutic exercises as stated in above notes.  This included exercises focusing on stretching, strengthening, with significant focus on eccentric aspects.   Long term goals include an improvement in range of motion, strength, endurance as well as avoiding reinjury. Patient's frequency would include in 1-2 times a day, 3-5 times a week for a duration of 6-12 weeks. Low back exercises that included:  Pelvic tilt/bracing instruction to focus on control of the pelvic girdle and lower abdominal muscles  Glute strengthening exercises, focusing on proper firing of the glutes without engaging the low back muscles Proper stretching techniques for maximum relief for the hamstrings, hip  flexors, low back and some rotation where tolerated   Proper technique shown and discussed handout in great detail with ATC.  All questions were discussed and answered.      Impression and Recommendations:     This case required medical decision making of moderate complexity. The above documentation has been reviewed and is accurate and complete Judi Saa, DO       Note: This dictation was prepared with Dragon dictation along with smaller phrase technology. Any transcriptional errors that result from this process are unintentional.

## 2019-10-31 NOTE — Assessment & Plan Note (Signed)
Patient does have back pain that is diffuse.  Likely osteoarthritic changes and x-rays ordered today.  Reviewed patient's chart and the entirety and did have an MRI of the lumbar spine previously done that was independently visualized by me showing the patient did have some spinal stenosis as well as some degenerative disc disease.  Patient has taken meloxicam previously and encouraged to use an oral burst.  Discussed home exercises and patient work with Event organiser.  Discussed vitamin supplementation medical be beneficial.  Discussed the potential of weight loss.  Icing regimen.  Follow-up again in 4 to 8 weeks.  Worsening pain will consider the possibility of

## 2019-10-31 NOTE — Patient Instructions (Addendum)
Good to see you.  Ice 20 minutes 2 times daily. Usually after activity and before bed. Exercises 3 times a week.   Xray of the back today Tart cherry extract 1200mg  at night Vitamin D 4000-5000 IU daily  CQ10 200 daily for next 4 weeks Continue other vitamins Recumbent bike is better See me again in 5 weeks

## 2019-11-01 DIAGNOSIS — N281 Cyst of kidney, acquired: Secondary | ICD-10-CM | POA: Diagnosis not present

## 2019-11-01 DIAGNOSIS — D3002 Benign neoplasm of left kidney: Secondary | ICD-10-CM | POA: Diagnosis not present

## 2019-11-01 DIAGNOSIS — D49512 Neoplasm of unspecified behavior of left kidney: Secondary | ICD-10-CM | POA: Diagnosis not present

## 2019-11-10 DIAGNOSIS — I10 Essential (primary) hypertension: Secondary | ICD-10-CM | POA: Diagnosis not present

## 2019-11-10 DIAGNOSIS — E781 Pure hyperglyceridemia: Secondary | ICD-10-CM | POA: Diagnosis not present

## 2019-11-15 ENCOUNTER — Encounter: Payer: Self-pay | Admitting: Family Medicine

## 2019-11-17 ENCOUNTER — Encounter: Payer: Self-pay | Admitting: Family Medicine

## 2019-11-22 DIAGNOSIS — Z85828 Personal history of other malignant neoplasm of skin: Secondary | ICD-10-CM | POA: Diagnosis not present

## 2019-11-22 DIAGNOSIS — D1801 Hemangioma of skin and subcutaneous tissue: Secondary | ICD-10-CM | POA: Diagnosis not present

## 2019-11-22 DIAGNOSIS — D225 Melanocytic nevi of trunk: Secondary | ICD-10-CM | POA: Diagnosis not present

## 2019-11-22 DIAGNOSIS — L821 Other seborrheic keratosis: Secondary | ICD-10-CM | POA: Diagnosis not present

## 2019-11-22 DIAGNOSIS — L570 Actinic keratosis: Secondary | ICD-10-CM | POA: Diagnosis not present

## 2019-11-29 DIAGNOSIS — N281 Cyst of kidney, acquired: Secondary | ICD-10-CM | POA: Diagnosis not present

## 2019-11-29 DIAGNOSIS — R351 Nocturia: Secondary | ICD-10-CM | POA: Diagnosis not present

## 2019-11-29 DIAGNOSIS — R3915 Urgency of urination: Secondary | ICD-10-CM | POA: Diagnosis not present

## 2019-11-29 DIAGNOSIS — R3914 Feeling of incomplete bladder emptying: Secondary | ICD-10-CM | POA: Diagnosis not present

## 2019-12-05 ENCOUNTER — Encounter: Payer: Self-pay | Admitting: Family Medicine

## 2019-12-06 ENCOUNTER — Encounter: Payer: Self-pay | Admitting: Family Medicine

## 2019-12-06 ENCOUNTER — Ambulatory Visit: Payer: Medicare PPO | Admitting: Family Medicine

## 2019-12-06 ENCOUNTER — Other Ambulatory Visit: Payer: Self-pay

## 2019-12-06 DIAGNOSIS — G8929 Other chronic pain: Secondary | ICD-10-CM | POA: Diagnosis not present

## 2019-12-06 DIAGNOSIS — M549 Dorsalgia, unspecified: Secondary | ICD-10-CM | POA: Diagnosis not present

## 2019-12-06 MED ORDER — MELOXICAM 7.5 MG PO TABS
7.5000 mg | ORAL_TABLET | Freq: Every day | ORAL | 0 refills | Status: DC
Start: 2019-12-06 — End: 2020-01-04

## 2019-12-06 NOTE — Assessment & Plan Note (Signed)
Back pain is multifactorial.  Has been some improvement and seems to respond very well to anti-inflammatories.  Patient is concerned of taking it regularly.  We discussed with patient about core strengthening and stability.  Patient will continue with the meloxicam at a lower dose discussed the potential for physical therapy as well.  Follow-up with me again in 6 to 8 weeks total time reviewing patient's chart, talking about my chart messages and talking with patient 33 minutes

## 2019-12-06 NOTE — Patient Instructions (Signed)
Meloxicam 7.5 3x a week no matter what Continue exercises and stay active See me in 6 week, if not better will do MRI

## 2019-12-06 NOTE — Progress Notes (Signed)
Veronica Franklin [address] [place] [address] Phone: [phone] Subjective:    Veronica Franklin, am serving as a scribe for Dr. Antoine Primas. This visit occurred during the SARS-CoV-2 public health emergency.  Safety protocols were in place, including screening questions prior to the visit, additional usage of staff PPE, and extensive cleaning of exam room while observing appropriate contact time as indicated for disinfecting solutions.  eing this patient by the request  of:  Veronica Floro, MD  CC: Low back pain follow-up  [id number]   10/31/2019 Patient does have back pain that is diffuse.  Likely osteoarthritic changes and x-rays ordered today.  Reviewed patient's chart and the entirety and did have an MRI of the lumbar spine previously done that was independently visualized by me showing the patient did have some spinal stenosis as well as some degenerative disc disease.  Patient has taken meloxicam previously and encouraged to use an oral burst.  Discussed home exercises and patient work with Event organiser.  Discussed vitamin supplementation medical be beneficial.  Discussed the potential of weight loss.  Icing regimen.  Follow-up again in 4 to 8 weeks.  Worsening pain will consider the possibility of  Update 12/06/2019 Veronica Franklin is a 79 y.o. female coming in with complaint of back pain. Patient states that her pain is improving.  States that by day 3 of taking meloxicam things, pain was better but day 4 she did not take meloxicam and her pain returned. Is now using meloxicam every other day as well for past 2 weeks. Pain has overall been better with this plan. Has been having achiness in right knee which has improved with meloxicam.   Patient did have x-rays at last exam that were independently visualized by me.  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     Past Medical  History:  Diagnosis Date  . Diverticulosis   . Fibromyalgia   . GERD (gastroesophageal reflux disease)   . Hyperlipidemia   . Hypertension   . IBS (irritable bowel syndrome)   . Left fibular fracture 10/2013  . MVP (mitral valve prolapse)   . Patellar dislocation 10/2013   Left  . Patellar fracture    Left   Past Surgical History:  Procedure Laterality Date  . BILATERAL SALPINGOOPHORECTOMY  07/10/1998   Endometrosis and LOA  . BREAST BIOPSY Bilateral 3/10  . BREAST BIOPSY Right 1959  . BREAST BIOPSY Right 1970's & 1981  . CHOLECYSTECTOMY  02/2004  . COLONOSCOPY    . thigh lipoma Right    removed  . TOOTH EXTRACTION    . UMBILICAL HERNIA REPAIR  07/10/1998   at same time of Ophorectomy  . VAGINAL HYSTERECTOMY  1977   Social History   Socioeconomic History  . Marital status: Divorced    Spouse name: Not on file  . Number of children: 3  . Years of education: Not on file  . Highest education level: Not on file  Occupational History  . Not on file  Tobacco Use  . Smoking status: Never Smoker  . Smokeless tobacco: Never Used  Substance and Sexual Activity  . Alcohol use: No  . Drug use: No  . Sexual activity: Not Currently    Partners: Male  Other Topics Concern  . Not on file  Social History Narrative  . Not on file   Social Determinants of Health   Financial Resource  Strain:   . Difficulty of Paying Living Expenses:   Food Insecurity:   . Worried About Charity fundraiser in the Last Year:   . Arboriculturist in the Last Year:   Transportation Needs:   . Film/video editor (Medical):   Marland Kitchen Lack of Transportation (Non-Medical):   Physical Activity:   . Days of Exercise per Week:   . Minutes of Exercise per Session:   Stress:   . Feeling of Stress :   Social Connections:   . Frequency of Communication with Friends and Family:   . Frequency of Social Gatherings with Friends and Family:   . Attends Religious Services:   . Active Member of Clubs or  Organizations:   . Attends Archivist Meetings:   Marland Kitchen Marital Status:    Allergies  Allergen Reactions  . Levofloxacin     Muscle pain  Tendonitis   . Azithromycin Diarrhea    Extreme diarrhea  . Ciprofloxacin   . Codeine Nausea And Vomiting  . Demerol [Meperidine] Nausea And Vomiting  . Provera [Medroxyprogesterone Acetate]     unknown  . Sulfa Antibiotics Nausea And Vomiting  . Percocet [Oxycodone-Acetaminophen] Nausea And Vomiting   Family History  Problem Relation Age of Onset  . Heart failure Mother   . Ulcers Mother   . Hypertension Mother   . Pleurisy Father   . Cancer Father   . Cancer Maternal Aunt        uterine cancer  . Cancer Paternal Aunt        lymphoma  . Diabetes Maternal Grandmother   . Diverticulitis Son   . Diverticulitis Daughter      Current Outpatient Medications (Cardiovascular):  .  hydrochlorothiazide (HYDRODIURIL) 12.5 MG tablet,  .  propranolol (INDERAL) 40 MG tablet, Take 40 mg by mouth 2 (two) times daily.  Current Outpatient Medications (Respiratory):  Marland Kitchen  Chlorpheniramine Maleate (ALLERGY PO), Take by mouth. .  fluticasone (FLONASE) 50 MCG/ACT nasal spray,  .  montelukast (SINGULAIR) 10 MG tablet, Take 10 mg by mouth daily.  .  Pseudoephedrine HCl 15 MG/5ML LIQD, Take by mouth daily as needed. .  sodium chloride (OCEAN NASAL SPRAY) 0.65 % nasal spray, Place 1 spray into the nose as needed for congestion.  Current Outpatient Medications (Analgesics):  .  meloxicam (MOBIC) 15 MG tablet, TAKE 1 TABLET (15 MG TOTAL) BY MOUTH DAILY. .  meloxicam (MOBIC) 7.5 MG tablet, Take 1 tablet (7.5 mg total) by mouth daily.  Current Outpatient Medications (Hematological):  Marland Kitchen  Cyanocobalamin (B-12 PO), Take 1,000 mcg by mouth.  Current Outpatient Medications (Other):  Marland Kitchen  Cholecalciferol (VITAMIN D3) 2000 units TABS, Take by mouth daily. .  hyoscyamine (LEVBID) 0.375 MG 12 hr tablet, Take 1 tablet by mouth 2 (two) times daily. .  Magnesium  250 MG TABS, Take by mouth daily. Marland Kitchen  POTASSIUM GLUCONATE PO, Take 99 mg by mouth. .  Probiotic Product (PRO-BIOTIC BLEND PO), Take by mouth daily. Marland Kitchen  triamcinolone cream (KENALOG) 0.1 %, daily as needed. .  TURMERIC PO, Take by mouth.   Reviewed prior external information including notes and imaging from  primary care provider As well as notes that were available from care everywhere and other healthcare systems.  Past medical history, social, surgical and family history all reviewed in electronic medical record.  No pertanent information unless stated regarding to the chief complaint.   Review of Systems:  No headache, visual changes, nausea, vomiting, diarrhea, constipation,  dizziness, abdominal pain, skin rash, fevers, chills, night sweats, weight loss, swollen lymph nodes, body aches, joint swelling, chest pain, shortness of breath, mood changes. POSITIVE muscle aches  Objective  Blood pressure 110/72, pulse 85, height 5' 4.5" (1.638 m), weight 158 lb (71.7 kg), last menstrual period 12/05/1975, SpO2 96 %.   General: No apparent distress alert and oriented x3 mood and affect normal, dressed appropriately.  HEENT: Pupils equal, extraocular movements intact  Respiratory: Patient's speak in full sentences and does not appear short of breath  Cardiovascular: No lower extremity edema, non tender, no erythema  Neuro: Cranial nerves II through XII are intact, neurovascularly intact in all extremities with 2+ DTRs and 2+ pulses.  Gait very mild antalgic Back exam does show some degenerative scoliosis.  Tightness with FABER test.  Worsening pain with extension greater than 10 degrees.  Neurovascularly intact distally.  5-5 strength in lower extremities   Impression and Recommendations:     The above documentation has been reviewed and is accurate and complete Judi Saa, DO       Note: This dictation was prepared with Dragon dictation along with smaller phrase technology. Any  transcriptional errors that result from this process are unintentional.

## 2019-12-11 ENCOUNTER — Other Ambulatory Visit: Payer: Self-pay

## 2019-12-11 ENCOUNTER — Ambulatory Visit: Payer: Medicare PPO | Admitting: Podiatry

## 2019-12-11 ENCOUNTER — Encounter: Payer: Self-pay | Admitting: Podiatry

## 2019-12-11 DIAGNOSIS — M79675 Pain in left toe(s): Secondary | ICD-10-CM | POA: Diagnosis not present

## 2019-12-11 DIAGNOSIS — M79674 Pain in right toe(s): Secondary | ICD-10-CM

## 2019-12-11 DIAGNOSIS — B351 Tinea unguium: Secondary | ICD-10-CM | POA: Diagnosis not present

## 2019-12-11 NOTE — Progress Notes (Signed)
Subjective: 79 y.o. returns the office today for painful, elongated, thickened toenails which she cannot trim herself. Denies any redness or drainage around the nails.  Denies any acute changes since last appointment and no new complaints today.  She has follow-up with sports medicine for the right leg pain.  Denies any systemic complaints such as fevers, chills, nausea, vomiting.   PCP: [name], MD  Objective: AAO 3, NAD DP/PT pulses palpable, CRT less than 3 seconds Nails hypertrophic, dystrophic, elongated, brittle, discolored 10. There is tenderness overlying the nails 1-5 bilaterally. There is no surrounding erythema or drainage along the nail sites. No pain with calf compression, swelling, warmth, erythema.  Assessment: Patient presents with symptomatic onychomycosis; sciatica/nerve related pain right side  Plan: -Treatment options including alternatives, risks, complications were discussed -Nails sharply debrided 10 without complication/bleeding. -Discussed daily foot inspection. If there are any changes, to call the office immediately.  -Follow-up in 3 months or sooner if any problems are to arise. In the meantime, encouraged to call the office with any questions, concerns, changes symptoms.  Ovid Curd, DPM

## 2019-12-26 DIAGNOSIS — H16223 Keratoconjunctivitis sicca, not specified as Sjogren's, bilateral: Secondary | ICD-10-CM | POA: Diagnosis not present

## 2020-01-04 ENCOUNTER — Other Ambulatory Visit: Payer: Self-pay | Admitting: Family Medicine

## 2020-01-17 ENCOUNTER — Ambulatory Visit: Payer: Medicare PPO | Admitting: Family Medicine

## 2020-02-13 ENCOUNTER — Ambulatory Visit: Payer: Medicare PPO | Admitting: Family Medicine

## 2020-02-13 DIAGNOSIS — I10 Essential (primary) hypertension: Secondary | ICD-10-CM | POA: Diagnosis not present

## 2020-02-13 DIAGNOSIS — E781 Pure hyperglyceridemia: Secondary | ICD-10-CM | POA: Diagnosis not present

## 2020-02-19 ENCOUNTER — Encounter: Payer: Self-pay | Admitting: Podiatry

## 2020-02-19 ENCOUNTER — Other Ambulatory Visit: Payer: Self-pay

## 2020-02-19 ENCOUNTER — Ambulatory Visit: Payer: Medicare PPO | Admitting: Podiatry

## 2020-02-19 DIAGNOSIS — M79674 Pain in right toe(s): Secondary | ICD-10-CM | POA: Diagnosis not present

## 2020-02-19 DIAGNOSIS — R252 Cramp and spasm: Secondary | ICD-10-CM

## 2020-02-19 DIAGNOSIS — M79675 Pain in left toe(s): Secondary | ICD-10-CM | POA: Diagnosis not present

## 2020-02-19 DIAGNOSIS — B351 Tinea unguium: Secondary | ICD-10-CM

## 2020-02-19 DIAGNOSIS — M722 Plantar fascial fibromatosis: Secondary | ICD-10-CM | POA: Diagnosis not present

## 2020-02-19 DIAGNOSIS — Q828 Other specified congenital malformations of skin: Secondary | ICD-10-CM

## 2020-02-19 MED ORDER — UREA 40 % EX CREA: 1.0000 "application " | TOPICAL_CREAM | Freq: Every day | CUTANEOUS | 0 refills | Status: DC

## 2020-02-19 NOTE — Patient Instructions (Signed)

## 2020-02-23 DIAGNOSIS — Z1231 Encounter for screening mammogram for malignant neoplasm of breast: Secondary | ICD-10-CM | POA: Diagnosis not present

## 2020-02-26 NOTE — Progress Notes (Signed)
Subjective: 79 y.o. returns the office today for painful, elongated, thickened toenails which she cannot trim herself. Denies any redness or drainage around the nails.  She does state that she has some cramps in the arch at times mostly at nighttime.  No recent injuries or falls or any swelling.  Also has a callus on the right foot.  No ulceration she reports.  She has follow-up with sports medicine for the right leg pain.  Denies any systemic complaints such as fevers, chills, nausea, vomiting.   PCP: [name], MD  Objective: AAO 3, NAD DP/PT pulses palpable, CRT less than 3 seconds Nails hypertrophic, dystrophic, elongated, brittle, discolored 10. There is tenderness overlying the nails 1-5 bilaterally. There is no surrounding erythema or drainage along the nail sites. Hyperkeratotic lesion right foot submetatarsal 5.  There is no underlying ulceration drainage or any signs of infection. At this time there is no sign of tenderness palpation bilateral feet otherwise.  Subjectively along the arch of the foot on the course of the plantar fascia is where she gets tightness and sometimes discomfort.  She also history of plantar fasciitis as well.  Negative Tinel sign. No pain with calf compression, swelling, warmth, erythema.  Assessment: Patient presents with symptomatic onychomycosis; hyperkeratotic lesion; plantar fasciitis  Plan: -Treatment options including alternatives, risks, complications were discussed -Nails sharply debrided 10 without complication/bleeding.  Urea cream for toenails -Debrided hyperkeratotic lesion x1 without any complications or bleeding -Discussed stretching, icing exercises daily discussed shoe modifications.  She is already on meloxicam for her leg pain. -Discussed daily foot inspection. If there are any changes, to call the office immediately.  -Follow-up in 3 months or sooner if any problems are to arise. In the meantime, encouraged to call the office  with any questions, concerns, changes symptoms.  Ovid Curd, DPM

## 2020-03-05 ENCOUNTER — Other Ambulatory Visit: Payer: Self-pay | Admitting: Family Medicine

## 2020-03-12 ENCOUNTER — Encounter: Payer: Self-pay | Admitting: Family Medicine

## 2020-03-12 ENCOUNTER — Ambulatory Visit: Payer: Medicare PPO | Admitting: Family Medicine

## 2020-03-12 ENCOUNTER — Ambulatory Visit (INDEPENDENT_AMBULATORY_CARE_PROVIDER_SITE_OTHER): Payer: Medicare PPO

## 2020-03-12 ENCOUNTER — Other Ambulatory Visit: Payer: Self-pay

## 2020-03-12 VITALS — BP 110/84 | HR 67 | Ht 64.5 in | Wt 159.0 lb

## 2020-03-12 DIAGNOSIS — M25561 Pain in right knee: Secondary | ICD-10-CM

## 2020-03-12 DIAGNOSIS — G8929 Other chronic pain: Secondary | ICD-10-CM | POA: Diagnosis not present

## 2020-03-12 DIAGNOSIS — M170 Bilateral primary osteoarthritis of knee: Secondary | ICD-10-CM | POA: Diagnosis not present

## 2020-03-12 DIAGNOSIS — M1711 Unilateral primary osteoarthritis, right knee: Secondary | ICD-10-CM | POA: Insufficient documentation

## 2020-03-12 DIAGNOSIS — M549 Dorsalgia, unspecified: Secondary | ICD-10-CM | POA: Diagnosis not present

## 2020-03-12 NOTE — Assessment & Plan Note (Signed)
Still multifactorial.  Discussed with patient in great length about icing regimen and home exercise.  Has some underlying fibromyalgia likely.  We discussed iron supplementation to help with some of the cramping.  Patient does have likely some patellofemoral and patient is given a brace that I think will be more beneficial as well.  We discussed the potential for injection of the knee if necessary.  Follow-up with me again in 2 to 3 months.

## 2020-03-12 NOTE — Progress Notes (Signed)
[name] [address] [place] [address] Phone: [phone] Subjective:   I [name] am serving as a Neurosurgeon for Dr. Antoine Primas.  This visit occurred during the SARS-CoV-2 public health emergency.  Safety protocols were in place, including screening questions prior to the visit, additional usage of staff PPE, and extensive cleaning of exam room while observing appropriate contact time as indicated for disinfecting solutions.   I'm seeing this patient by the request  of:  [name], MD  CC: Back pain follow-up, knee pain  HCW:[id number]   12/06/2019 Back pain is multifactorial.  Has been some improvement and seems to respond very well to anti-inflammatories.  Patient is concerned of taking it regularly.  We discussed with patient about core strengthening and stability.  Patient will continue with the meloxicam at a lower dose discussed the potential for physical therapy as well.  Follow-up with me again in 6 to 8 weeks total time reviewing patient's chart, talking about my chart messages and talking with patient 33 minutes  Update 03/12/2020 Veronica Franklin is a 79 y.o. female coming in with complaint of low back pain. Patient states she has good and bad days. Patient states she is ok until later in the day and that is dependent on how much she has done for the day.  Patient states that the pain in the knee seems to be worse at night, continuing to have some mild swelling of the lower extremities.   Patient has had x-rays of the lumbar spine that were independently visualized by me today.  Most advanced from L2-L3 as well as at L5-S1 facet arthropathy noted  Past Medical History:  Diagnosis Date  . Diverticulosis   . Fibromyalgia   . GERD (gastroesophageal reflux disease)   . Hyperlipidemia   . Hypertension   . IBS (irritable bowel syndrome)   . Left fibular fracture 10/2013  . MVP (mitral valve prolapse)   . Patellar dislocation  10/2013   Left  . Patellar fracture    Left   Past Surgical History:  Procedure Laterality Date  . BILATERAL SALPINGOOPHORECTOMY  07/10/1998   Endometrosis and LOA  . BREAST BIOPSY Bilateral 3/10  . BREAST BIOPSY Right 1959  . BREAST BIOPSY Right 1970's & 1981  . CHOLECYSTECTOMY  02/2004  . COLONOSCOPY    . thigh lipoma Right    removed  . TOOTH EXTRACTION    . UMBILICAL HERNIA REPAIR  07/10/1998   at same time of Ophorectomy  . VAGINAL HYSTERECTOMY  1977   Social History   Socioeconomic History  . Marital status: Divorced    Spouse name: Not on file  . Number of children: 3  . Years of education: Not on file  . Highest education level: Not on file  Occupational History  . Not on file  Tobacco Use  . Smoking status: Never Smoker  . Smokeless tobacco: Never Used  Vaping Use  . Vaping Use: Never used  Substance and Sexual Activity  . Alcohol use: No  . Drug use: No  . Sexual activity: Not Currently    Partners: Male  Other Topics Concern  . Not on file  Social History Narrative  . Not on file   Social Determinants of Health   Financial Resource Strain:   . Difficulty of Paying Living Expenses: Not on file  Food Insecurity:   . Worried About Programme researcher, broadcasting/film/video in the Last Year: Not on file  .  Ran Out of Food in the Last Year: Not on file  Transportation Needs:   . Lack of Transportation (Medical): Not on file  . Lack of Transportation (Non-Medical): Not on file  Physical Activity:   . Days of Exercise per Week: Not on file  . Minutes of Exercise per Session: Not on file  Stress:   . Feeling of Stress : Not on file  Social Connections:   . Frequency of Communication with Friends and Family: Not on file  . Frequency of Social Gatherings with Friends and Family: Not on file  . Attends Religious Services: Not on file  . Active Member of Clubs or Organizations: Not on file  . Attends Banker Meetings: Not on file  . Marital Status: Not on file    Allergies  Allergen Reactions  . Levofloxacin     Muscle pain  Tendonitis   . Azithromycin Diarrhea    Extreme diarrhea  . Ciprofloxacin   . Codeine Nausea And Vomiting  . Demerol [Meperidine] Nausea And Vomiting  . Provera [Medroxyprogesterone Acetate]     unknown  . Sulfa Antibiotics Nausea And Vomiting  . Percocet [Oxycodone-Acetaminophen] Nausea And Vomiting   Family History  Problem Relation Age of Onset  . Heart failure Mother   . Ulcers Mother   . Hypertension Mother   . Pleurisy Father   . Cancer Father   . Cancer Maternal Aunt        uterine cancer  . Cancer Paternal Aunt        lymphoma  . Diabetes Maternal Grandmother   . Diverticulitis Son   . Diverticulitis Daughter      Current Outpatient Medications (Cardiovascular):  .  hydrochlorothiazide (HYDRODIURIL) 12.5 MG tablet,  .  propranolol (INDERAL) 40 MG tablet, Take 40 mg by mouth 2 (two) times daily.  Current Outpatient Medications (Respiratory):  Marland Kitchen  Chlorpheniramine Maleate (ALLERGY PO), Take by mouth. .  fluticasone (FLONASE) 50 MCG/ACT nasal spray,  .  montelukast (SINGULAIR) 10 MG tablet, Take 10 mg by mouth daily.  .  Pseudoephedrine HCl 15 MG/5ML LIQD, Take by mouth daily as needed. .  sodium chloride (OCEAN NASAL SPRAY) 0.65 % nasal spray, Place 1 spray into the nose as needed for congestion.  Current Outpatient Medications (Analgesics):  .  meloxicam (MOBIC) 7.5 MG tablet, TAKE 1 TABLET BY MOUTH EVERY DAY  Current Outpatient Medications (Hematological):  Marland Kitchen  Cyanocobalamin (B-12 PO), Take 1,000 mcg by mouth.  Current Outpatient Medications (Other):  Marland Kitchen  Cholecalciferol (VITAMIN D3) 2000 units TABS, Take by mouth daily. .  Coenzyme Q10 (CO Q-10 PO), Take by mouth. .  hyoscyamine (LEVBID) 0.375 MG 12 hr tablet, Take 1 tablet by mouth 2 (two) times daily. .  Magnesium 250 MG TABS, Take by mouth daily. Marland Kitchen  POTASSIUM GLUCONATE PO, Take 99 mg by mouth. .  Probiotic Product (PRO-BIOTIC BLEND PO),  Take by mouth daily. Marland Kitchen  TART CHERRY PO, Take by mouth. .  triamcinolone cream (KENALOG) 0.1 %, daily as needed. .  TURMERIC PO, Take by mouth. .  urea (GORDONS UREA) 40 % CREA, Apply 1 application topically daily.   Reviewed prior external information including notes and imaging from  primary care provider As well as notes that were available from care everywhere and other healthcare systems.  Past medical history, social, surgical and family history all reviewed in electronic medical record.  No pertanent information unless stated regarding to the chief complaint.   Review of Systems:  No headache, visual changes, nausea, vomiting, diarrhea, constipation, dizziness, abdominal pain, skin rash, fevers, chills, night sweats, weight loss, swollen lymph nodes, , chest pain, shortness of breath, mood changes. POSITIVE muscle aches, body aches, joint swelling and lower extremity swelling  Objective  Blood pressure 110/84, pulse 67, height 5' 4.5" (1.638 m), weight 159 lb (72.1 kg), last menstrual period 12/05/1975, SpO2 97 %.   General: No apparent distress alert and oriented x3 mood and affect normal, dressed appropriately.  HEENT: Pupils equal, extraocular movements intact  Respiratory: Patient's speak in full sentences and does not appear short of breath  Cardiovascular: N trace edema of the right lower extremity to mid calf compared to the contralateral side but negative Thompson. Neuro: Cranial nerves II through XII are intact, neurovascularly intact in all extremities with 2+ DTRs and 2+ pulses.  Gait normal with good balance and coordination.  MSK:   Low back exam shows loss of lordosis, tenderness to palpation diffusely.  Right knee exam shows the patient does have some lateral tracking of the patella noted with a positive patellar grind test.  Patient does have some mild tenderness over the medial joint line as well.    Impression and Recommendations:     The above documentation  has been reviewed and is accurate and complete Judi Saa, DO       Note: This dictation was prepared with Dragon dictation along with smaller phrase technology. Any transcriptional errors that result from this process are unintentional.

## 2020-03-12 NOTE — Assessment & Plan Note (Signed)
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  Did notice that patient did have a small effusion of the joint as well as some mild lower extremity edema but patient states that she has this from time to time and always goes away in the morning.  No further work-up necessary at this moment but encouraged compression.  Discussed icing regimen and home exercise, increase activity slowly.  Follow-up again in 4 to 8 weeks

## 2020-03-12 NOTE — Patient Instructions (Addendum)
Ferrous gluconate 65 mg with 500 mg vitamin C Xray of the right knee today I think the back is doing better  See me again in 2 months

## 2020-03-19 DIAGNOSIS — E781 Pure hyperglyceridemia: Secondary | ICD-10-CM | POA: Diagnosis not present

## 2020-03-19 DIAGNOSIS — I10 Essential (primary) hypertension: Secondary | ICD-10-CM | POA: Diagnosis not present

## 2020-04-08 DIAGNOSIS — I10 Essential (primary) hypertension: Secondary | ICD-10-CM | POA: Diagnosis not present

## 2020-04-08 DIAGNOSIS — E781 Pure hyperglyceridemia: Secondary | ICD-10-CM | POA: Diagnosis not present

## 2020-04-30 ENCOUNTER — Ambulatory Visit: Payer: Medicare PPO | Admitting: Podiatry

## 2020-04-30 ENCOUNTER — Other Ambulatory Visit: Payer: Self-pay

## 2020-04-30 DIAGNOSIS — M79674 Pain in right toe(s): Secondary | ICD-10-CM

## 2020-04-30 DIAGNOSIS — B351 Tinea unguium: Secondary | ICD-10-CM | POA: Diagnosis not present

## 2020-04-30 DIAGNOSIS — M79675 Pain in left toe(s): Secondary | ICD-10-CM

## 2020-04-30 DIAGNOSIS — Q828 Other specified congenital malformations of skin: Secondary | ICD-10-CM

## 2020-05-01 NOTE — Progress Notes (Signed)
Subjective: 79 y.o. returns the office today for painful, elongated, thickened toenails which she cannot trim herself.  She has no new concerns to her feet.  She was on meloxicam for right leg pain however she has stopped this due to reading side effects.  She had taken Tylenol as needed which is been helpful.  No recent changes otherwise. Denies any systemic complaints such as fevers, chills, nausea, vomiting.   PCP: [name], MD  Objective: AAO 3, NAD DP/PT pulses palpable, CRT less than 3 seconds Nails hypertrophic, dystrophic, elongated, brittle, discolored 10. There is tenderness overlying the nails 1-5 bilaterally. There is no surrounding erythema or drainage along the nail sites. There is mild hyperkeratotic lesion right foot submetatarsal 5.  There is no underlying ulceration drainage or any signs of infection. No other areas of discomfort today particular there is no pain on the plantar fascial, Achilles tendon.  There is no area of pinpoint tenderness.  MMT 5/5. No pain with calf compression, swelling, warmth, erythema.  Assessment: Patient presents with symptomatic onychomycosis; hyperkeratotic lesio  Plan: -Treatment options including alternatives, risks, complications were discussed -Nails sharply debrided 10 without complication/bleeding.  Urea cream for toenails -Debrided hyperkeratotic lesion x1 without any complications or bleeding -Continue with supportive shoe gear.  General stretching exercises to continue with as well. -Discussed daily foot inspection. If there are any changes, to call the office immediately.  -Follow-up in 3 months or sooner if any problems are to arise. In the meantime, encouraged to call the office with any questions, concerns, changes symptoms.  Ovid Curd, DPM

## 2020-05-07 ENCOUNTER — Other Ambulatory Visit: Payer: Self-pay | Admitting: Family Medicine

## 2020-05-07 DIAGNOSIS — K219 Gastro-esophageal reflux disease without esophagitis: Secondary | ICD-10-CM | POA: Diagnosis not present

## 2020-05-07 DIAGNOSIS — M8580 Other specified disorders of bone density and structure, unspecified site: Secondary | ICD-10-CM | POA: Diagnosis not present

## 2020-05-07 DIAGNOSIS — E781 Pure hyperglyceridemia: Secondary | ICD-10-CM | POA: Diagnosis not present

## 2020-05-07 DIAGNOSIS — I10 Essential (primary) hypertension: Secondary | ICD-10-CM | POA: Diagnosis not present

## 2020-05-13 NOTE — Progress Notes (Signed)
[name] [address] [place] [address] Phone: [phone] Subjective:   [name], am serving as a scribe for Dr. Antoine Primas. This visit occurred during the SARS-CoV-2 public health emergency.  Safety protocols were in place, including screening questions prior to the visit, additional usage of staff PPE, and extensive cleaning of exam room while observing appropriate contact time as indicated for disinfecting solutions.   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  Did notice that patient did have a small effusion of the joint as well as some mild lower extremity edema but patient states that she has this from time to time and always goes away in the morning.  No further work-up necessary at this moment but encouraged compression.  Discussed icing regimen and home exercise, increase activity slowly.  Follow-up again in 4 to 8 weeks  Still multifactorial.  Discussed with patient in great length about icing regimen and home exercise.  Has some underlying fibromyalgia likely.  We discussed iron supplementation to help with some of the cramping.  Patient does have likely some patellofemoral and patient is given a brace that I think will be more beneficial as well.  We discussed the potential for injection of the knee if necessary.  Follow-up with me again in 2 to 3 months  Update 05/13/2020 Veronica Franklin is a 79 y.o. female coming in with complaint of right knee pain. Patient states has used brace a couple of times with a lot of walking. Brace helped. States that at night she will wake up in pain. Pain during the day occurs near the end of the day and not every other day.   Did stop taking Meloxicam which she took every other day. Does feel it was helping but was concerned about taking the medication for too long. Has been using iron with  vitamin c.     Patient did have knee x-rays taken on March 12, 2020.  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  Past Medical History:  Diagnosis Date  . Diverticulosis   . Fibromyalgia   . GERD (gastroesophageal reflux disease)   . Hyperlipidemia   . Hypertension   . IBS (irritable bowel syndrome)   . Left fibular fracture 10/2013  . MVP (mitral valve prolapse)   . Patellar dislocation 10/2013   Left  . Patellar fracture    Left   Past Surgical History:  Procedure Laterality Date  . BILATERAL SALPINGOOPHORECTOMY  07/10/1998   Endometrosis and LOA  . BREAST BIOPSY Bilateral 3/10  . BREAST BIOPSY Right 1959  . BREAST BIOPSY Right 1970's & 1981  . CHOLECYSTECTOMY  02/2004  . COLONOSCOPY    . thigh lipoma Right    removed  . TOOTH EXTRACTION    . UMBILICAL HERNIA REPAIR  07/10/1998   at same time of Ophorectomy  . VAGINAL HYSTERECTOMY  1977   Social History   Socioeconomic History  . Marital status: Divorced    Spouse name: Not on file  . Number of children: 3  . Years of education: Not on file  . Highest education level: Not on file  Occupational History  . Not on file  Tobacco Use  . Smoking status: Never Smoker  . Smokeless tobacco: Never Used  Vaping Use  . Vaping Use: Never used  Substance and Sexual Activity  .  Alcohol use: No  . Drug use: No  . Sexual activity: Not Currently    Partners: Male  Other Topics Concern  . Not on file  Social History Narrative  . Not on file   Social Determinants of Health   Financial Resource Strain:   . Difficulty of Paying Living Expenses: Not on file  Food Insecurity:   . Worried About Programme researcher, broadcasting/film/video in the Last Year: Not on file  . Ran Out of Food in the Last Year: Not on file  Transportation Needs:   . Lack of Transportation (Medical): Not on file  . Lack of Transportation (Non-Medical): Not on file  Physical Activity:   . Days of Exercise per Week: Not on file  .  Minutes of Exercise per Session: Not on file  Stress:   . Feeling of Stress : Not on file  Social Connections:   . Frequency of Communication with Friends and Family: Not on file  . Frequency of Social Gatherings with Friends and Family: Not on file  . Attends Religious Services: Not on file  . Active Member of Clubs or Organizations: Not on file  . Attends Banker Meetings: Not on file  . Marital Status: Not on file   Allergies  Allergen Reactions  . Levofloxacin     Muscle pain  Tendonitis   . Azithromycin Diarrhea    Extreme diarrhea  . Ciprofloxacin   . Codeine Nausea And Vomiting  . Demerol [Meperidine] Nausea And Vomiting  . Provera [Medroxyprogesterone Acetate]     unknown  . Sulfa Antibiotics Nausea And Vomiting  . Percocet [Oxycodone-Acetaminophen] Nausea And Vomiting   Family History  Problem Relation Age of Onset  . Heart failure Mother   . Ulcers Mother   . Hypertension Mother   . Pleurisy Father   . Cancer Father   . Cancer Maternal Aunt        uterine cancer  . Cancer Paternal Aunt        lymphoma  . Diabetes Maternal Grandmother   . Diverticulitis Son   . Diverticulitis Daughter      Current Outpatient Medications (Cardiovascular):  .  hydrochlorothiazide (HYDRODIURIL) 12.5 MG tablet,  .  propranolol (INDERAL) 40 MG tablet, Take 40 mg by mouth 2 (two) times daily.  Current Outpatient Medications (Respiratory):  Marland Kitchen  Chlorpheniramine Maleate (ALLERGY PO), Take by mouth. .  fluticasone (FLONASE) 50 MCG/ACT nasal spray,  .  montelukast (SINGULAIR) 10 MG tablet, Take 10 mg by mouth daily.  .  Pseudoephedrine HCl 15 MG/5ML LIQD, Take by mouth daily as needed. .  sodium chloride (OCEAN NASAL SPRAY) 0.65 % nasal spray, Place 1 spray into the nose as needed for congestion.  Current Outpatient Medications (Analgesics):  .  meloxicam (MOBIC) 7.5 MG tablet, TAKE 1 TABLET BY MOUTH EVERY DAY  Current Outpatient Medications (Hematological):  Marland Kitchen   Cyanocobalamin (B-12 PO), Take 1,000 mcg by mouth.  Current Outpatient Medications (Other):  Marland Kitchen  Cholecalciferol (VITAMIN D3) 2000 units TABS, Take by mouth daily. .  Coenzyme Q10 (CO Q-10 PO), Take by mouth. .  hyoscyamine (LEVBID) 0.375 MG 12 hr tablet, Take 1 tablet by mouth 2 (two) times daily. .  Magnesium 250 MG TABS, Take by mouth daily. Marland Kitchen  POTASSIUM GLUCONATE PO, Take 99 mg by mouth. .  Probiotic Product (PRO-BIOTIC BLEND PO), Take by mouth daily. Marland Kitchen  TART CHERRY PO, Take by mouth. .  triamcinolone cream (KENALOG) 0.1 %, daily as needed. Marland Kitchen  TURMERIC PO, Take by mouth. .  urea (GORDONS UREA) 40 % CREA, Apply 1 application topically daily.   Reviewed prior external information including notes and imaging from  primary care provider As well as notes that were available from care everywhere and other healthcare systems.  Past medical history, social, surgical and family history all reviewed in electronic medical record.  No pertanent information unless stated regarding to the chief complaint.   Review of Systems:  No headache, visual changes, nausea, vomiting, diarrhea, constipation, dizziness, abdominal pain, skin rash, fevers, chills, night sweats, weight loss, swollen lymph nodes, body aches, joint swelling, chest pain, shortness of breath, mood changes.   Objective  Blood pressure 106/68, pulse 78, height 5' 4.5" (1.638 m), weight 158 lb (71.7 kg), last menstrual period 12/05/1975, SpO2 98 %.   General: No apparent distress alert and oriented x3 mood and affect normal, dressed appropriately.  HEENT: Pupils equal, extraocular movements intact  Respiratory: Patient's speak in full sentences and does not appear short of breath  Cardiovascular: No lower extremity edema, non tender, no erythema  Right knee does show some arthritic changes.  Mild lateral tracking of the patella noted.  Patient does have tenderness to palpation over the patellofemoral and the medial joint space.   Patient does have arthritic changes.  Lacks last 5 degrees of extension.    Impression and Recommendations:     The above documentation has been reviewed and is accurate and complete Judi Saa, DO

## 2020-05-14 ENCOUNTER — Encounter: Payer: Self-pay | Admitting: Family Medicine

## 2020-05-14 ENCOUNTER — Other Ambulatory Visit: Payer: Self-pay

## 2020-05-14 ENCOUNTER — Ambulatory Visit: Payer: Medicare PPO | Admitting: Family Medicine

## 2020-05-14 DIAGNOSIS — M1711 Unilateral primary osteoarthritis, right knee: Secondary | ICD-10-CM

## 2020-05-14 NOTE — Assessment & Plan Note (Signed)
Patient does have arthritic changes noted.  We did discuss with patient about the potential for injection.  Patient though is going to be getting her booster and her flu shot and wants to hold on that at this moment.  I do think she would be also a candidate for doing formal physical therapy.  Continue the bracing.  Topical anti-inflammatories given.  Patient will continue with the vitamin supplementations and follow-up again in 4 to 6 weeks

## 2020-05-14 NOTE — Patient Instructions (Signed)
I like plan on vaccines Pennsaid 2x a day Arnica to feet for cramping Continue iron See me in 4-6 weeks to talk about injections and physical therapy

## 2020-05-17 ENCOUNTER — Other Ambulatory Visit: Payer: Self-pay | Admitting: Family Medicine

## 2020-05-20 ENCOUNTER — Other Ambulatory Visit: Payer: Self-pay | Admitting: Family Medicine

## 2020-05-20 DIAGNOSIS — R935 Abnormal findings on diagnostic imaging of other abdominal regions, including retroperitoneum: Secondary | ICD-10-CM

## 2020-05-20 DIAGNOSIS — R911 Solitary pulmonary nodule: Secondary | ICD-10-CM

## 2020-05-22 DIAGNOSIS — R3914 Feeling of incomplete bladder emptying: Secondary | ICD-10-CM | POA: Diagnosis not present

## 2020-05-22 DIAGNOSIS — D49512 Neoplasm of unspecified behavior of left kidney: Secondary | ICD-10-CM | POA: Diagnosis not present

## 2020-05-23 DIAGNOSIS — L821 Other seborrheic keratosis: Secondary | ICD-10-CM | POA: Diagnosis not present

## 2020-05-23 DIAGNOSIS — Z85828 Personal history of other malignant neoplasm of skin: Secondary | ICD-10-CM | POA: Diagnosis not present

## 2020-05-23 DIAGNOSIS — L820 Inflamed seborrheic keratosis: Secondary | ICD-10-CM | POA: Diagnosis not present

## 2020-05-23 DIAGNOSIS — D1801 Hemangioma of skin and subcutaneous tissue: Secondary | ICD-10-CM | POA: Diagnosis not present

## 2020-05-23 DIAGNOSIS — L570 Actinic keratosis: Secondary | ICD-10-CM | POA: Diagnosis not present

## 2020-06-08 ENCOUNTER — Other Ambulatory Visit: Payer: Self-pay | Admitting: Family Medicine

## 2020-06-10 ENCOUNTER — Ambulatory Visit
Admission: RE | Admit: 2020-06-10 | Discharge: 2020-06-10 | Disposition: A | Discharge status: Home / Self Care | Payer: Medicare PPO | Source: Ambulatory Visit | Attending: Family Medicine | Admitting: Family Medicine

## 2020-06-10 DIAGNOSIS — R935 Abnormal findings on diagnostic imaging of other abdominal regions, including retroperitoneum: Secondary | ICD-10-CM

## 2020-06-10 DIAGNOSIS — R918 Other nonspecific abnormal finding of lung field: Secondary | ICD-10-CM | POA: Diagnosis not present

## 2020-06-10 DIAGNOSIS — R911 Solitary pulmonary nodule: Secondary | ICD-10-CM

## 2020-06-18 ENCOUNTER — Other Ambulatory Visit: Payer: Self-pay

## 2020-06-18 ENCOUNTER — Ambulatory Visit: Payer: Medicare PPO | Admitting: Family Medicine

## 2020-06-18 ENCOUNTER — Encounter: Payer: Self-pay | Admitting: Family Medicine

## 2020-06-18 DIAGNOSIS — M1711 Unilateral primary osteoarthritis, right knee: Secondary | ICD-10-CM | POA: Diagnosis not present

## 2020-06-18 NOTE — Patient Instructions (Addendum)
Good to see you Glucosamine 1500 mg daily if you want to consider Keep up everything else  See me again in 3 months

## 2020-06-18 NOTE — Progress Notes (Signed)
[facility] [address] [place] [address] Phone: [phone] Subjective:   .,serving  This visit occurred during the SARS-CoV-2 public health emergency.  Safety protocols were in place, including screening questions prior to the visit, additional usage of staff PPE, and extensive cleaning of exam room while observing appropriate contact time as indicated for disinfecting solutions.   I'm seeing this patient by the request  of:  [name], MD  CC: Right knee pain follow-up  [id number]   05/14/2020 Patient does have arthritic changes noted.  We did discuss with patient about the potential for injection.  Patient though is going to be getting her booster and her flu shot and wants to hold on that at this moment.  I do think she would be also a candidate for doing formal physical therapy.  Continue the bracing.  Topical anti-inflammatories given.  Patient will continue with the vitamin supplementations and follow-up again in 4 to 6 weeks  Update 06/18/2020 Ver[facility] is a 79 y.o. female coming in with complaint of right knee pain. Patient states that the knee is doing well today. Has had good and bad days and states she has more good days than bad. Has been weeks since she has had night pain. Patient states "the leg is really funny" on the right side. States it does not feel right. States it feels like a tingling sensation that is very uncomfortable. Hasn't been using meloxicam since her last visit.  Patient states actually for the last 2 weeks has noticed significant improvement      Past Medical History:  Diagnosis Date  . Diverticulosis   . Fibromyalgia   . GERD (gastroesophageal reflux disease)   . Hyperlipidemia   . Hypertension   . IBS (irritable bowel syndrome)   . Left fibular fracture 10/2013  . MVP (mitral valve prolapse)   . Patellar dislocation 10/2013   Left  . Patellar fracture    Left   Past Surgical History:   Procedure Laterality Date  . BILATERAL SALPINGOOPHORECTOMY  07/10/1998   Endometrosis and LOA  . BREAST BIOPSY Bilateral 3/10  . BREAST BIOPSY Right 1959  . BREAST BIOPSY Right 1970's & 1981  . CHOLECYSTECTOMY  02/2004  . COLONOSCOPY    . thigh lipoma Right    removed  . TOOTH EXTRACTION    . UMBILICAL HERNIA REPAIR  07/10/1998   at same time of Ophorectomy  . VAGINAL HYSTERECTOMY  1977   Social History   Socioeconomic History  . Marital status: Divorced    Spouse name: Not on file  . Number of children: 3  . Years of education: Not on file  . Highest education level: Not on file  Occupational History  . Not on file  Tobacco Use  . Smoking status: Never Smoker  . Smokeless tobacco: Never Used  Vaping Use  . Vaping Use: Never used  Substance and Sexual Activity  . Alcohol use: No  . Drug use: No  . Sexual activity: Not Currently    Partners: Male  Other Topics Concern  . Not on file  Social History Narrative  . Not on file   Social Determinants of Health   Financial Resource Strain: Not on file  Food Insecurity: Not on file  Transportation Needs: Not on file  Physical Activity: Not on file  Stress: Not on file  Social Connections: Not on file   Allergies  Allergen Reactions  . Levofloxacin  Muscle pain  Tendonitis   . Azithromycin Diarrhea    Extreme diarrhea  . Ciprofloxacin   . Codeine Nausea And Vomiting  . Demerol [Meperidine] Nausea And Vomiting  . Provera [Medroxyprogesterone Acetate]     unknown  . Sulfa Antibiotics Nausea And Vomiting  . Percocet [Oxycodone-Acetaminophen] Nausea And Vomiting   Family History  Problem Relation Age of Onset  . Heart failure Mother   . Ulcers Mother   . Hypertension Mother   . Pleurisy Father   . Cancer Father   . Cancer Maternal Aunt        uterine cancer  . Cancer Paternal Aunt        lymphoma  . Diabetes Maternal Grandmother   . Diverticulitis Son   . Diverticulitis Daughter      Current  Outpatient Medications (Cardiovascular):  .  hydrochlorothiazide (HYDRODIURIL) 12.5 MG tablet,  .  propranolol (INDERAL) 40 MG tablet, Take 40 mg by mouth 2 (two) times daily.  Current Outpatient Medications (Respiratory):  Marland Kitchen  Chlorpheniramine Maleate (ALLERGY PO), Take by mouth. .  fluticasone (FLONASE) 50 MCG/ACT nasal spray,  .  montelukast (SINGULAIR) 10 MG tablet, Take 10 mg by mouth daily.  .  Pseudoephedrine HCl 15 MG/5ML LIQD, Take by mouth daily as needed. .  sodium chloride (OCEAN) 0.65 % nasal spray, Place 1 spray into the nose as needed for congestion.  Current Outpatient Medications (Analgesics):  .  meloxicam (MOBIC) 7.5 MG tablet, TAKE 1 TABLET BY MOUTH EVERY DAY  Current Outpatient Medications (Hematological):  Marland Kitchen  Cyanocobalamin (B-12 PO), Take 1,000 mcg by mouth.  Current Outpatient Medications (Other):  Marland Kitchen  Cholecalciferol (VITAMIN D3) 2000 units TABS, Take by mouth daily. .  Coenzyme Q10 (CO Q-10 PO), Take by mouth. .  hyoscyamine (LEVBID) 0.375 MG 12 hr tablet, Take 1 tablet by mouth 2 (two) times daily. .  Magnesium 250 MG TABS, Take by mouth daily. Marland Kitchen  POTASSIUM GLUCONATE PO, Take 99 mg by mouth. .  Probiotic Product (PRO-BIOTIC BLEND PO), Take by mouth daily. Marland Kitchen  TART CHERRY PO, Take by mouth. .  triamcinolone cream (KENALOG) 0.1 %, daily as needed. .  TURMERIC PO, Take by mouth. .  urea (GORDONS UREA) 40 % CREA, Apply 1 application topically daily.   Reviewed prior external information including notes and imaging from  primary care provider As well as notes that were available from care everywhere and other healthcare systems.  Past medical history, social, surgical and family history all reviewed in electronic medical record.  No pertanent information unless stated regarding to the chief complaint.   Review of Systems:  No headache, visual changes, nausea, vomiting, diarrhea, constipation, dizziness, abdominal pain, skin rash, fevers, chills, night sweats,  weight loss, swollen lymph nodes, body aches, joint swelling, chest pain, shortness of breath, mood changes. POSITIVE muscle aches  Objective  Blood pressure 130/80, pulse 66, height 5' 4.5" (1.638 m), weight 156 lb (70.8 kg), last menstrual period 12/05/1975, SpO2 98 %.   General: No apparent distress alert and oriented x3 mood and affect normal, dressed appropriately.  HEENT: Pupils equal, extraocular movements intact  Respiratory: Patient's speak in full sentences and does not appear short of breath  Cardiovascular: No lower extremity edema, non tender, no erythema  Patient does have arthritic changes of multiple joints Right knee exam shows lateral tracking of the patella noted.  Patient does have crepitus noted.  Mild pain noted with lateral tracking.  Patient has no have full range of motion.  Mild instability noted with valgus and varus force.    Impression and Recommendations:     The above documentation has been reviewed and is accurate and complete Judi Saa, DO

## 2020-06-18 NOTE — Assessment & Plan Note (Signed)
Patient is doing well with conservative therapy at this time.  We discussed icing regimen and home exercises, continue to stay active.  We discussed the bracing still.  Patient will continue to try to be active otherwise and follow-up with me again in 3 months.  Worsening pain we can see her again and attempt injections.

## 2020-06-21 ENCOUNTER — Ambulatory Visit: Payer: Medicare Other | Admitting: Obstetrics & Gynecology

## 2020-07-05 DIAGNOSIS — E781 Pure hyperglyceridemia: Secondary | ICD-10-CM | POA: Diagnosis not present

## 2020-07-05 DIAGNOSIS — K219 Gastro-esophageal reflux disease without esophagitis: Secondary | ICD-10-CM | POA: Diagnosis not present

## 2020-07-05 DIAGNOSIS — I10 Essential (primary) hypertension: Secondary | ICD-10-CM | POA: Diagnosis not present

## 2020-07-05 DIAGNOSIS — M8580 Other specified disorders of bone density and structure, unspecified site: Secondary | ICD-10-CM | POA: Diagnosis not present

## 2020-07-08 ENCOUNTER — Ambulatory Visit: Payer: Medicare PPO | Admitting: Podiatry

## 2020-07-09 ENCOUNTER — Ambulatory Visit: Payer: Medicare PPO | Admitting: Podiatry

## 2020-07-09 ENCOUNTER — Other Ambulatory Visit: Payer: Self-pay

## 2020-07-09 DIAGNOSIS — B351 Tinea unguium: Secondary | ICD-10-CM

## 2020-07-09 DIAGNOSIS — M79675 Pain in left toe(s): Secondary | ICD-10-CM | POA: Diagnosis not present

## 2020-07-09 DIAGNOSIS — M79674 Pain in right toe(s): Secondary | ICD-10-CM

## 2020-07-09 DIAGNOSIS — Q828 Other specified congenital malformations of skin: Secondary | ICD-10-CM

## 2020-07-10 NOTE — Progress Notes (Signed)
Subjective: 80 y.o. returns the office today for painful, elongated, thickened toenails which she cannot trim herself and for calluses. She is asking about various bunion pads. Denies any systemic complaints such as fevers, chills, nausea, vomiting.   PCP: Veronica Floro, MD  Objective: AAO 3, NAD DP/PT pulses palpable, CRT less than 3 seconds Nails hypertrophic, dystrophic, elongated, brittle, discolored 10. There is tenderness overlying the nails 1-5 bilaterally. There is no surrounding erythema or drainage along the nail sites. There is hyperkeratotic lesion bilateral foot submetatarsal 5.  There is no underlying ulceration drainage or any signs of infection. No other areas of discomfort today particular there is no pain on the plantar fascial, Achilles tendon.  There is no area of pinpoint tenderness.  MMT 5/5. No pain with calf compression, swelling, warmth, erythema.  Assessment: Patient presents with symptomatic onychomycosis; hyperkeratotic lesions  Plan: -Treatment options including alternatives, risks, complications were discussed -Nails sharply debrided 10 without complication/bleeding.  Urea cream for toenails -Debrided hyperkeratotic lesion x2 without any complications or bleeding -Dispensed a bunion pad/toe separator.  -Discussed daily foot inspection. If there are any changes, to call the office immediately.  -Follow-up in 3 months or sooner if any problems are to arise. In the meantime, encouraged to call the office with any questions, concerns, changes symptoms.  Ovid Curd, DPM

## 2020-07-28 DIAGNOSIS — R059 Cough, unspecified: Secondary | ICD-10-CM | POA: Diagnosis not present

## 2020-07-28 DIAGNOSIS — U071 COVID-19: Secondary | ICD-10-CM | POA: Diagnosis not present

## 2020-08-15 ENCOUNTER — Telehealth (HOSPITAL_BASED_OUTPATIENT_CLINIC_OR_DEPARTMENT_OTHER): Payer: Self-pay | Admitting: Obstetrics & Gynecology

## 2020-08-15 NOTE — Telephone Encounter (Signed)
Called patient an left message to please call the office back.

## 2020-09-10 ENCOUNTER — Ambulatory Visit: Payer: Medicare PPO | Admitting: Podiatry

## 2020-09-10 ENCOUNTER — Other Ambulatory Visit: Payer: Self-pay

## 2020-09-10 DIAGNOSIS — Q828 Other specified congenital malformations of skin: Secondary | ICD-10-CM

## 2020-09-10 DIAGNOSIS — M79674 Pain in right toe(s): Secondary | ICD-10-CM | POA: Diagnosis not present

## 2020-09-10 DIAGNOSIS — M79675 Pain in left toe(s): Secondary | ICD-10-CM | POA: Diagnosis not present

## 2020-09-10 DIAGNOSIS — B351 Tinea unguium: Secondary | ICD-10-CM | POA: Diagnosis not present

## 2020-09-10 NOTE — Progress Notes (Signed)
Subjective: 80 y.o. returns the office today for painful, elongated, thickened toenails which she cannot trim herself and for calluses. She is asking about various bunion pads. Denies any systemic complaints such as fevers, chills, nausea, vomiting.   Since last saw her she did test positive for COVID but has recovered well.  PCP: [name], MD  Objective: AAO 3, NAD DP/PT pulses palpable, CRT less than 3 seconds Nails hypertrophic, dystrophic, elongated, brittle, discolored 10. There is tenderness overlying the nails 1-5 bilaterally. There is no surrounding erythema or drainage along the nail sites. There is hyperkeratotic lesion bilateral foot submetatarsal 5.  There is no underlying ulceration drainage or any signs of infection. No other areas of discomfort today particular there is no pain on the plantar fascial, Achilles tendon.  There is no area of pinpoint tenderness.  MMT 5/5. No pain with calf compression, swelling, warmth, erythema.  Assessment: Patient presents with symptomatic onychomycosis; hyperkeratotic lesions  Plan: -Treatment options including alternatives, risks, complications were discussed -Nails sharply debrided 10 without complication/bleeding.  Urea cream for toenails -Debrided hyperkeratotic lesion x2 without any complications or bleeding -Discussed daily foot inspection. If there are any changes, to call the office immediately.  -Follow-up in 3 months or sooner if any problems are to arise. In the meantime, encouraged to call the office with any questions, concerns, changes symptoms.  Ovid Curd, DPM

## 2020-09-17 ENCOUNTER — Other Ambulatory Visit: Payer: Self-pay

## 2020-09-17 ENCOUNTER — Ambulatory Visit: Payer: Medicare PPO | Admitting: Family Medicine

## 2020-09-17 ENCOUNTER — Encounter: Payer: Self-pay | Admitting: Family Medicine

## 2020-09-17 DIAGNOSIS — M1711 Unilateral primary osteoarthritis, right knee: Secondary | ICD-10-CM | POA: Diagnosis not present

## 2020-09-17 NOTE — Progress Notes (Signed)
[name] [address] [place] [address] Phone: [phone] Subjective:   [facility], am serving as a scribe for Dr. Antoine Primas. This visit occurred during the SARS-CoV-2 public health emergency.  Safety protocols were in place, including screening questions prior to the visit, additional usage of staff PPE, and extensive cleaning of exam room while observing appropriate contact time as indicated for disinfecting solutions.   I'm seeing this patient by the request  of:  Veronica Floro, MD  CC: Right knee pain  [id number]   06/18/2020 Patient is doing well with conservative therapy at this time.  We discussed icing regimen and home exercises, continue to stay active.  We discussed the bracing still.  Patient will continue to try to be active otherwise and follow-up with me again in 3 months.  Worsening pain we can see her again and attempt injections.  Update 09/17/2020 [facility] is a 80 y.o. female coming in with complaint of left knee pain. Patient states that she has been doing somewhat better. Did have some pain with driving today. Pain is no longer radiating up her leg.   Patient notes having COVID in January.  Patient has recovered well though.   O     Past Medical History:  Diagnosis Date  . Diverticulosis   . Fibromyalgia   . GERD (gastroesophageal reflux disease)   . Hyperlipidemia   . Hypertension   . IBS (irritable bowel syndrome)   . Left fibular fracture 10/2013  . MVP (mitral valve prolapse)   . Patellar dislocation 10/2013   Left  . Patellar fracture    Left   Past Surgical History:  Procedure Laterality Date  . BILATERAL SALPINGOOPHORECTOMY  07/10/1998   Endometrosis and LOA  . BREAST BIOPSY Bilateral 3/10  . BREAST BIOPSY Right 1959  . BREAST BIOPSY Right 1970's & 1981  . CHOLECYSTECTOMY  02/2004  . COLONOSCOPY    . thigh lipoma Right    removed  . TOOTH EXTRACTION    . UMBILICAL HERNIA  REPAIR  07/10/1998   at same time of Ophorectomy  . VAGINAL HYSTERECTOMY  1977   Social History   Socioeconomic History  . Marital status: Divorced    Spouse name: Not on file  . Number of children: 3  . Years of education: Not on file  . Highest education level: Not on file  Occupational History  . Not on file  Tobacco Use  . Smoking status: Never Smoker  . Smokeless tobacco: Never Used  Vaping Use  . Vaping Use: Never used  Substance and Sexual Activity  . Alcohol use: No  . Drug use: No  . Sexual activity: Not Currently    Partners: Male  Other Topics Concern  . Not on file  Social History Narrative  . Not on file   Social Determinants of Health   Financial Resource Strain: Not on file  Food Insecurity: Not on file  Transportation Needs: Not on file  Physical Activity: Not on file  Stress: Not on file  Social Connections: Not on file   Allergies  Allergen Reactions  . Levofloxacin     Muscle pain  Tendonitis   . Azithromycin Diarrhea    Extreme diarrhea  . Ciprofloxacin   . Codeine Nausea And Vomiting  . Demerol [Meperidine] Nausea And Vomiting  . Provera [Medroxyprogesterone Acetate]     unknown  . Sulfa Antibiotics Nausea And Vomiting  . Percocet [Oxycodone-Acetaminophen] Nausea And  Vomiting   Family History  Problem Relation Age of Onset  . Heart failure Mother   . Ulcers Mother   . Hypertension Mother   . Pleurisy Father   . Cancer Father   . Cancer Maternal Aunt        uterine cancer  . Cancer Paternal Aunt        lymphoma  . Diabetes Maternal Grandmother   . Diverticulitis Son   . Diverticulitis Daughter      Current Outpatient Medications (Cardiovascular):  .  hydrochlorothiazide (HYDRODIURIL) 12.5 MG tablet,  .  propranolol (INDERAL) 40 MG tablet, Take 40 mg by mouth 2 (two) times daily.  Current Outpatient Medications (Respiratory):  .  fluticasone (FLOVENT DISKUS) 50 MCG/BLIST diskus inhaler, Inhale 1 puff into the lungs 2 (two)  times daily. .  montelukast (SINGULAIR) 10 MG tablet, Take 10 mg by mouth daily.  .  sodium chloride (OCEAN) 0.65 % nasal spray, Place 1 spray into the nose as needed for congestion.   Current Outpatient Medications (Hematological):  Marland Kitchen  Cyanocobalamin (B-12 PO), Take 1,000 mcg by mouth. .  Ferrous Sulfate (IRON) 325 (65 Fe) MG TABS, Take by mouth.  Current Outpatient Medications (Other):  Marland Kitchen  Cholecalciferol (VITAMIN D3) 2000 units TABS, Take by mouth daily. .  Coenzyme Q10 (CO Q-10 PO), Take by mouth. .  hyoscyamine (LEVBID) 0.375 MG 12 hr tablet, Take 1 tablet by mouth 2 (two) times daily. .  Magnesium 250 MG TABS, Take by mouth daily. Marland Kitchen  POTASSIUM GLUCONATE PO, Take 99 mg by mouth. .  Probiotic Product (PRO-BIOTIC BLEND PO), Take by mouth daily. .  Quercetin 250 MG TABS, Take by mouth. Marland Kitchen  TART CHERRY PO, Take by mouth. .  triamcinolone cream (KENALOG) 0.1 %, daily as needed. .  TURMERIC PO, Take by mouth. .  vitamin C (ASCORBIC ACID) 500 MG tablet, Take 500 mg by mouth daily. Marland Kitchen  zinc gluconate 50 MG tablet, Take 50 mg by mouth daily.   Reviewed prior external information including notes and imaging from  primary care provider As well as notes that were available from care everywhere and other healthcare systems.  Past medical history, social, surgical and family history all reviewed in electronic medical record.  No pertanent information unless stated regarding to the chief complaint.   Review of Systems:  No headache, visual changes, nausea, vomiting, diarrhea, constipation, dizziness, abdominal pain, skin rash, fevers, chills, night sweats, weight loss, swollen lymph nodes, body aches, joint swelling, chest pain, shortness of breath, mood changes. POSITIVE muscle aches  Objective  Blood pressure 110/82, pulse 70, height 5' 4.5" (1.638 m), weight 157 lb (71.2 kg), last menstrual period 12/05/1975, SpO2 96 %.   General: No apparent distress alert and oriented x3 mood and affect  normal, dressed appropriately.  HEENT: Pupils equal, extraocular movements intact  Respiratory: Patient's speak in full sentences and does not appear short of breath  Cardiovascular: No lower extremity edema, non tender, no erythema  Gait normal with good balance and coordination.  MSK: Mild arthritic changes of multiple joints Right knee exam still has some mild arthritic changes.  Patient does have some patellar grind noted.  Lateral tracking of the patella noted.  Some instability at this joint.  Full range of motion no noted.    Impression and Recommendations:    The above documentation has been reviewed and is accurate and complete Judi Saa, DO

## 2020-09-17 NOTE — Assessment & Plan Note (Signed)
Patient is doing very well overall with conservative therapy.  Still has not done any true injection.  Patient is doing well we can consider changing if necessary injections.  Patient will follow up again in 3 to 4 months

## 2020-09-17 NOTE — Patient Instructions (Signed)
Good to see you Glad the knee is doing well Thanks for the pillow option See me again in 3-4 months

## 2020-10-03 DIAGNOSIS — I700 Atherosclerosis of aorta: Secondary | ICD-10-CM | POA: Diagnosis not present

## 2020-10-03 DIAGNOSIS — J309 Allergic rhinitis, unspecified: Secondary | ICD-10-CM | POA: Diagnosis not present

## 2020-10-03 DIAGNOSIS — E781 Pure hyperglyceridemia: Secondary | ICD-10-CM | POA: Diagnosis not present

## 2020-10-03 DIAGNOSIS — M8580 Other specified disorders of bone density and structure, unspecified site: Secondary | ICD-10-CM | POA: Diagnosis not present

## 2020-10-03 DIAGNOSIS — Z0000 Encounter for general adult medical examination without abnormal findings: Secondary | ICD-10-CM | POA: Diagnosis not present

## 2020-10-03 DIAGNOSIS — K589 Irritable bowel syndrome without diarrhea: Secondary | ICD-10-CM | POA: Diagnosis not present

## 2020-10-03 DIAGNOSIS — I10 Essential (primary) hypertension: Secondary | ICD-10-CM | POA: Diagnosis not present

## 2020-10-03 DIAGNOSIS — Z23 Encounter for immunization: Secondary | ICD-10-CM | POA: Diagnosis not present

## 2020-10-03 DIAGNOSIS — R159 Full incontinence of feces: Secondary | ICD-10-CM | POA: Diagnosis not present

## 2020-10-24 DIAGNOSIS — M8589 Other specified disorders of bone density and structure, multiple sites: Secondary | ICD-10-CM | POA: Diagnosis not present

## 2020-10-29 DIAGNOSIS — K5730 Diverticulosis of large intestine without perforation or abscess without bleeding: Secondary | ICD-10-CM | POA: Diagnosis not present

## 2020-10-29 DIAGNOSIS — K760 Fatty (change of) liver, not elsewhere classified: Secondary | ICD-10-CM | POA: Diagnosis not present

## 2020-10-29 DIAGNOSIS — R159 Full incontinence of feces: Secondary | ICD-10-CM | POA: Diagnosis not present

## 2020-10-29 DIAGNOSIS — K580 Irritable bowel syndrome with diarrhea: Secondary | ICD-10-CM | POA: Diagnosis not present

## 2020-10-31 DIAGNOSIS — I10 Essential (primary) hypertension: Secondary | ICD-10-CM | POA: Diagnosis not present

## 2020-10-31 DIAGNOSIS — M8580 Other specified disorders of bone density and structure, unspecified site: Secondary | ICD-10-CM | POA: Diagnosis not present

## 2020-10-31 DIAGNOSIS — E781 Pure hyperglyceridemia: Secondary | ICD-10-CM | POA: Diagnosis not present

## 2020-10-31 DIAGNOSIS — K219 Gastro-esophageal reflux disease without esophagitis: Secondary | ICD-10-CM | POA: Diagnosis not present

## 2020-11-11 ENCOUNTER — Ambulatory Visit: Payer: Medicare PPO | Admitting: Podiatry

## 2020-11-11 ENCOUNTER — Other Ambulatory Visit: Payer: Self-pay

## 2020-11-11 DIAGNOSIS — B351 Tinea unguium: Secondary | ICD-10-CM | POA: Diagnosis not present

## 2020-11-11 DIAGNOSIS — M79674 Pain in right toe(s): Secondary | ICD-10-CM | POA: Diagnosis not present

## 2020-11-11 DIAGNOSIS — M79675 Pain in left toe(s): Secondary | ICD-10-CM

## 2020-11-14 DIAGNOSIS — Z20828 Contact with and (suspected) exposure to other viral communicable diseases: Secondary | ICD-10-CM | POA: Diagnosis not present

## 2020-11-15 DIAGNOSIS — Z20822 Contact with and (suspected) exposure to covid-19: Secondary | ICD-10-CM | POA: Diagnosis not present

## 2020-11-16 NOTE — Progress Notes (Signed)
Subjective: 80 y.o. returns the office today for painful, elongated, thickened toenails which she cannot trim herself and for calluses. She is asking about various bunion pads. Denies any systemic complaints such as fevers, chills, nausea, vomiting.   Last night she had a 10-minute period where she had numbness in the lateral aspect of her right leg.  She not sure what started this but she does cross her legs when she sits but no back pain or radiating pain.  No falls. PCP: [name], MD  Objective: AAO 3, NAD DP/PT pulses palpable, CRT less than 3 seconds Nails hypertrophic, dystrophic, elongated, brittle, discolored 10. There is tenderness overlying the nails 1-5 bilaterally. There is no surrounding erythema or drainage along the nail sites. There is minimal hyperkeratotic lesion bilateral foot submetatarsal 5.  There is no underlying ulceration drainage or any signs of infection. There is no numbness identified today there is no burning sensations.  Sensation intact. No other areas of discomfort today particular there is no pain on the plantar fascial, Achilles tendon.  There is no area of pinpoint tenderness.  MMT 5/5. No pain with calf compression, swelling, warmth, erythema.  Assessment: Patient presents with symptomatic onychomycosis; hyperkeratotic lesions  Plan: -Treatment options including alternatives, risks, complications were discussed -Nails sharply debrided 10 without complication/bleeding.  Urea cream for toenails -Minimal hyperkeratotic lesion sharply x2 without any complications or bleeding -Will monitor the area that she had numbness on the right side yesterday with this was a one-time short recurrent incident.  If there is any continuation or worsening will further evaluate but I think this could have been caused from a localized nerve compression. -Discussed daily foot inspection. If there are any changes, to call the office immediately.  -Follow-up in 3  months or sooner if any problems are to arise. In the meantime, encouraged to call the office with any questions, concerns, changes symptoms.  Ovid Curd, DPM

## 2020-11-20 DIAGNOSIS — D1801 Hemangioma of skin and subcutaneous tissue: Secondary | ICD-10-CM | POA: Diagnosis not present

## 2020-11-20 DIAGNOSIS — L814 Other melanin hyperpigmentation: Secondary | ICD-10-CM | POA: Diagnosis not present

## 2020-11-20 DIAGNOSIS — L570 Actinic keratosis: Secondary | ICD-10-CM | POA: Diagnosis not present

## 2020-11-20 DIAGNOSIS — B078 Other viral warts: Secondary | ICD-10-CM | POA: Diagnosis not present

## 2020-11-20 DIAGNOSIS — Z85828 Personal history of other malignant neoplasm of skin: Secondary | ICD-10-CM | POA: Diagnosis not present

## 2020-11-20 DIAGNOSIS — L820 Inflamed seborrheic keratosis: Secondary | ICD-10-CM | POA: Diagnosis not present

## 2020-11-20 DIAGNOSIS — L821 Other seborrheic keratosis: Secondary | ICD-10-CM | POA: Diagnosis not present

## 2020-11-20 DIAGNOSIS — D225 Melanocytic nevi of trunk: Secondary | ICD-10-CM | POA: Diagnosis not present

## 2020-12-17 NOTE — Progress Notes (Signed)
[name] [address] [place] [address] Phone: [phone] Subjective:   [name], am serving as a scribe for Dr. Antoine Primas. This visit occurred during the SARS-CoV-2 public health emergency.  Safety protocols were in place, including screening questions prior to the visit, additional usage of staff PPE, and extensive cleaning of exam room while observing appropriate contact time as indicated for disinfecting solutions.   I'm seeing this patient by the request  of:  [name], MD  CC: Knee pain follow-up  NIO:[id number]  09/17/2020 Patient is doing very well overall with conservative therapy.  Still has not done any true injection.  Patient is doing well we can consider changing if necessary injections.  Patient will follow up again in 3 to 4 months  Update 12/18/2020 Veronica Franklin is a 80 y.o. female coming in with complaint of R knee pain.  Found to have more patellofemoral arthritis.  Patient states that her pain has improved. Notes that last year she would walk 1/4 mile to the beach and this walk is much less painful. Had some cramping in quad on Saturday evening. Went to bed with daytime clothes on it was so painful. No pain the following morning. Does have cramping in ankles more frequently.    Past Medical History:  Diagnosis Date   Diverticulosis    Fibromyalgia    GERD (gastroesophageal reflux disease)    Hyperlipidemia    Hypertension    IBS (irritable bowel syndrome)    Left fibular fracture 10/2013   MVP (mitral valve prolapse)    Patellar dislocation 10/2013   Left   Patellar fracture    Left   Past Surgical History:  Procedure Laterality Date   BILATERAL SALPINGOOPHORECTOMY  07/10/1998   Endometrosis and LOA   BREAST BIOPSY Bilateral 3/10   BREAST BIOPSY Right 1959   BREAST BIOPSY Right 1970's & 1981   CHOLECYSTECTOMY  02/2004   COLONOSCOPY     thigh lipoma Right    removed   TOOTH EXTRACTION      UMBILICAL HERNIA REPAIR  07/10/1998   at same time of Ophorectomy   VAGINAL HYSTERECTOMY  1977   Social History   Socioeconomic History   Marital status: Divorced    Spouse name: Not on file   Number of children: 3   Years of education: Not on file   Highest education level: Not on file  Occupational History   Not on file  Tobacco Use   Smoking status: Never   Smokeless tobacco: Never  Vaping Use   Vaping Use: Never used  Substance and Sexual Activity   Alcohol use: No   Drug use: No   Sexual activity: Not Currently    Partners: Male  Other Topics Concern   Not on file  Social History Narrative   Not on file   Social Determinants of Health   Financial Resource Strain: Not on file  Food Insecurity: Not on file  Transportation Needs: Not on file  Physical Activity: Not on file  Stress: Not on file  Social Connections: Not on file   Allergies  Allergen Reactions   Levofloxacin     Muscle pain  Tendonitis    Azithromycin Diarrhea    Extreme diarrhea   Ciprofloxacin    Codeine Nausea And Vomiting   Demerol [Meperidine] Nausea And Vomiting   Provera [Medroxyprogesterone Acetate]     unknown   Sulfa Antibiotics Nausea And Vomiting   Percocet [  Oxycodone-Acetaminophen] Nausea And Vomiting   Family History  Problem Relation Age of Onset   Heart failure Mother    Ulcers Mother    Hypertension Mother    Pleurisy Father    Cancer Father    Cancer Maternal Aunt        uterine cancer   Cancer Paternal Aunt        lymphoma   Diabetes Maternal Grandmother    Diverticulitis Son    Diverticulitis Daughter      Current Outpatient Medications (Cardiovascular):    hydrochlorothiazide (HYDRODIURIL) 12.5 MG tablet,    propranolol (INDERAL) 40 MG tablet, Take 40 mg by mouth 2 (two) times daily.  Current Outpatient Medications (Respiratory):    fluticasone (FLOVENT DISKUS) 50 MCG/BLIST diskus inhaler, Inhale 1 puff into the lungs 2 (two) times daily.   montelukast  (SINGULAIR) 10 MG tablet, Take 10 mg by mouth daily.    sodium chloride (OCEAN) 0.65 % nasal spray, Place 1 spray into the nose as needed for congestion.   Current Outpatient Medications (Hematological):    Cyanocobalamin (B-12 PO), Take 1,000 mcg by mouth.   Ferrous Sulfate (IRON) 325 (65 Fe) MG TABS, Take by mouth.  Current Outpatient Medications (Other):    Cholecalciferol (VITAMIN D3) 2000 units TABS, Take by mouth daily.   Coenzyme Q10 (CO Q-10 PO), Take by mouth.   hyoscyamine (LEVBID) 0.375 MG 12 hr tablet, Take 1 tablet by mouth 2 (two) times daily.   Magnesium 250 MG TABS, Take by mouth daily.   POTASSIUM GLUCONATE PO, Take 99 mg by mouth.   Probiotic Product (PRO-BIOTIC BLEND PO), Take by mouth daily.   Quercetin 250 MG TABS, Take by mouth.   TART CHERRY PO, Take by mouth.   triamcinolone cream (KENALOG) 0.1 %, daily as needed.   TURMERIC PO, Take by mouth.   vitamin C (ASCORBIC ACID) 500 MG tablet, Take 500 mg by mouth daily.   zinc gluconate 50 MG tablet, Take 50 mg by mouth daily.   Reviewed prior external information including notes and imaging from  primary care provider As well as notes that were available from care everywhere and other healthcare systems.  Past medical history, social, surgical and family history all reviewed in electronic medical record.  No pertanent information unless stated regarding to the chief complaint.   Review of Systems:  No headache, visual changes, nausea, vomiting, diarrhea, constipation, dizziness, abdominal pain, skin rash, fevers, chills, night sweats, weight loss, swollen lymph nodes, body aches, joint swelling, chest pain, shortness of breath, mood changes. POSITIVE muscle aches and cramps  Objective  Blood pressure 110/72, pulse 67, height 5' 4.5" (1.638 m), weight 158 lb (71.7 kg), last menstrual period 12/05/1975, SpO2 96 %.   General: No apparent distress alert and oriented x3 mood and affect normal, dressed appropriately.   HEENT: Pupils equal, extraocular movements intact  Respiratory: Patient's speak in full sentences and does not appear short of breath  Cardiovascular: No lower extremity edema, non tender, no erythema  Gait relatively normal MSK: Patient's right knee still has crepitus noted.  Lateral tracking of the patella noted.  Does have narrowing of the medial joint space.  Patient is tender to palpation over the medial joint space.    Impression and Recommendations:     The above documentation has been reviewed and is accurate and complete Judi Saa, DO

## 2020-12-18 ENCOUNTER — Encounter: Payer: Self-pay | Admitting: Family Medicine

## 2020-12-18 ENCOUNTER — Ambulatory Visit (INDEPENDENT_AMBULATORY_CARE_PROVIDER_SITE_OTHER): Payer: Medicare PPO | Admitting: Family Medicine

## 2020-12-18 ENCOUNTER — Other Ambulatory Visit: Payer: Self-pay

## 2020-12-18 DIAGNOSIS — M1711 Unilateral primary osteoarthritis, right knee: Secondary | ICD-10-CM | POA: Diagnosis not present

## 2020-12-18 NOTE — Patient Instructions (Signed)
Good to see you Take vitamin C with iron containing meals Discontinue other vitamins  See me again in 4 months

## 2020-12-18 NOTE — Assessment & Plan Note (Signed)
Patient is doing remarkably well with conservative therapy.  Still have not even done any of the more aggressive therapy such as injections.  Patient is doing very well with the home exercises and icing regimen.  Patient will continue this.  Patient did discontinue the iron and has had some mild increase in cramping.  We will monitor otherwise.  We discussed potentially taking vitamin C with iron containing meals which could be helpful.  If any worsening cramping I would consider laboratory work-up.  Follow-up with me again in 4 months.

## 2021-01-14 ENCOUNTER — Ambulatory Visit: Payer: Medicare PPO | Admitting: Podiatry

## 2021-01-14 ENCOUNTER — Other Ambulatory Visit: Payer: Self-pay

## 2021-01-14 DIAGNOSIS — B351 Tinea unguium: Secondary | ICD-10-CM

## 2021-01-14 DIAGNOSIS — M79674 Pain in right toe(s): Secondary | ICD-10-CM

## 2021-01-14 DIAGNOSIS — M79675 Pain in left toe(s): Secondary | ICD-10-CM | POA: Diagnosis not present

## 2021-01-14 NOTE — Patient Instructions (Signed)
You can try theraworx for the cramping  ?

## 2021-01-14 NOTE — Progress Notes (Signed)
Subjective: 80 y.o. returns the office today for painful, elongated, thickened toenails which she cannot trim herself and for calluses.  Denies any redness or drainage or any open lesions.  Denies any systemic complaints such as fevers, chills, nausea, vomiting.   She does report that she has stopped magnesium and other medications due to IBS and since then she had some cramping into her feet.  No recent injury.  PCP: [name], MD  Objective: AAO 3, NAD DP/PT pulses palpable, CRT less than 3 seconds Nails hypertrophic, dystrophic, elongated, brittle, discolored 10. There is tenderness overlying the nails 1-5 bilaterally. There is no surrounding erythema or drainage along the nail sites. There is minimal hyperkeratotic lesion bilateral foot submetatarsal 5.  There is no underlying ulceration drainage or any signs of infection. No other areas of discomfort today particular there is no pain on the plantar fascial, Achilles tendon.  There is no area of pinpoint tenderness.  MMT 5/5. No pain with calf compression, swelling, warmth, erythema.  Assessment: Patient presents with symptomatic onychomycosis; hyperkeratotic lesions  Plan: -Treatment options including alternatives, risks, complications were discussed -Nails sharply debrided 10 without complication/bleeding.  Urea cream for toenails -Minimal hyperkeratotic lesion sharply x2 without any complications or bleeding as a courtesy -Discussed topical medication she can use to help with cramping including Therawox.  -Discussed daily foot inspection. If there are any changes, to call the office immediately.  -Follow-up in 3 months or sooner if any problems are to arise. In the meantime, encouraged to call the office with any questions, concerns, changes symptoms.  Ovid Curd, DPM

## 2021-01-16 ENCOUNTER — Encounter (HOSPITAL_BASED_OUTPATIENT_CLINIC_OR_DEPARTMENT_OTHER): Payer: Self-pay | Admitting: Obstetrics & Gynecology

## 2021-01-16 ENCOUNTER — Other Ambulatory Visit: Payer: Self-pay

## 2021-01-16 ENCOUNTER — Ambulatory Visit (HOSPITAL_BASED_OUTPATIENT_CLINIC_OR_DEPARTMENT_OTHER): Payer: Medicare PPO | Admitting: Obstetrics & Gynecology

## 2021-01-16 VITALS — BP 133/68 | HR 70 | Ht 64.5 in | Wt 156.2 lb

## 2021-01-16 DIAGNOSIS — L280 Lichen simplex chronicus: Secondary | ICD-10-CM

## 2021-01-16 DIAGNOSIS — Z01411 Encounter for gynecological examination (general) (routine) with abnormal findings: Secondary | ICD-10-CM

## 2021-01-16 DIAGNOSIS — Z90722 Acquired absence of ovaries, bilateral: Secondary | ICD-10-CM

## 2021-01-16 DIAGNOSIS — N8110 Cystocele, unspecified: Secondary | ICD-10-CM

## 2021-01-16 DIAGNOSIS — Z90710 Acquired absence of both cervix and uterus: Secondary | ICD-10-CM

## 2021-01-16 NOTE — Progress Notes (Signed)
GYNECOLOGY  VISIT  CC:   breast and pelvic exam  HPI: 80 y.o. G3P3 Divorced White or Caucasian female here for breast and pelvic exam.  I have been following her as well for pelvic prolapse issues.  She has seen Dr. Antoine Primas.  She feels so much better and describes this as "night and day" difference.  Notes reviewed from 2021 and 2022.  Reports she is having some increase stool soilage.  She is off several supplements.  Cramping has started back.    Just saw Dr. Ardelle Anton.  He recommended a magnesium cream.  She decided to not try this but did find a magnesium that is vegan that should help prevent the cramping.    Having a little vaginal discharge.  Denies vaginal bleeding.    Health Maintenance: Pap:  2002 normal  History of abnormal Pap:  no MMG:  02/2020 Colonoscopy:   09/2012 normal BMD:    10/25/15 mild osteopenia PCP:  Dr. Tenny Craw.  Last visit was in 2021.  Does lab work then.  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   Abdominal pain 12/04/2015   Heart palpitations 12/05/2014   Mitral valve prolapse 12/05/2014   Rectocele 04/25/2014   Cystocele 04/25/2014    Past Medical History:  Diagnosis Date   Diverticulosis    Fibromyalgia    GERD (gastroesophageal reflux disease)    Hyperlipidemia    Hypertension    IBS (irritable bowel syndrome)    Left fibular fracture 10/2013   MVP (mitral valve prolapse)    Patellar dislocation 10/2013   Left   Patellar fracture    Left    Past Surgical History:  Procedure Laterality Date   BILATERAL SALPINGOOPHORECTOMY  07/10/1998   Endometrosis and LOA   BREAST BIOPSY Bilateral 3/10   BREAST BIOPSY Right 1959   BREAST BIOPSY Right 1970's & 1981   CHOLECYSTECTOMY  02/2004   COLONOSCOPY     thigh lipoma Right     removed   TOOTH EXTRACTION     UMBILICAL HERNIA REPAIR  07/10/1998   at same time of Ophorectomy   VAGINAL HYSTERECTOMY  1977    MEDS:   Current Outpatient Medications on File Prior to Visit  Medication Sig Dispense Refill   Cholecalciferol (VITAMIN D3) 2000 units TABS Take by mouth daily.     Cyanocobalamin (B-12 PO) Take 1,000 mcg by mouth.     hydrochlorothiazide (HYDRODIURIL) 12.5 MG tablet      hyoscyamine (LEVBID) 0.375 MG 12 hr tablet Take 1 tablet by mouth 2 (two) times daily.     montelukast (SINGULAIR) 10 MG tablet Take 10 mg by mouth daily.      POTASSIUM GLUCONATE PO Take 99 mg by mouth.     Probiotic Product (PRO-BIOTIC BLEND PO) Take by mouth daily.     propranolol (INDERAL) 40 MG tablet Take 40 mg by mouth 2 (two) times daily.     sodium chloride (OCEAN) 0.65 % nasal spray Place 1 spray into the nose as needed for congestion.     TART CHERRY PO Take by mouth.     triamcinolone cream (KENALOG) 0.1 % daily as needed.  1   TURMERIC PO Take by mouth.     vitamin C (ASCORBIC ACID) 500 MG tablet Take 500 mg by mouth  daily.     zinc gluconate 50 MG tablet Take 50 mg by mouth daily.     Coenzyme Q10 (CO Q-10 PO) Take by mouth. (Patient not taking: Reported on 01/16/2021)     Ferrous Sulfate (IRON) 325 (65 Fe) MG TABS Take by mouth. (Patient not taking: Reported on 01/16/2021)     fluticasone (FLOVENT DISKUS) 50 MCG/BLIST diskus inhaler Inhale 1 puff into the lungs 2 (two) times daily. (Patient not taking: Reported on 01/16/2021)     Magnesium 250 MG TABS Take by mouth daily. (Patient not taking: Reported on 01/16/2021)     Quercetin 250 MG TABS Take by mouth. (Patient not taking: Reported on 01/16/2021)     No current facility-administered medications on file prior to visit.    ALLERGIES: Levofloxacin, Azithromycin, Ciprofloxacin, Codeine, Demerol [meperidine], Provera [medroxyprogesterone acetate], Sulfa antibiotics, and Percocet [oxycodone-acetaminophen]  Family History   Problem Relation Age of Onset   Heart failure Mother    Ulcers Mother    Hypertension Mother    Pleurisy Father    Cancer Father    Cancer Maternal Aunt        uterine cancer   Cancer Paternal Aunt        lymphoma   Diabetes Maternal Grandmother    Diverticulitis Son    Diverticulitis Daughter     SH:  divorced (ex-husband has passed), non smoker  Review of Systems  Constitutional: Negative.   Gastrointestinal:  Negative for abdominal distention and abdominal pain.  Genitourinary: Negative.    PHYSICAL EXAMINATION:    BP 133/68 (BP Location: Right Arm, Patient Position: Sitting, Cuff Size: Small)   Pulse 70   Ht 5' 4.5" (1.638 m)   Wt 156 lb 3.2 oz (70.9 kg)   LMP 12/05/1975   BMI 26.40 kg/m     General appearance: alert, cooperative and appears stated age Breasts: normal appearance, no masses or tenderness Abdomen: soft, non-tender; bowel sounds normal; no masses,  no organomegaly Lymph:  no inguinal LAD noted  Pelvic: External genitalia:  no lesions              Urethra:  normal appearing urethra with no masses, tenderness or lesions              Bartholins and Skenes: normal                 Vagina: normal appearing vagina with normal color and discharge, no lesions, 3rd degree cystocele              Cervix: absent              Bimanual Exam:  Uterus:  uterus absent              Adnexa: no mass, fullness, tenderness              Rectovaginal: Yes.  .  Confirms.              Anus:  normal sphincter tone, no lesions  Chaperone, Ina Homes, CMA, was present for exam.  Assessment/Plan: 1. Encntr for gyn exam (general) (routine) w abnormal findings - pap smear not indicated - MMG 2021 - colonoscopy 09/2012 - BMD 2017.  Will be ordered with faxed order to [facility]  2. History of total vaginal hysterectomy (TVH)  3. Status post bilateral salpingo-oophorectomy (BSO) - in 200 with LOA and umbilical hernia repair  4. 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

## 2021-01-29 ENCOUNTER — Encounter (HOSPITAL_BASED_OUTPATIENT_CLINIC_OR_DEPARTMENT_OTHER): Payer: Self-pay | Admitting: Obstetrics & Gynecology

## 2021-01-29 ENCOUNTER — Other Ambulatory Visit (HOSPITAL_BASED_OUTPATIENT_CLINIC_OR_DEPARTMENT_OTHER): Payer: Self-pay

## 2021-01-29 DIAGNOSIS — Z780 Asymptomatic menopausal state: Secondary | ICD-10-CM

## 2021-01-29 DIAGNOSIS — M8580 Other specified disorders of bone density and structure, unspecified site: Secondary | ICD-10-CM

## 2021-01-29 NOTE — Progress Notes (Addendum)
Pt to have Bone density at [facility] Per Dr Hyacinth Meeker, order faxed.  Patient was called and notified that orders have been placed and to call and get the appointment scheduled. TW

## 2021-01-31 ENCOUNTER — Ambulatory Visit (HOSPITAL_BASED_OUTPATIENT_CLINIC_OR_DEPARTMENT_OTHER): Payer: Medicare PPO | Admitting: Obstetrics & Gynecology

## 2021-01-31 ENCOUNTER — Encounter (HOSPITAL_BASED_OUTPATIENT_CLINIC_OR_DEPARTMENT_OTHER): Payer: Self-pay | Admitting: Obstetrics & Gynecology

## 2021-01-31 ENCOUNTER — Other Ambulatory Visit: Payer: Self-pay

## 2021-01-31 VITALS — BP 145/69 | HR 66 | Ht 64.5 in | Wt 155.2 lb

## 2021-01-31 DIAGNOSIS — Z9189 Other specified personal risk factors, not elsewhere classified: Secondary | ICD-10-CM | POA: Diagnosis not present

## 2021-01-31 DIAGNOSIS — M8580 Other specified disorders of bone density and structure, unspecified site: Secondary | ICD-10-CM | POA: Diagnosis not present

## 2021-01-31 DIAGNOSIS — M85851 Other specified disorders of bone density and structure, right thigh: Secondary | ICD-10-CM

## 2021-01-31 DIAGNOSIS — E781 Pure hyperglyceridemia: Secondary | ICD-10-CM | POA: Diagnosis not present

## 2021-01-31 DIAGNOSIS — I10 Essential (primary) hypertension: Secondary | ICD-10-CM | POA: Diagnosis not present

## 2021-01-31 DIAGNOSIS — K219 Gastro-esophageal reflux disease without esophagitis: Secondary | ICD-10-CM | POA: Diagnosis not present

## 2021-01-31 MED ORDER — ALENDRONATE SODIUM 70 MG PO TABS: 70.0000 mg | ORAL_TABLET | ORAL | 12 refills | Status: DC

## 2021-01-31 NOTE — Progress Notes (Signed)
GYNECOLOGY  VISIT  CC:   discuss BMD  HPI: 80 y.o. G3P3 Divorced White or Caucasian female here for discussino of bone density obtained in April.  When pt was here for recent exam, we discussed testing a bone density so this was ordered.  She actually had one ordered by her PCP in April.  We called her with results which she thinks she had not received.  T score in R femur was -1.6.  There was no comparison from the prior BMD but comparison of other locations does show a little change.  FRAX score was calculated and was 14% for major fracture and 3.5% in femur.  Treatment options discussed.  Decided to proceed with fosamax.  Reflux risk discussed.  Would plan repeat BMD in 2 years.  Supplements reviewed.  On Vit D.  Calculated creatinine clearance today was >60  GYNECOLOGIC HISTORY: Patient's last menstrual period was 12/05/1975.  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   Abdominal pain 12/04/2015   Heart palpitations 12/05/2014   Mitral valve prolapse 12/05/2014   Rectocele 04/25/2014   Cystocele 04/25/2014    Past Medical History:  Diagnosis Date   Diverticulosis    Fibromyalgia    GERD (gastroesophageal reflux disease)    Hyperlipidemia    Hypertension    IBS (irritable bowel syndrome)    Left fibular fracture 10/2013   MVP (mitral valve prolapse)    Patellar dislocation 10/2013   Left   Patellar fracture    Left    Past Surgical History:  Procedure Laterality Date   BILATERAL SALPINGOOPHORECTOMY  07/10/1998   Endometrosis and LOA   BREAST BIOPSY Bilateral 3/10   BREAST BIOPSY Right 1959   BREAST BIOPSY Right 1970's & 1981   CHOLECYSTECTOMY  02/2004   COLONOSCOPY     thigh lipoma Right    removed   TOOTH EXTRACTION     UMBILICAL HERNIA REPAIR   07/10/1998   at same time of Ophorectomy   VAGINAL HYSTERECTOMY  1977    MEDS:   Current Outpatient Medications on File Prior to Visit  Medication Sig Dispense Refill   Cholecalciferol (VITAMIN D3) 2000 units TABS Take by mouth daily.     Coenzyme Q10 (CO Q-10 PO) Take by mouth. (Patient not taking: Reported on 01/16/2021)     Cyanocobalamin (B-12 PO) Take 1,000 mcg by mouth.     Ferrous Sulfate (IRON) 325 (65 Fe) MG TABS Take by mouth. (Patient not taking: Reported on 01/16/2021)     fluticasone (FLOVENT DISKUS) 50 MCG/BLIST diskus inhaler Inhale 1 puff into the lungs 2 (two) times daily. (Patient not taking: Reported on 01/16/2021)     hydrochlorothiazide (HYDRODIURIL) 12.5 MG tablet      hyoscyamine (LEVBID) 0.375 MG 12 hr tablet Take 1 tablet by mouth 2 (two) times daily.     Magnesium 250 MG TABS Take by mouth daily. (Patient not taking: Reported on 01/16/2021)     montelukast (SINGULAIR) 10 MG tablet Take 10 mg by mouth daily.      POTASSIUM GLUCONATE PO Take 99 mg by mouth.     Probiotic Product (PRO-BIOTIC BLEND PO) Take by mouth daily.     propranolol (INDERAL) 40 MG tablet Take 40 mg by mouth 2 (two) times daily.     Quercetin 250  MG TABS Take by mouth. (Patient not taking: Reported on 01/16/2021)     sodium chloride (OCEAN) 0.65 % nasal spray Place 1 spray into the nose as needed for congestion.     TART CHERRY PO Take by mouth.     triamcinolone cream (KENALOG) 0.1 % daily as needed.  1   TURMERIC PO Take by mouth.     vitamin C (ASCORBIC ACID) 500 MG tablet Take 500 mg by mouth daily.     zinc gluconate 50 MG tablet Take 50 mg by mouth daily.     No current facility-administered medications on file prior to visit.    ALLERGIES: Levofloxacin, Azithromycin, Ciprofloxacin, Codeine, Demerol [meperidine], Provera [medroxyprogesterone acetate], Sulfa antibiotics, and Percocet [oxycodone-acetaminophen]  Family History  Problem Relation Age of Onset   Heart failure Mother    Ulcers  Mother    Hypertension Mother    Pleurisy Father    Cancer Father    Cancer Maternal Aunt        uterine cancer   Cancer Paternal Aunt        lymphoma   Diabetes Maternal Grandmother    Diverticulitis Son    Diverticulitis Daughter     SH:  widowed, non smoker  Review of Systems  Constitutional: Negative.    PHYSICAL EXAMINATION:    BP (!) 145/69 (BP Location: Right Arm, Patient Position: Sitting, Cuff Size: Large)   Pulse 66   Ht 5' 4.5" (1.638 m)   Wt 155 lb 3.2 oz (70.4 kg)   LMP 12/05/1975   BMI 26.23 kg/m     General appearance: alert, cooperative and appears stated age  Assessment/Plan: 1. At high risk for fracture - alendronate (FOSAMAX) 70 MG tablet; Take 1 tablet (70 mg total) by mouth every 7 (seven) days. Take first thing in am with 6 oz. Water.  Be upright after taking.  Eat nothing for one hour.  Dispense: 4 tablet; Refill: 12 - Plan repeat BMD in 2 years  2. Osteopenia of right hip

## 2021-02-13 ENCOUNTER — Encounter (HOSPITAL_BASED_OUTPATIENT_CLINIC_OR_DEPARTMENT_OTHER): Payer: Self-pay

## 2021-02-24 ENCOUNTER — Other Ambulatory Visit: Payer: Self-pay

## 2021-02-24 ENCOUNTER — Telehealth (INDEPENDENT_AMBULATORY_CARE_PROVIDER_SITE_OTHER): Payer: Medicare PPO | Admitting: Obstetrics & Gynecology

## 2021-02-24 ENCOUNTER — Encounter (HOSPITAL_BASED_OUTPATIENT_CLINIC_OR_DEPARTMENT_OTHER): Payer: Self-pay | Admitting: Obstetrics & Gynecology

## 2021-02-24 DIAGNOSIS — M85851 Other specified disorders of bone density and structure, right thigh: Secondary | ICD-10-CM | POA: Diagnosis not present

## 2021-02-24 DIAGNOSIS — M85852 Other specified disorders of bone density and structure, left thigh: Secondary | ICD-10-CM | POA: Diagnosis not present

## 2021-02-24 NOTE — Progress Notes (Signed)
Virtual Visit via Video Note  I connected with Veronica Franklin on 02/24/21 at 11:15 AM EDT by a video enabled telemedicine application and verified that I am speaking with the correct person using two identifiers.  Location: Patient: at home Provider: office   I discussed the limitations of evaluation and management by telemedicine and the availability of in person appointments. The patient expressed understanding and agreed to proceed.  History of Present Illness: 80 yo G3P3 DWF with virtual visit for discussion of medication treatment for osteopenia and increased fracture risk noted on DEXA scan 10/24/2020.  This BMD showed T score in right femur was -1.6.  FRAX score was 14% for major fracture and 3.5% risk in hip.  We discussed monitoring conservatively vs treatment.  Medications discussed.  She did medication filled and she has thoroughly read the side effects.  She is concerned about osteonecrosis, GERD, and bone pain.  When she was at the office, we discussed the most common ones and the most worrisome ones in the office but now she is not sure this is not something she wants to take.  Discussed Prolia today as this is the medication she is most interested in taking and we reviewed the side effects today.  She is aware osteonecrosis is a risk as well as bone pain.     Observations/Objective: [name] WF NAD  Assessment and Plan: 80 yo G3P3 DWF with osteopenia but high risk FRAX score for hip fracture (3.5%)  1) After discussion, decided to not start any medication 2) Will plan to repeat BMD in 2 years.  Follow Up Instructions: I discussed the assessment and treatment plan with the patient. The patient was provided an opportunity to ask questions and all were answered. The patient agreed with the plan and demonstrated an understanding of the instructions.   The patient was advised to call back or seek an in-person evaluation if the symptoms worsen or if the condition fails to improve as  anticipated.  I provided 20 minutes of non-face-to-face time during this encounter.   Jerene Bears, MD

## 2021-02-25 DIAGNOSIS — Z1231 Encounter for screening mammogram for malignant neoplasm of breast: Secondary | ICD-10-CM | POA: Diagnosis not present

## 2021-03-04 ENCOUNTER — Encounter (HOSPITAL_BASED_OUTPATIENT_CLINIC_OR_DEPARTMENT_OTHER): Payer: Self-pay | Admitting: Obstetrics & Gynecology

## 2021-03-09 IMAGING — CT CT CHEST W/O CM
1 series · 15 of 34 positions shown, 19 images · non-contrast
Comparison: None.

CLINICAL DATA: Lung nodule.

EXAM:
CT CHEST WITHOUT CONTRAST
TECHNIQUE: Multidetector CT imaging of the chest was performed following the
standard protocol without IV contrast.

[Series 2: chest w/(date) · axial · 0.69mm/px · z∈[-285,-47]mm · 15 of 141 slices shown, 19 images]
[im 11/141  mediastinal]
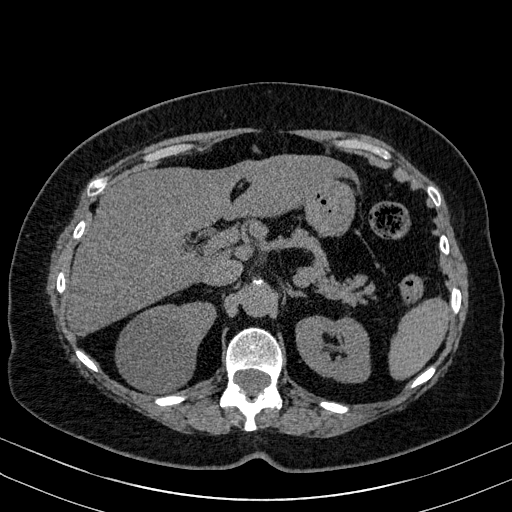
[im 11/141  lung]
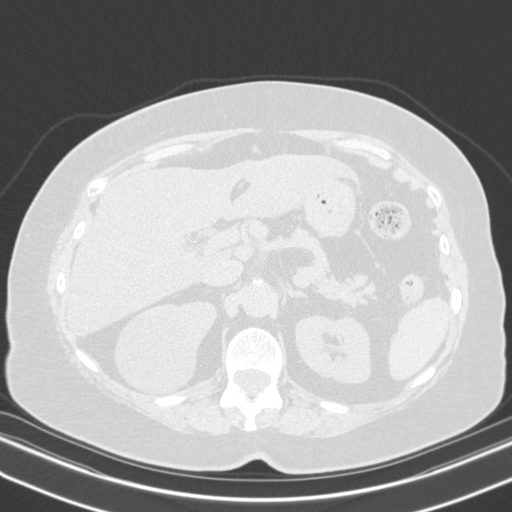
[im 21/141  lung]
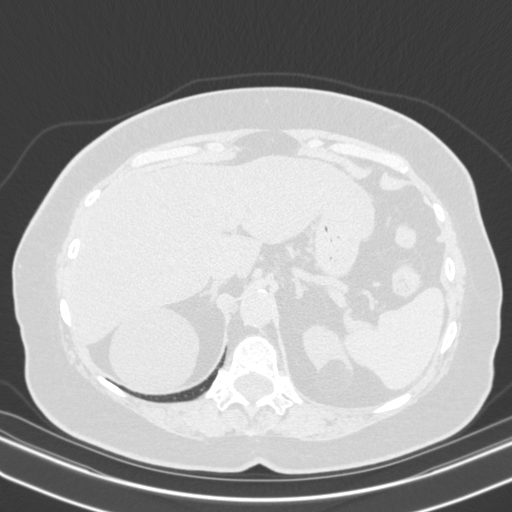
[im 29/141  lung]
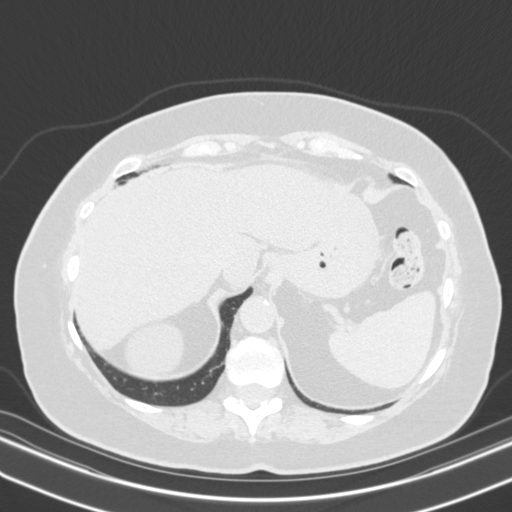
[im 37/141  lung]
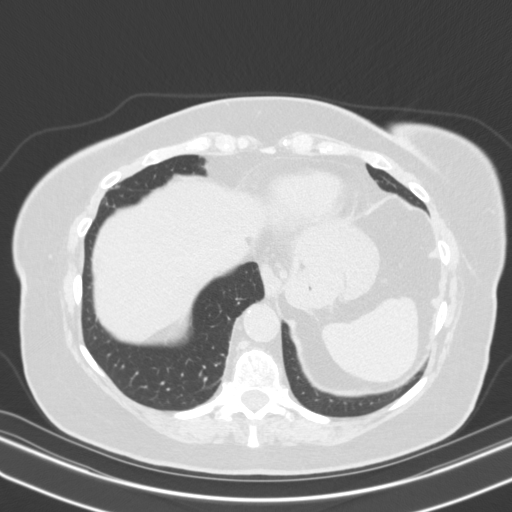
[im 47/141  mediastinal]
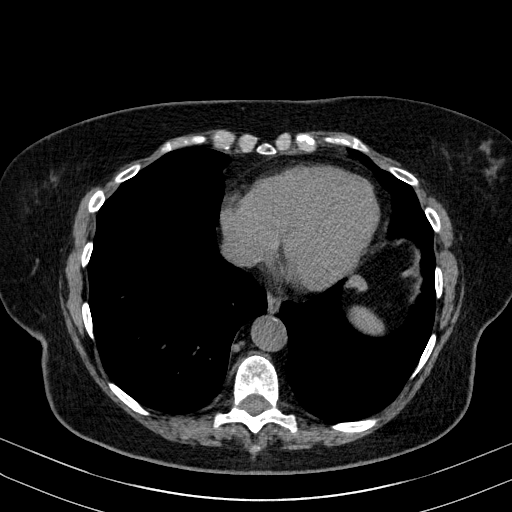
[im 47/141  lung]
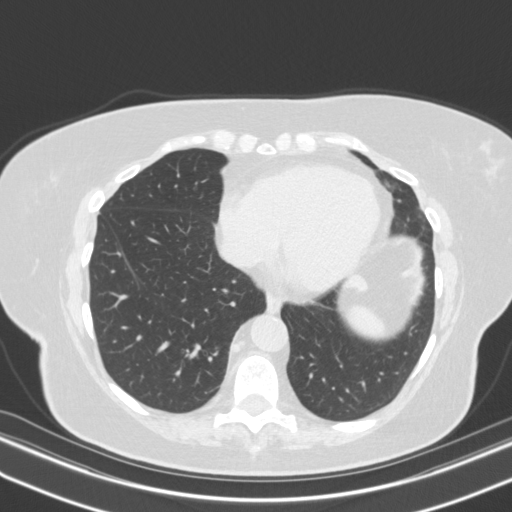
[im 57/141  lung]
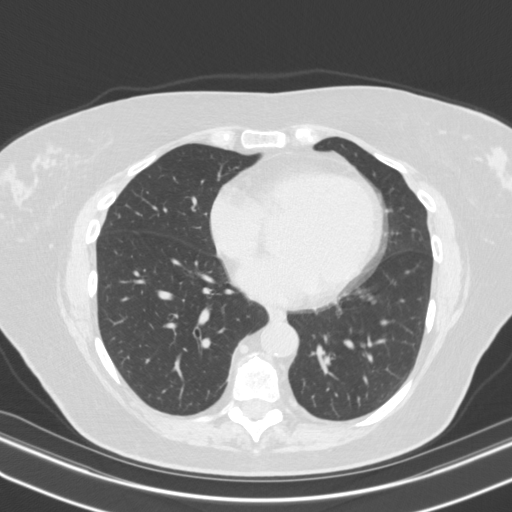
[im 63/141  lung]
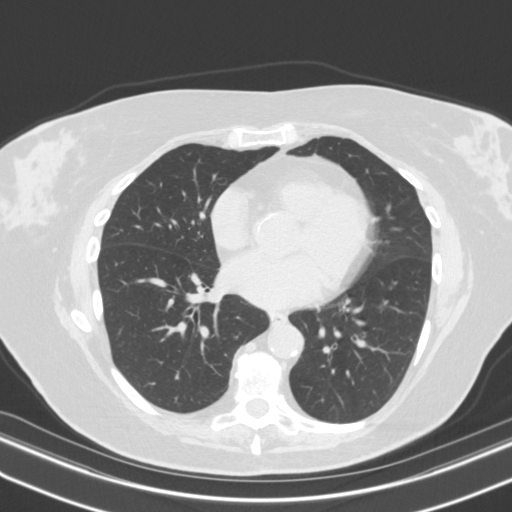
[im 73/141  lung]
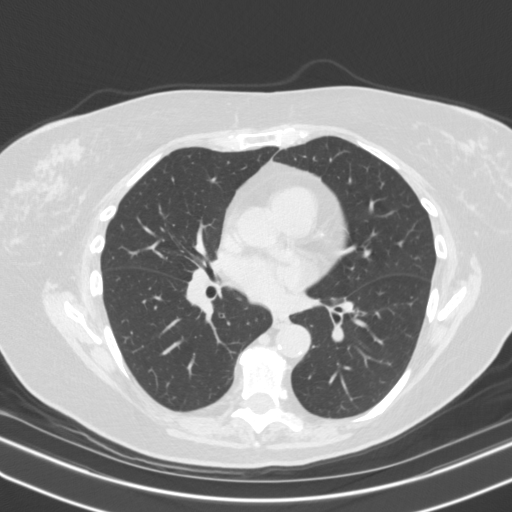
[im 78/141  mediastinal]
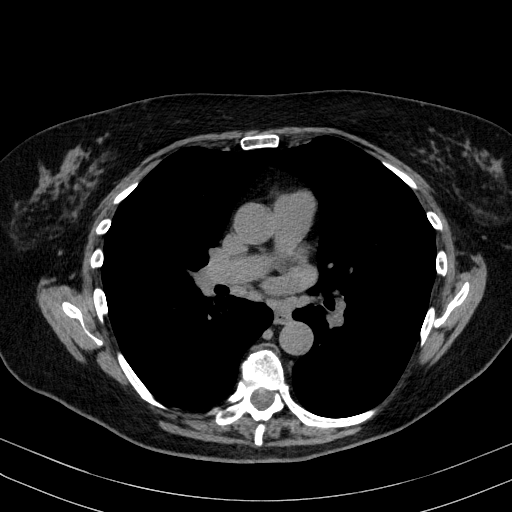
[im 78/141  lung]
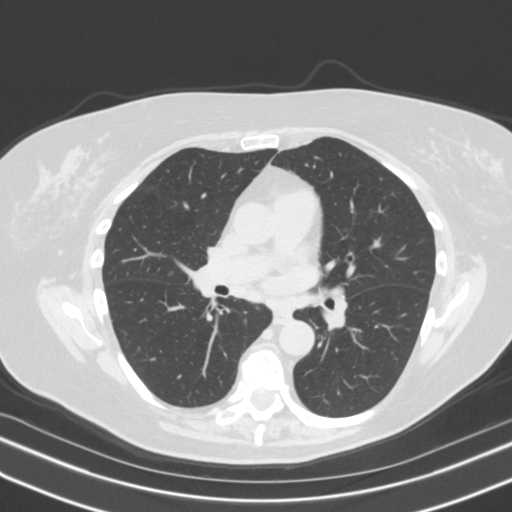
[im 85/141  lung]
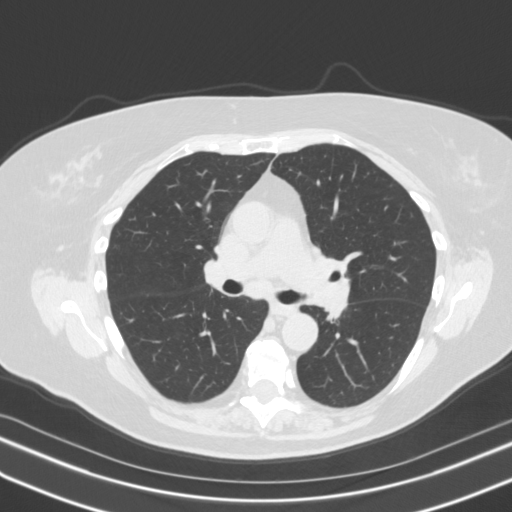
[im 94/141  lung]
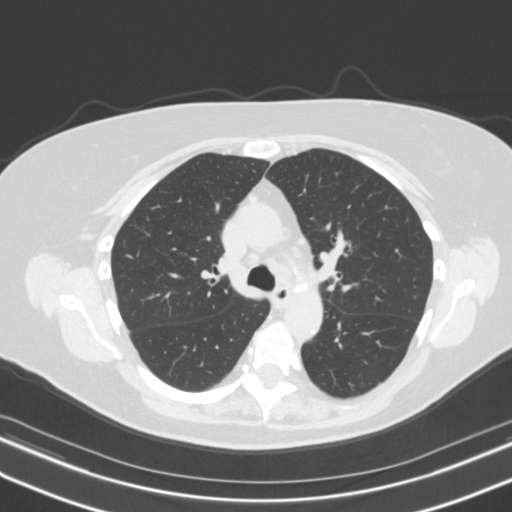
[im 104/141  lung]
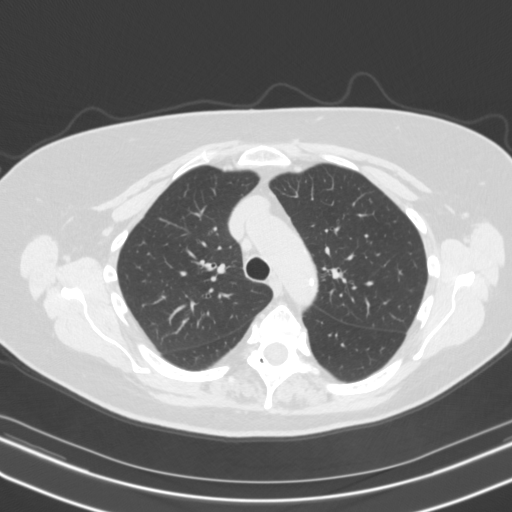
[im 113/141  mediastinal]
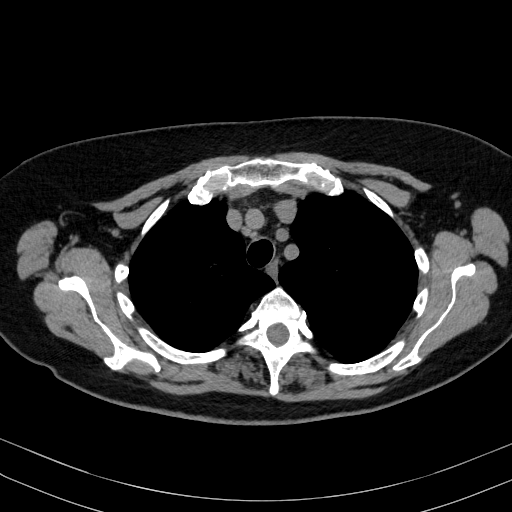
[im 113/141  lung]
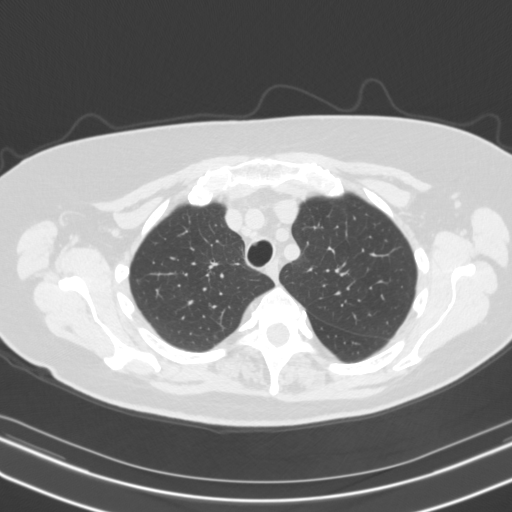
[im 120/141  lung]
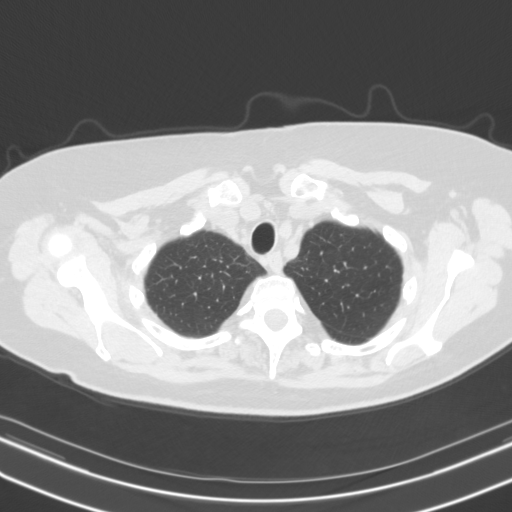
[im 130/141  lung]
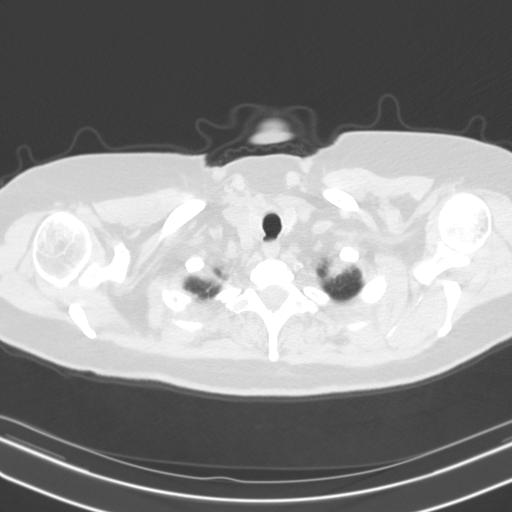

[15 of 34 positions shown; findings below may reference images not displayed]

FINDINGS: Cardiovascular: Thoracic aortic atherosclerosis without aneurysm.
LAD and left circumflex coronary artery calcification. Normal heart
size. No pericardial effusion.

Mediastinum/Nodes: Mildly prominent number of normal sized
mediastinal lymph nodes. No enlarged axillary or hilar lymph nodes
identified with hilar assessment limited in the absence of IV
contrast. Small calcified left hilar lymph nodes. Unremarkable
thyroid and esophagus.

Lungs/Pleura: No pleural effusion or pneumothorax. Small lung
nodules measuring 3 mm in the right upper lobe (series 3, image 24),
2 mm in the right upper lobe (series 3, image 28), 5 mm in the right
upper lobe (series 3, image 32), 3 mm in the right middle lobe
(series 3, image 63), and 5 mm in the right lower lobe (series 3,
image 78). No consolidation.

Upper Abdomen: Status post cholecystectomy. 6.1 cm right upper pole
renal cyst. 2.5 cm left upper pole renal angiomyolipoma. Aortic
atherosclerosis.

Musculoskeletal: No acute osseous abnormality or suspicious osseous
lesion. Mild upper thoracic levoscoliosis and moderate multilevel
thoracic disc degeneration.
IMPRESSION: 1. Small lung nodules measuring up to 5 mm in size. No follow-up
needed if patient is low-risk (and has no known or suspected primary
neoplasm). Non-contrast chest CT can be considered in 12 months if
patient is high-risk. This recommendation follows the consensus
statement: Guidelines for Management of Incidental Pulmonary Nodules
Detected on CT Images: From the [HOSPITAL] 8197; Radiology
2. Aortic Atherosclerosis ([id number].O).

## 2021-03-18 ENCOUNTER — Ambulatory Visit: Payer: Medicare PPO | Admitting: Podiatry

## 2021-03-24 ENCOUNTER — Ambulatory Visit (INDEPENDENT_AMBULATORY_CARE_PROVIDER_SITE_OTHER): Payer: Medicare PPO | Admitting: Podiatry

## 2021-03-24 ENCOUNTER — Other Ambulatory Visit: Payer: Self-pay

## 2021-03-24 ENCOUNTER — Encounter: Payer: Self-pay | Admitting: Podiatry

## 2021-03-24 ENCOUNTER — Ambulatory Visit: Payer: Medicare PPO | Admitting: Podiatry

## 2021-03-24 DIAGNOSIS — M79675 Pain in left toe(s): Secondary | ICD-10-CM | POA: Diagnosis not present

## 2021-03-24 DIAGNOSIS — B351 Tinea unguium: Secondary | ICD-10-CM

## 2021-03-24 DIAGNOSIS — M79674 Pain in right toe(s): Secondary | ICD-10-CM | POA: Diagnosis not present

## 2021-03-24 NOTE — Progress Notes (Addendum)
This patient returns to the office for evaluation and treatment of long thick painful nails .  This patient is unable to trim her own nails since the patient cannot reach her feet.  Patient says the nails are painful walking and wearing his shoes. She also has painful callus on the outside of both forefeet. He returns for preventive foot care services.  General Appearance  Alert, conversant and in no acute stress.  Vascular  Dorsalis pedis and posterior tibial  pulses are palpable  bilaterally.  Capillary return is within normal limits  bilaterally. Temperature is within normal limits  bilaterally.  Neurologic  Senn-Weinstein monofilament wire test within normal limits  bilaterally. Muscle power within normal limits bilaterally.  Nails Thick disfigured discolored nails with subungual debris  from hallux to fifth toes bilaterally. No evidence of bacterial infection or drainage bilaterally.  Orthopedic  No limitations of motion  feet .  No crepitus or effusions noted.  No bony pathology or digital deformities noted.  Skin  normotropic skin with no porokeratosis noted bilaterally.  No signs of infections or ulcers noted.     Onychomycosis  Pain in toes right foot  Pain in toes left foot    Debridement  of nails  1-5  B/L with a nail nipper.  Nails were then filed using a dremel tool with no incidents.     RTC see Dr.  Ardelle Anton.   Helane Gunther DPM

## 2021-03-24 NOTE — Progress Notes (Signed)
See other progress note.  Helane Gunther DPM

## 2021-04-07 ENCOUNTER — Ambulatory Visit: Payer: Medicare PPO | Admitting: Podiatry

## 2021-04-22 NOTE — Progress Notes (Signed)
[facility] [address] [place] [address] Phone: [phone] Subjective:   [facility], am serving as a scribe for Dr. Antoine Primas. This visit occurred during the SARS-CoV-2 public health emergency.  Safety protocols were in place, including screening questions prior to the visit, additional usage of staff PPE, and extensive cleaning of exam room while observing appropriate contact time as indicated for disinfecting solutions.   I'm seeing this patient by the request  of:  [name], MD  CC: Knee pain and leg pain follow-up  CZY:[id number]  12/18/2020 Patient is doing remarkably well with conservative therapy.  Still have not even done any of the more aggressive therapy such as injections.  Patient is doing very well with the home exercises and icing regimen.  Patient will continue this.  Patient did discontinue the iron and has had some mild increase in cramping.  We will monitor otherwise.  We discussed potentially taking vitamin C with iron containing meals which could be helpful.  If any worsening cramping I would consider laboratory work-up.  Follow-up with me again in 4 months.  Update 04/23/2021 [facility] is a 80 y.o. female coming in with complaint of R knee pain. Patient states doing much better. Only twice can she recount having pain as bad as it was in May of 2021. Since then has been doing very good.  Nothing that has been giving significant difficulty.     Past Medical History:  Diagnosis Date   Diverticulosis    Fibromyalgia    GERD (gastroesophageal reflux disease)    Hyperlipidemia    Hypertension    IBS (irritable bowel syndrome)    Left fibular fracture 10/2013   MVP (mitral valve prolapse)    Patellar dislocation 10/2013   Left   Patellar fracture    Left   Past Surgical History:  Procedure Laterality Date   BILATERAL SALPINGOOPHORECTOMY  07/10/1998   Endometrosis and LOA   BREAST BIOPSY Bilateral 3/10    BREAST BIOPSY Right 1959   BREAST BIOPSY Right 1970's & 1981   CHOLECYSTECTOMY  02/2004   COLONOSCOPY     thigh lipoma Right    removed   TOOTH EXTRACTION     UMBILICAL HERNIA REPAIR  07/10/1998   at same time of Ophorectomy   VAGINAL HYSTERECTOMY  1977   Social History   Socioeconomic History   Marital status: Divorced    Spouse name: Not on file   Number of children: 3   Years of education: Not on file   Highest education level: Not on file  Occupational History   Not on file  Tobacco Use   Smoking status: Never   Smokeless tobacco: Never  Vaping Use   Vaping Use: Never used  Substance and Sexual Activity   Alcohol use: No   Drug use: No   Sexual activity: Not Currently    Partners: Male  Other Topics Concern   Not on file  Social History Narrative   Not on file   Social Determinants of Health   Financial Resource Strain: Not on file  Food Insecurity: Not on file  Transportation Needs: Not on file  Physical Activity: Not on file  Stress: Not on file  Social Connections: Not on file   Allergies  Allergen Reactions   Levofloxacin     Muscle pain  Tendonitis    Azithromycin Diarrhea    Extreme diarrhea   Ciprofloxacin    Codeine Nausea And  Vomiting   Demerol [Meperidine] Nausea And Vomiting   Provera [Medroxyprogesterone Acetate]     unknown   Sulfa Antibiotics Nausea And Vomiting   Percocet [Oxycodone-Acetaminophen] Nausea And Vomiting   Family History  Problem Relation Age of Onset   Heart failure Mother    Ulcers Mother    Hypertension Mother    Pleurisy Father    Cancer Father    Cancer Maternal Aunt        uterine cancer   Cancer Paternal Aunt        lymphoma   Diabetes Maternal Grandmother    Diverticulitis Son    Diverticulitis Daughter     Current Outpatient Medications (Endocrine & Metabolic):    alendronate (FOSAMAX) 70 MG tablet, Take 1 tablet (70 mg total) by mouth every 7 (seven) days. Take first thing in am with 6 oz. Water.   Be upright after taking.  Eat nothing for one hour.  Current Outpatient Medications (Cardiovascular):    hydrochlorothiazide (HYDRODIURIL) 12.5 MG tablet,    propranolol (INDERAL) 40 MG tablet, Take 40 mg by mouth 2 (two) times daily.  Current Outpatient Medications (Respiratory):    fluticasone (FLOVENT DISKUS) 50 MCG/BLIST diskus inhaler, Inhale 1 puff into the lungs 2 (two) times daily. (Patient not taking: Reported on 01/16/2021)   montelukast (SINGULAIR) 10 MG tablet, Take 10 mg by mouth daily.    sodium chloride (OCEAN) 0.65 % nasal spray, Place 1 spray into the nose as needed for congestion.   Current Outpatient Medications (Hematological):    Cyanocobalamin (B-12 PO), Take 1,000 mcg by mouth.  Current Outpatient Medications (Other):    Cholecalciferol (VITAMIN D3) 2000 units TABS, Take by mouth daily.   hyoscyamine (LEVBID) 0.375 MG 12 hr tablet, Take 1 tablet by mouth 2 (two) times daily.   POTASSIUM GLUCONATE PO, Take 99 mg by mouth.   Probiotic Product (PRO-BIOTIC BLEND PO), Take by mouth daily.   TART CHERRY PO, Take by mouth.   triamcinolone cream (KENALOG) 0.1 %, daily as needed.   TURMERIC PO, Take by mouth.   vitamin C (ASCORBIC ACID) 500 MG tablet, Take 500 mg by mouth daily.   zinc gluconate 50 MG tablet, Take 50 mg by mouth daily.    Objective  Blood pressure 122/78, pulse 66, height 5\' 4"  (1.626 m), weight 153 lb (69.4 kg), last menstrual period 12/05/1975, SpO2 97 %.   General: No apparent distress alert and oriented x3 mood and affect normal, dressed appropriately.  HEENT: Pupils equal, extraocular movements intact  Respiratory: Patient's speak in full sentences and does not appear short of breath  Cardiovascular: No lower extremity edema, non tender, no erythema  Gait normal with good balance and coordination.  MSK: Knee exam has some mild crepitus noted.  Nothing severe at this moment.  Patient has mild instability with valgus and varus force but patient  states no pain on exam.   Impression and Recommendations:     The above documentation has been reviewed and is accurate and complete 02/04/1976, DO

## 2021-04-23 ENCOUNTER — Other Ambulatory Visit: Payer: Self-pay

## 2021-04-23 ENCOUNTER — Ambulatory Visit: Payer: Medicare PPO | Admitting: Family Medicine

## 2021-04-23 ENCOUNTER — Encounter: Payer: Self-pay | Admitting: Family Medicine

## 2021-04-23 DIAGNOSIS — M1711 Unilateral primary osteoarthritis, right knee: Secondary | ICD-10-CM | POA: Diagnosis not present

## 2021-04-23 NOTE — Assessment & Plan Note (Signed)
Doing well since injection discussed HEP  Discussed which activities to do and which ones to avoid.  Increase activity as tolerated  See me again in 4 to 6 months

## 2021-04-23 NOTE — Patient Instructions (Signed)
Glad knee is better 500mg  Vit C with iron containing meals If nighttime cramping gets worse consider starting iron and magnesium 2x a week Arnica lotion  and heat and massage See me in 4-6 months

## 2021-05-19 DIAGNOSIS — L821 Other seborrheic keratosis: Secondary | ICD-10-CM | POA: Diagnosis not present

## 2021-05-19 DIAGNOSIS — D225 Melanocytic nevi of trunk: Secondary | ICD-10-CM | POA: Diagnosis not present

## 2021-05-19 DIAGNOSIS — L720 Epidermal cyst: Secondary | ICD-10-CM | POA: Diagnosis not present

## 2021-05-19 DIAGNOSIS — L814 Other melanin hyperpigmentation: Secondary | ICD-10-CM | POA: Diagnosis not present

## 2021-05-19 DIAGNOSIS — D1801 Hemangioma of skin and subcutaneous tissue: Secondary | ICD-10-CM | POA: Diagnosis not present

## 2021-05-19 DIAGNOSIS — Z85828 Personal history of other malignant neoplasm of skin: Secondary | ICD-10-CM | POA: Diagnosis not present

## 2021-05-22 DIAGNOSIS — R351 Nocturia: Secondary | ICD-10-CM | POA: Diagnosis not present

## 2021-05-22 DIAGNOSIS — D49512 Neoplasm of unspecified behavior of left kidney: Secondary | ICD-10-CM | POA: Diagnosis not present

## 2021-06-09 DIAGNOSIS — H16223 Keratoconjunctivitis sicca, not specified as Sjogren's, bilateral: Secondary | ICD-10-CM | POA: Diagnosis not present

## 2021-06-12 ENCOUNTER — Ambulatory Visit: Payer: Medicare PPO | Admitting: Podiatry

## 2021-06-13 ENCOUNTER — Other Ambulatory Visit: Payer: Self-pay

## 2021-06-13 ENCOUNTER — Ambulatory Visit: Payer: Medicare PPO | Admitting: Podiatry

## 2021-06-13 DIAGNOSIS — M79674 Pain in right toe(s): Secondary | ICD-10-CM

## 2021-06-13 DIAGNOSIS — B351 Tinea unguium: Secondary | ICD-10-CM

## 2021-06-13 DIAGNOSIS — M79675 Pain in left toe(s): Secondary | ICD-10-CM | POA: Diagnosis not present

## 2021-06-13 DIAGNOSIS — Q828 Other specified congenital malformations of skin: Secondary | ICD-10-CM

## 2021-06-16 NOTE — Progress Notes (Signed)
Subjective: 80 y.o. returns the office today for painful, elongated, thickened toenails which she cannot trim herself and for calluses.  Denies any redness or drainage or any open lesions.  She has no new concerns today.  No fevers or chills.  PCP: [name], MD  Objective: AAO 3, NAD DP/PT pulses palpable, CRT less than 3 seconds Nails hypertrophic, dystrophic, elongated, brittle, discolored 10. There is tenderness overlying the nails 1-5 bilaterally. There is no surrounding erythema or drainage along the nail sites. There is mild hyperkeratotic lesion bilateral foot submetatarsal 5.  There is no underlying ulceration drainage or any signs of infection. No other areas of discomfort today particular there is no pain on the plantar fascial, Achilles tendon.  There is no area of pinpoint tenderness.  MMT 5/5. No pain with calf compression, swelling, warmth, erythema.  Assessment: Patient presents with symptomatic onychomycosis; hyperkeratotic lesions  Plan: -Treatment options including alternatives, risks, complications were discussed -Nails sharply debrided 10 without complication/bleeding.  Urea cream for toenails -Minimal hyperkeratotic lesion sharply x2 without any complications or bleeding as a courtesy, there is minimal tissue.  Recommend moisturizer and offloading. -Discussed daily foot inspection. If there are any changes, to call the office immediately.  -Follow-up in 3 months or sooner if any problems are to arise. In the meantime, encouraged to call the office with any questions, concerns, changes symptoms.  Ovid Curd, DPM

## 2021-07-01 DIAGNOSIS — J069 Acute upper respiratory infection, unspecified: Secondary | ICD-10-CM | POA: Diagnosis not present

## 2021-08-20 ENCOUNTER — Encounter (HOSPITAL_BASED_OUTPATIENT_CLINIC_OR_DEPARTMENT_OTHER): Payer: Self-pay | Admitting: Obstetrics & Gynecology

## 2021-08-20 ENCOUNTER — Other Ambulatory Visit: Payer: Self-pay

## 2021-08-20 ENCOUNTER — Ambulatory Visit (HOSPITAL_BASED_OUTPATIENT_CLINIC_OR_DEPARTMENT_OTHER): Payer: Medicare PPO | Admitting: Obstetrics & Gynecology

## 2021-08-20 VITALS — BP 148/87 | HR 70 | Ht 64.75 in | Wt 153.0 lb

## 2021-08-20 DIAGNOSIS — N649 Disorder of breast, unspecified: Secondary | ICD-10-CM | POA: Diagnosis not present

## 2021-08-20 NOTE — Progress Notes (Signed)
GYNECOLOGY  VISIT  CC:   breast lesion  HPI: 81 y.o. G3P3 Divorced White or Caucasian female here for breast lesion that she's noticed over the past few weeks.  She noticed the area because it was itchy.  The itchiness has gone away without using any topical products.  She has not noticed any drainage.    Last MMG was 02/2021 and was normal.     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   Heart palpitations 12/05/2014   Mitral valve prolapse 12/05/2014   Rectocele 04/25/2014   Cystocele 04/25/2014    Past Medical History:  Diagnosis Date   Diverticulosis    Fibromyalgia    GERD (gastroesophageal reflux disease)    Hyperlipidemia    Hypertension    IBS (irritable bowel syndrome)    Left fibular fracture 10/2013   MVP (mitral valve prolapse)    Patellar dislocation 10/2013   Left   Patellar fracture    Left    Past Surgical History:  Procedure Laterality Date   BILATERAL SALPINGOOPHORECTOMY  07/10/1998   Endometrosis and LOA   BREAST BIOPSY Bilateral 3/10   BREAST BIOPSY Right 1959   BREAST BIOPSY Right 1970's & 1981   CHOLECYSTECTOMY  02/2004   COLONOSCOPY     thigh lipoma Right    removed   TOOTH EXTRACTION     UMBILICAL HERNIA REPAIR  07/10/1998   at same time of Ophorectomy   VAGINAL HYSTERECTOMY  1977    MEDS:   Current Outpatient Medications on File Prior to Visit  Medication Sig Dispense Refill   Cholecalciferol (VITAMIN D3) 2000 units TABS Take by mouth daily.     Cyanocobalamin (B-12 PO) Take 1,000 mcg by mouth.     hydrochlorothiazide (HYDRODIURIL) 12.5 MG tablet      hyoscyamine (LEVBID) 0.375 MG 12 hr tablet Take 1 tablet by mouth 2 (two) times daily.     montelukast  (SINGULAIR) 10 MG tablet Take 10 mg by mouth daily.      POTASSIUM GLUCONATE PO Take 99 mg by mouth.     Probiotic Product (PRO-BIOTIC BLEND PO) Take by mouth daily.     propranolol (INDERAL) 40 MG tablet Take 40 mg by mouth 2 (two) times daily.     sodium chloride (OCEAN) 0.65 % nasal spray Place 1 spray into the nose as needed for congestion.     TART CHERRY PO Take by mouth.     triamcinolone cream (KENALOG) 0.1 % daily as needed.  1   TURMERIC PO Take by mouth.     vitamin C (ASCORBIC ACID) 500 MG tablet Take 500 mg by mouth daily.     zinc gluconate 50 MG tablet Take 50 mg by mouth daily.     alendronate (FOSAMAX) 70 MG tablet Take 1 tablet (70 mg total) by mouth every 7 (seven) days. Take first thing in am with 6 oz. Water.  Be upright after taking.  Eat nothing for one hour. (Patient not taking: Reported on 08/20/2021) 4 tablet 12   fluticasone (FLOVENT DISKUS) 50 MCG/BLIST diskus inhaler Inhale 1 puff into the lungs 2 (two) times daily. (Patient not taking: Reported on 01/16/2021)     No current facility-administered medications on file  prior to visit.    ALLERGIES: Levofloxacin, Azithromycin, Ciprofloxacin, Codeine, Demerol [meperidine], Provera [medroxyprogesterone acetate], Sulfa antibiotics, and Percocet [oxycodone-acetaminophen]  Family History  Problem Relation Age of Onset   Heart failure Mother    Ulcers Mother    Hypertension Mother    Pleurisy Father    Cancer Father    Cancer Maternal Aunt        uterine cancer   Cancer Paternal Aunt        lymphoma   Diabetes Maternal Grandmother    Diverticulitis Son    Diverticulitis Daughter     SH:  divorced, non smoker  Review of Systems  All other systems reviewed and are negative.  PHYSICAL EXAMINATION:    BP (!) 148/87 (BP Location: Right Arm, Patient Position: Sitting, Cuff Size: Normal)    Pulse 70    Ht 5' 4.75" (1.645 m) Comment: Patient did have her shoes on   Wt 153 lb (69.4 kg)    LMP 12/05/1975    BMI 25.66  kg/m      Physical Exam Constitutional:      Appearance: Normal appearance.  Chest:  Breasts:    Right: Skin change (small area of shiny and erythematous skin on nipple) present. No swelling, inverted nipple, mass, nipple discharge or tenderness.     Left: Inverted nipple (minimal inversion, stable) present. No swelling, mass, nipple discharge, skin change or tenderness.  Musculoskeletal:     Cervical back: Normal range of motion.  Lymphadenopathy:     Cervical: No cervical adenopathy.     Upper Body:     Right upper body: No supraclavicular, axillary or pectoral adenopathy.     Left upper body: No supraclavicular, axillary or pectoral adenopathy.  Neurological:     Mental Status: She is alert.  Psychiatric:        Mood and Affect: Mood normal.          Chaperone, Ina Homes, CMA, was present for exam.  Assessment/Plan: 1. Nipple lesion - with no palpable finding and only the skin change noted, I do not feel this is anything worrisome.  However, I do think it is reasonable to recheck in several weeks to ensure this hasn't changed.   - pt and I both agree she does not need imaging at this time but would recommend if there is any new palpable change - will plan to recheck 6 weeks   Total time with pt and with documentation:  21 minutes

## 2021-08-28 ENCOUNTER — Ambulatory Visit: Payer: Medicare PPO | Admitting: Family Medicine

## 2021-09-11 ENCOUNTER — Other Ambulatory Visit: Payer: Self-pay

## 2021-09-11 ENCOUNTER — Ambulatory Visit: Payer: Medicare PPO | Admitting: Podiatry

## 2021-09-11 DIAGNOSIS — M79675 Pain in left toe(s): Secondary | ICD-10-CM

## 2021-09-11 DIAGNOSIS — M79674 Pain in right toe(s): Secondary | ICD-10-CM | POA: Diagnosis not present

## 2021-09-11 DIAGNOSIS — Q828 Other specified congenital malformations of skin: Secondary | ICD-10-CM

## 2021-09-11 DIAGNOSIS — B351 Tinea unguium: Secondary | ICD-10-CM | POA: Diagnosis not present

## 2021-09-11 NOTE — Progress Notes (Addendum)
Subjective: ?81 y.o. returns the office today for painful, elongated, thickened toenails which she cannot trim herself and for calluses.  Denies any redness or drainage or any open lesions.  She has no new concerns today.  No fevers or chills. ? ?PCP: [name], MD ? ?Objective: ?AAO ?3, NAD ?DP/PT pulses palpable, CRT less than 3 seconds ?Nails hypertrophic, dystrophic, elongated, brittle, discolored ?10. There is tenderness overlying the nails 1-5 bilaterally. There is no surrounding erythema or drainage along the nail sites. ?There is mild hyperkeratotic lesion bilateral foot submetatarsal 5.  There is no underlying ulceration drainage or any signs of infection. ?Hammertoes present and a small scab is present on the right second toe without signs of infection noted. ?No pain with calf compression, swelling, warmth, erythema. ? ?Assessment: ?Patient presents with symptomatic onychomycosis; hyperkeratotic lesions ? ?Plan: ?-Treatment options including alternatives, risks, complications were discussed ?-Nails sharply debrided ?10 without complication/bleeding.  Urea cream for toenails ?-Minimal hyperkeratotic lesion sharply x2 without any complications or bleeding as a courtesy, there is minimal tissue.  Recommend moisturizer and offloading. ?-Monitor the hammertoe, scab.  Monitoring signs or symptoms of infection or worsening. ?-Discussed daily foot inspection. If there are any changes, to call the office immediately.  ?-Follow-up in 3 months or sooner if any problems are to arise. In the meantime, encouraged to call the office with any questions, concerns, changes symptoms. ? ?Ovid Curd, DPM ? ? ?

## 2021-09-15 NOTE — Progress Notes (Unsigned)
[facility] [address] [place] [address] Phone: [phone] Subjective:    I'm seeing this patient by the request  of:  [name], MD  CC:   [id number]  04/23/2021 Doing well since injection discussed HEP  Discussed which activities to do and which ones to avoid.  Increase activity as tolerated  See me again in 4 to 6 months  Update 09/18/2021 Ver[facility] is a 81 y.o. female coming in with complaint of R knee pain. Patient states    Past Medical History:  Diagnosis Date   Diverticulosis    Fibromyalgia    GERD (gastroesophageal reflux disease)    Hyperlipidemia    Hypertension    IBS (irritable bowel syndrome)    Left fibular fracture 10/2013   MVP (mitral valve prolapse)    Patellar dislocation 10/2013   Left   Patellar fracture    Left   Past Surgical History:  Procedure Laterality Date   BILATERAL SALPINGOOPHORECTOMY  07/10/1998   Endometrosis and LOA   BREAST BIOPSY Bilateral 3/10   BREAST BIOPSY Right 1959   BREAST BIOPSY Right 1970's & 1981   CHOLECYSTECTOMY  02/2004   COLONOSCOPY     thigh lipoma Right    removed   TOOTH EXTRACTION     UMBILICAL HERNIA REPAIR  07/10/1998   at same time of Ophorectomy   VAGINAL HYSTERECTOMY  1977   Social History   Socioeconomic History   Marital status: Divorced    Spouse name: Not on file   Number of children: 3   Years of education: Not on file   Highest education level: Not on file  Occupational History   Not on file  Tobacco Use   Smoking status: Never   Smokeless tobacco: Never  Vaping Use   Vaping Use: Never used  Substance and Sexual Activity   Alcohol use: No   Drug use: No   Sexual activity: Not Currently    Partners: Male  Other Topics Concern   Not on file  Social History Narrative   Not on file   Social Determinants of Health   Financial Resource Strain: Not on file  Food Insecurity: Not on file  Transportation Needs: Not on file   Physical Activity: Not on file  Stress: Not on file  Social Connections: Not on file   Allergies  Allergen Reactions   Levofloxacin     Muscle pain  Tendonitis    Azithromycin Diarrhea    Extreme diarrhea   Ciprofloxacin    Codeine Nausea And Vomiting   Demerol [Meperidine] Nausea And Vomiting   Provera [Medroxyprogesterone Acetate]     unknown   Sulfa Antibiotics Nausea And Vomiting   Percocet [Oxycodone-Acetaminophen] Nausea And Vomiting   Family History  Problem Relation Age of Onset   Heart failure Mother    Ulcers Mother    Hypertension Mother    Pleurisy Father    Cancer Father    Cancer Maternal Aunt        uterine cancer   Cancer Paternal Aunt        lymphoma   Diabetes Maternal Grandmother    Diverticulitis Son    Diverticulitis Daughter     Current Outpatient Medications (Endocrine & Metabolic):    alendronate (FOSAMAX) 70 MG tablet, Take 1 tablet (70 mg total) by mouth every 7 (seven) days. Take first thing in am with 6 oz. Water.  Be upright after taking.  Eat nothing for  one hour. (Patient not taking: Reported on 08/20/2021)  Current Outpatient Medications (Cardiovascular):    hydrochlorothiazide (HYDRODIURIL) 12.5 MG tablet,    propranolol (INDERAL) 40 MG tablet, Take 40 mg by mouth 2 (two) times daily.  Current Outpatient Medications (Respiratory):    fluticasone (FLOVENT DISKUS) 50 MCG/BLIST diskus inhaler, Inhale 1 puff into the lungs 2 (two) times daily. (Patient not taking: Reported on 01/16/2021)   montelukast (SINGULAIR) 10 MG tablet, Take 10 mg by mouth daily.    sodium chloride (OCEAN) 0.65 % nasal spray, Place 1 spray into the nose as needed for congestion.   Current Outpatient Medications (Hematological):    Cyanocobalamin (B-12 PO), Take 1,000 mcg by mouth.  Current Outpatient Medications (Other):    Cholecalciferol (VITAMIN D3) 2000 units TABS, Take by mouth daily.   hyoscyamine (LEVBID) 0.375 MG 12 hr tablet, Take 1 tablet by mouth 2  (two) times daily.   POTASSIUM GLUCONATE PO, Take 99 mg by mouth.   Probiotic Product (PRO-BIOTIC BLEND PO), Take by mouth daily.   TART CHERRY PO, Take by mouth.   triamcinolone cream (KENALOG) 0.1 %, daily as needed.   TURMERIC PO, Take by mouth.   vitamin C (ASCORBIC ACID) 500 MG tablet, Take 500 mg by mouth daily.   zinc gluconate 50 MG tablet, Take 50 mg by mouth daily.   Reviewed prior external information including notes and imaging from  primary care provider As well as notes that were available from care everywhere and other healthcare systems.  Past medical history, social, surgical and family history all reviewed in electronic medical record.  No pertanent information unless stated regarding to the chief complaint.   Review of Systems:  No headache, visual changes, nausea, vomiting, diarrhea, constipation, dizziness, abdominal pain, skin rash, fevers, chills, night sweats, weight loss, swollen lymph nodes, body aches, joint swelling, chest pain, shortness of breath, mood changes. POSITIVE muscle aches  Objective  Last menstrual period 12/05/1975.   General: No apparent distress alert and oriented x3 mood and affect normal, dressed appropriately.  HEENT: Pupils equal, extraocular movements intact  Respiratory: Patient's speak in full sentences and does not appear short of breath  Cardiovascular: No lower extremity edema, non tender, no erythema  Gait normal with good balance and coordination.  MSK:  Non tender with full range of motion and good stability and symmetric strength and tone of shoulders, elbows, wrist, hip, knee and ankles bilaterally.     Impression and Recommendations:     The above documentation has been reviewed and is accurate and complete Wilford Grist

## 2021-09-18 ENCOUNTER — Encounter: Payer: Self-pay | Admitting: Family Medicine

## 2021-09-18 ENCOUNTER — Other Ambulatory Visit: Payer: Self-pay

## 2021-09-18 ENCOUNTER — Ambulatory Visit: Payer: Medicare PPO | Admitting: Family Medicine

## 2021-09-18 ENCOUNTER — Ambulatory Visit (INDEPENDENT_AMBULATORY_CARE_PROVIDER_SITE_OTHER): Payer: Medicare PPO

## 2021-09-18 VITALS — BP 122/82 | HR 75 | Ht 64.5 in | Wt 150.0 lb

## 2021-09-18 DIAGNOSIS — M546 Pain in thoracic spine: Secondary | ICD-10-CM | POA: Diagnosis not present

## 2021-09-18 DIAGNOSIS — M1711 Unilateral primary osteoarthritis, right knee: Secondary | ICD-10-CM

## 2021-09-18 DIAGNOSIS — M549 Dorsalgia, unspecified: Secondary | ICD-10-CM | POA: Diagnosis not present

## 2021-09-18 DIAGNOSIS — G8929 Other chronic pain: Secondary | ICD-10-CM

## 2021-09-18 DIAGNOSIS — M542 Cervicalgia: Secondary | ICD-10-CM | POA: Diagnosis not present

## 2021-09-18 NOTE — Assessment & Plan Note (Addendum)
Patient has been doing remarkably well at the moment.  No significant change in management.  Follow-up with me again in 3 months if needed. ?

## 2021-09-18 NOTE — Patient Instructions (Addendum)
Xray today ?Towel between shoulder blades ?See me in 3 months ?

## 2021-09-18 NOTE — Assessment & Plan Note (Signed)
Thoracic back pain and some neck pain.  Very situational at this time.  Does have a history of osteoporosis but does not give her any difficulty during any other times a day.  Discussed other activity modifications that could be helpful.  Worsening pain can see [place] sooner otherwise we will check in in 3 months ?

## 2021-10-14 DIAGNOSIS — I10 Essential (primary) hypertension: Secondary | ICD-10-CM | POA: Diagnosis not present

## 2021-10-14 DIAGNOSIS — M8588 Other specified disorders of bone density and structure, other site: Secondary | ICD-10-CM | POA: Diagnosis not present

## 2021-10-14 DIAGNOSIS — E781 Pure hyperglyceridemia: Secondary | ICD-10-CM | POA: Diagnosis not present

## 2021-10-21 DIAGNOSIS — I10 Essential (primary) hypertension: Secondary | ICD-10-CM | POA: Diagnosis not present

## 2021-10-21 DIAGNOSIS — J310 Chronic rhinitis: Secondary | ICD-10-CM | POA: Diagnosis not present

## 2021-10-21 DIAGNOSIS — M5430 Sciatica, unspecified side: Secondary | ICD-10-CM | POA: Diagnosis not present

## 2021-10-21 DIAGNOSIS — J309 Allergic rhinitis, unspecified: Secondary | ICD-10-CM | POA: Diagnosis not present

## 2021-10-21 DIAGNOSIS — K589 Irritable bowel syndrome without diarrhea: Secondary | ICD-10-CM | POA: Diagnosis not present

## 2021-10-21 DIAGNOSIS — Z0000 Encounter for general adult medical examination without abnormal findings: Secondary | ICD-10-CM | POA: Diagnosis not present

## 2021-10-21 DIAGNOSIS — E781 Pure hyperglyceridemia: Secondary | ICD-10-CM | POA: Diagnosis not present

## 2021-10-21 DIAGNOSIS — I700 Atherosclerosis of aorta: Secondary | ICD-10-CM | POA: Diagnosis not present

## 2021-10-23 ENCOUNTER — Ambulatory Visit (HOSPITAL_BASED_OUTPATIENT_CLINIC_OR_DEPARTMENT_OTHER): Payer: Medicare PPO | Admitting: Obstetrics & Gynecology

## 2021-10-24 ENCOUNTER — Encounter (HOSPITAL_BASED_OUTPATIENT_CLINIC_OR_DEPARTMENT_OTHER): Payer: Self-pay | Admitting: Obstetrics & Gynecology

## 2021-10-24 ENCOUNTER — Ambulatory Visit (HOSPITAL_BASED_OUTPATIENT_CLINIC_OR_DEPARTMENT_OTHER): Payer: Medicare PPO | Admitting: Obstetrics & Gynecology

## 2021-10-24 VITALS — BP 158/78 | HR 70 | Ht 64.5 in | Wt 151.2 lb

## 2021-10-24 DIAGNOSIS — N649 Disorder of breast, unspecified: Secondary | ICD-10-CM | POA: Diagnosis not present

## 2021-10-24 NOTE — Progress Notes (Signed)
GYNECOLOGY  VISIT ? ?CC:   breast recheck ? ?HPI: ?81 y.o. G3P3 Divorced White or Caucasian female here for breast recheck.  Has a small breast lesion she noted after Christmas.  Has shiny on the right nipple.  There has possibly been a tiny amount of discharge as she's noticed a very almost string like connect that is on the nipple.  No pain.  No blood.  No trauma.  Last mammogram was august.  Pt hasn't noticed any significant change.  Denies pain.   ? ? ? ?Patient Active Problem List  ? Diagnosis Date Noted  ? Pain due to onychomycosis of toenails of both feet 03/24/2021  ? Patellofemoral arthritis of right knee 03/12/2020  ? Back pain 10/31/2019  ? Epistaxis, recurrent 08/09/2018  ? Gastroesophageal reflux disease without esophagitis 10/20/2016  ? 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 Heart palpitations 12/05/2014  ? Mitral valve prolapse 12/05/2014  ? Rectocele 04/25/2014  ? Cystocele 04/25/2014  ? ? ?Past Medical History:  ?Diagnosis Date  ? Diverticulosis   ? Fibromyalgia   ? GERD (gastroesophageal reflux disease)   ? Hyperlipidemia   ? Hypertension   ? IBS (irritable bowel syndrome)   ? Left fibular fracture 10/2013  ? MVP (mitral valve prolapse)   ? Patellar dislocation 10/2013  ? Left  ? Patellar fracture   ? Left  ? ? ?Past Surgical History:  ?Procedure Laterality Date  ? BILATERAL SALPINGOOPHORECTOMY  07/10/1998  ? Endometrosis and LOA  ? BREAST BIOPSY Bilateral 3/10  ? BREAST BIOPSY Right 1959  ? BREAST BIOPSY Right 1970's & 1981  ? CHOLECYSTECTOMY  02/2004  ? COLONOSCOPY    ? thigh lipoma Right   ? removed  ? TOOTH EXTRACTION    ? UMBILICAL HERNIA REPAIR  07/10/1998  ? at same time of Ophorectomy  ? VAGINAL HYSTERECTOMY  1977  ? ? ?MEDS:   ?Current Outpatient Medications on File Prior to Visit  ?Medication Sig Dispense Refill  ? alendronate (FOSAMAX) 70 MG tablet Take 1 tablet (70 mg total) by mouth every 7 (seven) days. Take first thing in am with 6 oz. Water.  Be upright after taking.  Eat nothing for one  hour. 4 tablet 12  ? Cholecalciferol (VITAMIN D3) 2000 units TABS Take by mouth daily.    ? Cyanocobalamin (B-12 PO) Take 1,000 mcg by mouth.    ? fluticasone (FLOVENT DISKUS) 50 MCG/BLIST diskus inhaler Inhale 1 puff into the lungs 2 (two) times daily.    ? hydrochlorothiazide (HYDRODIURIL) 12.5 MG tablet     ? hyoscyamine (LEVBID) 0.375 MG 12 hr tablet Take 1 tablet by mouth 2 (two) times daily.    ? montelukast (SINGULAIR) 10 MG tablet Take 10 mg by mouth daily.     ? POTASSIUM GLUCONATE PO Take 99 mg by mouth.    ? Probiotic Product (PRO-BIOTIC BLEND PO) Take by mouth daily.    ? propranolol (INDERAL) 40 MG tablet Take 40 mg by mouth 2 (two) times daily.    ? sodium chloride (OCEAN) 0.65 % nasal spray Place 1 spray into the nose as needed for congestion.    ? TART CHERRY PO Take by mouth.    ? triamcinolone cream (KENALOG) 0.1 % daily as needed.  1  ? TURMERIC PO Take by mouth.    ? vitamin C (ASCORBIC ACID) 500 MG tablet Take 500 mg by mouth daily.    ? zinc gluconate 50 MG tablet Take 50 mg by mouth daily.    ? ?  No current facility-administered medications on file prior to visit.  ? ? ?ALLERGIES: Levofloxacin, Azithromycin, Ciprofloxacin, Codeine, Demerol [meperidine], Provera [medroxyprogesterone acetate], Sulfa antibiotics, and Percocet [oxycodone-acetaminophen] ? ?Family History  ?Problem Relation Age of Onset  ? Heart failure Mother   ? Ulcers Mother   ? Hypertension Mother   ? Pleurisy Father   ? Cancer Father   ? Cancer Maternal Aunt   ?     uterine cancer  ? Cancer Paternal Aunt   ?     lymphoma  ? Diabetes Maternal Grandmother   ? Diverticulitis Son   ? Diverticulitis Daughter   ? ? ?SH:  divorced, non smoker ? ?Review of Systems  ?All other systems reviewed and are negative. ? ?PHYSICAL EXAMINATION:   ? ?BP (!) 158/78 (BP Location: Right Arm, Patient Position: Sitting, Cuff Size: Normal)   Pulse 70   Ht 5' 4.5" (1.638 m)   Wt 151 lb 3.2 oz (68.6 kg)   LMP 12/05/1975   BMI 25.55 kg/m?      ?Physical Exam ?Constitutional:   ?   Appearance: Normal appearance.  ?Chest:  ? ? ?Neurological:  ?   General: No focal deficit present.  ?   Mental Status: She is alert.  ?Psychiatric:     ?   Mood and Affect: Mood normal.  ? ? ? ?Assessment/Plan: ?1. Nipple lesion ?- will contact radiology to see about possible biopsy of the nipple and get back with pt about scheduling ?- she does have dermatology appt scheduled in a couple of weeks if cannot get biopsy done with radiology, possibly can have done there. ? ?Total time with pt and documentation:  21 minutes ?

## 2021-11-06 ENCOUNTER — Other Ambulatory Visit (HOSPITAL_BASED_OUTPATIENT_CLINIC_OR_DEPARTMENT_OTHER): Payer: Self-pay | Admitting: Obstetrics & Gynecology

## 2021-11-06 DIAGNOSIS — N649 Disorder of breast, unspecified: Secondary | ICD-10-CM

## 2021-11-13 ENCOUNTER — Ambulatory Visit: Payer: Medicare PPO | Admitting: Podiatry

## 2021-11-13 DIAGNOSIS — M79674 Pain in right toe(s): Secondary | ICD-10-CM | POA: Diagnosis not present

## 2021-11-13 DIAGNOSIS — M79675 Pain in left toe(s): Secondary | ICD-10-CM | POA: Diagnosis not present

## 2021-11-13 DIAGNOSIS — B351 Tinea unguium: Secondary | ICD-10-CM | POA: Diagnosis not present

## 2021-11-13 NOTE — Progress Notes (Signed)
Subjective: ?81 y.o. returns the office today for painful, elongated, thickened toenails which she cannot trim herself and for calluses.  Denies any redness or drainage or any open lesions.  She has no new concerns today.  No fevers or chills. ? ?PCP: [name], MD ? ?Objective: ?AAO ?3, NAD ?DP/PT pulses palpable, CRT less than 3 seconds ?Nails hypertrophic, dystrophic, elongated, brittle, discolored ?10. There is tenderness overlying the nails 1-5 bilaterally. There is no surrounding erythema or drainage along the nail sites. ?There is hyperkeratotic lesion bilateral foot submetatarsal 5.  New lesions on the lateral left hallux IPJ and the medial 2nd toe. There is no underlying ulceration drainage or any signs of infection. ?Hammertoes present and a small scab is present on the right second toe without signs of infection noted. ?HAV left.  ?No pain with calf compression, swelling, warmth, erythema. ? ?Assessment: ?Patient presents with symptomatic onychomycosis; hyperkeratotic lesions ? ?Plan: ?-Treatment options including alternatives, risks, complications were discussed ?-Nails sharply debrided ?10 without complication/bleeding.  Urea cream for toenails ?-Minimal hyperkeratotic lesion sharply x4 without any complications or bleeding as a courtesy, there is minimal tissue.  Recommend moisturizer and offloading. ?-Discussed daily foot inspection. If there are any changes, to call the office immediately.  ?-Follow-up in 3 months or sooner if any problems are to arise. In the meantime, encouraged to call the office with any questions, concerns, changes symptoms. ? ?Ovid Curd, DPM ? ? ?

## 2021-11-18 DIAGNOSIS — L814 Other melanin hyperpigmentation: Secondary | ICD-10-CM | POA: Diagnosis not present

## 2021-11-18 DIAGNOSIS — L821 Other seborrheic keratosis: Secondary | ICD-10-CM | POA: Diagnosis not present

## 2021-11-18 DIAGNOSIS — D225 Melanocytic nevi of trunk: Secondary | ICD-10-CM | POA: Diagnosis not present

## 2021-11-18 DIAGNOSIS — D485 Neoplasm of uncertain behavior of skin: Secondary | ICD-10-CM | POA: Diagnosis not present

## 2021-11-18 DIAGNOSIS — Z85828 Personal history of other malignant neoplasm of skin: Secondary | ICD-10-CM | POA: Diagnosis not present

## 2021-11-18 DIAGNOSIS — D1801 Hemangioma of skin and subcutaneous tissue: Secondary | ICD-10-CM | POA: Diagnosis not present

## 2021-11-18 DIAGNOSIS — D241 Benign neoplasm of right breast: Secondary | ICD-10-CM | POA: Diagnosis not present

## 2021-11-19 DIAGNOSIS — J40 Bronchitis, not specified as acute or chronic: Secondary | ICD-10-CM | POA: Diagnosis not present

## 2021-11-19 DIAGNOSIS — Z03818 Encounter for observation for suspected exposure to other biological agents ruled out: Secondary | ICD-10-CM | POA: Diagnosis not present

## 2021-11-19 DIAGNOSIS — R059 Cough, unspecified: Secondary | ICD-10-CM | POA: Diagnosis not present

## 2021-11-27 ENCOUNTER — Encounter (HOSPITAL_BASED_OUTPATIENT_CLINIC_OR_DEPARTMENT_OTHER): Payer: Self-pay | Admitting: Obstetrics & Gynecology

## 2021-12-09 ENCOUNTER — Other Ambulatory Visit: Payer: Self-pay | Admitting: Surgery

## 2021-12-09 DIAGNOSIS — N63 Unspecified lump in unspecified breast: Secondary | ICD-10-CM | POA: Diagnosis not present

## 2021-12-19 ENCOUNTER — Other Ambulatory Visit: Payer: Self-pay | Admitting: Surgery

## 2021-12-19 DIAGNOSIS — N6310 Unspecified lump in the right breast, unspecified quadrant: Secondary | ICD-10-CM

## 2021-12-23 ENCOUNTER — Encounter (HOSPITAL_BASED_OUTPATIENT_CLINIC_OR_DEPARTMENT_OTHER): Payer: Self-pay | Admitting: Obstetrics & Gynecology

## 2021-12-23 NOTE — Progress Notes (Unsigned)
[name] [address] [place] [address] Phone: [phone] Subjective:   [name], am serving as a scribe for Dr. Antoine Primas.   I'm seeing this patient by the request  of:  [name], MD  CC: Low back pain  [id number]  09/18/2021 Thoracic back pain and some neck pain.  Very situational at this time.  Does have a history of osteoporosis but does not give her any difficulty during any other times a day.  Discussed other activity modifications that could be helpful.  Worsening pain can see [place] sooner otherwise we will check in in 3 months  Patient has been doing remarkably well at the moment.  No significant change in management.  Follow-up with me again in 3 months if needed.  Update 12/24/2021 RANETTE LUCKADOO is a 81 y.o. female coming in with complaint of R knee, C, T, and L spine pain. Patient states that she has had a few days in the past few weeks where her pain increased but not enough to treat her symptoms. Patient states that her neck is feeling better. Trying new pillows. Does continue to have lumbar spine pain that is better than it has been. Pain worse in the mornings and when she sits up from chair at the salon.   Cervical Xray 09/18/2021 IMPRESSION: 1. Multilevel degenerative disc disease and facet hypertrophy. 2. Trace retrolisthesis of C5 on C6 and anterolisthesis of C7 on T1.  Thoracic Xray 09/18/2021 IMPRESSION: No evidence of acute abnormality.   Mild multilevel degenerative disc disease/spondylosis.    Past Medical History:  Diagnosis Date   Diverticulosis    Fibromyalgia    GERD (gastroesophageal reflux disease)    Hyperlipidemia    Hypertension    IBS (irritable bowel syndrome)    Left fibular fracture 10/2013   MVP (mitral valve prolapse)    Patellar dislocation 10/2013   Left   Patellar fracture    Left   Past Surgical History:  Procedure Laterality Date   BILATERAL SALPINGOOPHORECTOMY   07/10/1998   Endometrosis and LOA   BREAST BIOPSY Bilateral 3/10   BREAST BIOPSY Right 1959   BREAST BIOPSY Right 1970's & 1981   CHOLECYSTECTOMY  02/2004   COLONOSCOPY     thigh lipoma Right    removed   TOOTH EXTRACTION     UMBILICAL HERNIA REPAIR  07/10/1998   at same time of Ophorectomy   VAGINAL HYSTERECTOMY  1977   Social History   Socioeconomic History   Marital status: Divorced    Spouse name: Not on file   Number of children: 3   Years of education: Not on file   Highest education level: Not on file  Occupational History   Not on file  Tobacco Use   Smoking status: Never   Smokeless tobacco: Never  Vaping Use   Vaping Use: Never used  Substance and Sexual Activity   Alcohol use: No   Drug use: No   Sexual activity: Not Currently    Partners: Male  Other Topics Concern   Not on file  Social History Narrative   Not on file   Social Determinants of Health   Financial Resource Strain: Not on file  Food Insecurity: Not on file  Transportation Needs: Not on file  Physical Activity: Not on file  Stress: Not on file  Social Connections: Not on file   Allergies  Allergen Reactions   Levofloxacin     Muscle pain  Tendonitis    Azithromycin Diarrhea    Extreme diarrhea   Ciprofloxacin    Codeine Nausea And Vomiting   Demerol [Meperidine] Nausea And Vomiting   Provera [Medroxyprogesterone Acetate]     unknown   Sulfa Antibiotics Nausea And Vomiting   Percocet [Oxycodone-Acetaminophen] Nausea And Vomiting   Family History  Problem Relation Age of Onset   Heart failure Mother    Ulcers Mother    Hypertension Mother    Pleurisy Father    Cancer Father    Cancer Maternal Aunt        uterine cancer   Cancer Paternal Aunt        lymphoma   Diabetes Maternal Grandmother    Diverticulitis Son    Diverticulitis Daughter     Current Outpatient Medications (Endocrine & Metabolic):    alendronate (FOSAMAX) 70 MG tablet, Take 1 tablet (70 mg total) by mouth  every 7 (seven) days. Take first thing in am with 6 oz. Water.  Be upright after taking.  Eat nothing for one hour.  Current Outpatient Medications (Cardiovascular):    hydrochlorothiazide (HYDRODIURIL) 12.5 MG tablet,    propranolol (INDERAL) 40 MG tablet, Take 40 mg by mouth 2 (two) times daily.  Current Outpatient Medications (Respiratory):    fluticasone (FLOVENT DISKUS) 50 MCG/BLIST diskus inhaler, Inhale 1 puff into the lungs 2 (two) times daily.   montelukast (SINGULAIR) 10 MG tablet, Take 10 mg by mouth daily.    sodium chloride (OCEAN) 0.65 % nasal spray, Place 1 spray into the nose as needed for congestion.   Current Outpatient Medications (Hematological):    Cyanocobalamin (B-12 PO), Take 1,000 mcg by mouth.  Current Outpatient Medications (Other):    Cholecalciferol (VITAMIN D3) 2000 units TABS, Take by mouth daily.   hyoscyamine (LEVBID) 0.375 MG 12 hr tablet, Take 1 tablet by mouth 2 (two) times daily.   POTASSIUM GLUCONATE PO, Take 99 mg by mouth.   Probiotic Product (PRO-BIOTIC BLEND PO), Take by mouth daily.   TART CHERRY PO, Take by mouth.   triamcinolone cream (KENALOG) 0.1 %, daily as needed.   TURMERIC PO, Take by mouth.   vitamin C (ASCORBIC ACID) 500 MG tablet, Take 500 mg by mouth daily.   zinc gluconate 50 MG tablet, Take 50 mg by mouth daily.    Review of Systems:  No headache, visual changes, nausea, vomiting, diarrhea, constipation, dizziness, abdominal pain, skin rash, fevers, chills, night sweats, weight loss, swollen lymph nodes, joint swelling, chest pain, shortness of breath, mood changes. POSITIVE muscle aches, body aches but patient thinks it is more of her fibromyalgia  Objective  Blood pressure (!) 120/98, pulse 63, height 5' 4.5" (1.638 m), weight 149 lb (67.6 kg), last menstrual period 12/05/1975, SpO2 97 %.   General: No apparent distress alert and oriented x3 mood and affect normal, dressed appropriately.  HEENT: Pupils equal, extraocular  movements intact  Respiratory: Patient's speak in full sentences and does not appear short of breath  Cardiovascular: No lower extremity edema, non tender, no erythema  Seems to be more of the upper thoracic back.  The patient is fairly stable overall with range of motion of the shoulders.  This is comfortably.    Impression and Recommendations:     The above documentation has been reviewed and is accurate and complete Judi Saa, DO

## 2021-12-24 ENCOUNTER — Ambulatory Visit: Payer: Medicare PPO | Admitting: Family Medicine

## 2021-12-24 DIAGNOSIS — M549 Dorsalgia, unspecified: Secondary | ICD-10-CM

## 2021-12-24 DIAGNOSIS — G8929 Other chronic pain: Secondary | ICD-10-CM

## 2021-12-24 NOTE — Patient Instructions (Signed)
Try wearable device with sleeping Have someone help you rotate mattress Continue vitamins Stay active throughout day Send me a message thru MyChart  See me in 2-3 months

## 2021-12-24 NOTE — Assessment & Plan Note (Signed)
Patient's back pain seems to be multifactorial.  Patient states it does not usually stop her from any activities.  Denies any association with bowel or bladder, or intermittent bleeding.  Patient states that the worst pain still seems to be more in the morning than anything else at the moment.  We discussed potential rotated and the mattress or going into getting new mattresses.  We discussed which activities to doing which ones to potentially avoid.  Continue the vitamin supplementations.  Would not want to make any significant changes otherwise.  Follow-up with me again in 3 months.  Total time reviewing patient's previous imaging and discussing with patient 33 minutes

## 2021-12-26 ENCOUNTER — Ambulatory Visit
Admission: RE | Admit: 2021-12-26 | Discharge: 2021-12-26 | Disposition: A | Discharge status: Home / Self Care | Payer: Medicare PPO | Source: Ambulatory Visit | Attending: Surgery | Admitting: Surgery

## 2021-12-26 DIAGNOSIS — N6489 Other specified disorders of breast: Secondary | ICD-10-CM | POA: Diagnosis not present

## 2021-12-26 DIAGNOSIS — N6310 Unspecified lump in the right breast, unspecified quadrant: Secondary | ICD-10-CM

## 2021-12-26 DIAGNOSIS — R922 Inconclusive mammogram: Secondary | ICD-10-CM | POA: Diagnosis not present

## 2021-12-30 ENCOUNTER — Other Ambulatory Visit: Payer: Self-pay | Admitting: Surgery

## 2022-01-02 NOTE — Progress Notes (Signed)
COVID Vaccine Completed: yes x2  Date of COVID positive in last 90 days:  PCP - Gildardo Cranker, MD Cardiologist -   Chest x-ray -  EKG -  Stress Test -  ECHO - greater than 2 years ago Cardiac Cath -  Pacemaker/ICD device last checked: Spinal Cord Stimulator:  Bowel Prep -   Sleep Study -  CPAP -   Fasting Blood Sugar -  Checks Blood Sugar _____ times a day  Blood Thinner Instructions: Aspirin Instructions: Last Dose:  Activity level:  Can go up a flight of stairs and perform activities of daily living without stopping and without symptoms of chest pain or shortness of breath.  Able to exercise without symptoms  Unable to go up a flight of stairs without symptoms of     Anesthesia review: atherosclerosis, HTN, MVP  Patient denies shortness of breath, fever, cough and chest pain at PAT appointment  Patient verbalized understanding of instructions that were given to them at the PAT appointment. Patient was also instructed that they will need to review over the PAT instructions again at home before surgery.

## 2022-01-05 NOTE — Patient Instructions (Signed)
DUE TO COVID-19 ONLY TWO VISITORS  (aged 81 and older)  ARE ALLOWED TO COME WITH YOU AND STAY IN THE WAITING ROOM ONLY DURING PRE OP AND PROCEDURE.   **NO VISITORS ARE ALLOWED IN THE SHORT STAY AREA OR RECOVERY ROOM!!**   Your procedure is scheduled on: 01/13/22   Report to [facility] Main Entrance    Report to admitting at 6:30 AM   Call this number if you have problems the morning of surgery [phone]   Do not eat food :After Midnight.   After Midnight you may have the following liquids until 5:45 AM DAY OF SURGERY  Water Black Coffee (sugar ok, NO MILK/CREAM OR CREAMERS)  Tea (sugar ok, NO MILK/CREAM OR CREAMERS) regular and decaf                             Plain Jell-O (NO RED)                                           Fruit ices (not with fruit pulp, NO RED)                                     Popsicles (NO RED)                                                                  Juice: apple, WHITE grape, WHITE cranberry Sports drinks like Gatorade (NO RED) Clear broth(vegetable,chicken,beef)     The day of surgery:  Drink ONE (1) Pre-Surgery Clear Ensure at 5:45 AM the morning of surgery. Drink in one sitting. Do not sip.  This drink was given to you during your hospital  pre-op appointment visit. Nothing else to drink after completing the  Pre-Surgery Clear Ensure.          If you have questions, please contact your surgeon's office.   FOLLOW BOWEL PREP AND ANY ADDITIONAL PRE OP INSTRUCTIONS YOU RECEIVED FROM YOUR SURGEON'S OFFICE!!!     Oral Hygiene is also important to reduce your risk of infection.                                    Remember - BRUSH YOUR TEETH THE MORNING OF SURGERY WITH YOUR REGULAR TOOTHPASTE   Take these medicines the morning of surgery with A SIP OF WATER: Singulair, Propranolol, Tylenol.                              You may not have any metal on your body including hair pins, jewelry, and body piercing             Do not wear  make-up, lotions, powders, perfumes, or deodorant  Do not wear nail polish including gel and S&S, artificial/acrylic nails, or any other type of covering on natural nails including finger and toenails. If you have artificial nails, gel coating, etc. that needs  to be removed by a nail salon please have this removed prior to surgery or surgery may need to be canceled/ delayed if the surgeon/ anesthesia feels like they are unable to be safely monitored.   Do not shave  48 hours prior to surgery.    Do not bring valuables to the hospital.  IS NOT             RESPONSIBLE   FOR VALUABLES.  DO NOT BRING YOUR HOME MEDICATIONS TO THE HOSPITAL. PHARMACY WILL DISPENSE MEDICATIONS LISTED ON YOUR MEDICATION LIST TO YOU DURING YOUR ADMISSION IN THE HOSPITAL!    Patients discharged on the day of surgery will not be allowed to drive home.  Someone NEEDS to stay with you for the first 24 hours after anesthesia.   Special Instructions: Bring a copy of your healthcare power of attorney and living will documents         the day of surgery if you haven't scanned them before.              Please read over the following fact sheets you were given: IF YOU HAVE QUESTIONS ABOUT YOUR PRE-OP INSTRUCTIONS PLEASE CALL [phone]- [facility] - Preparing for Surgery Before surgery, you can play an important role.  Because skin is not sterile, your skin needs to be as free of germs as possible.  You can reduce the number of germs on your skin by washing with CHG (chlorahexidine gluconate) soap before surgery.  CHG is an antiseptic cleaner which kills germs and bonds with the skin to continue killing germs even after washing. Please DO NOT use if you have an allergy to CHG or antibacterial soaps.  If your skin becomes reddened/irritated stop using the CHG and inform your nurse when you arrive at Short Stay. Do not shave (including legs and underarms) for at least 48 hours prior to the first CHG shower.   You may shave your face/neck.  Please follow these instructions carefully:  1.  Shower with CHG Soap the night before surgery and the  morning of surgery.  2.  If you choose to wash your hair, wash your hair first as usual with your normal  shampoo.  3.  After you shampoo, rinse your hair and body thoroughly to remove the shampoo.                             4.  Use CHG as you would any other liquid soap.  You can apply chg directly to the skin and wash.  Gently with a scrungie or clean washcloth.  5.  Apply the CHG Soap to your body ONLY FROM THE NECK DOWN.   Do   not use on face/ open                           Wound or open sores. Avoid contact with eyes, ears mouth and   genitals (private parts).                       Wash face,  Genitals (private parts) with your normal soap.             6.  Wash thoroughly, paying special attention to the area where your    surgery  will be performed.  7.  Thoroughly rinse your body with warm water from  the neck down.  8.  DO NOT shower/wash with your normal soap after using and rinsing off the CHG Soap.                9.  Pat yourself dry with a clean towel.            10.  Wear clean pajamas.            11.  Place clean sheets on your bed the night of your first shower and do not  sleep with pets. Day of Surgery : Do not apply any lotions/deodorants the morning of surgery.  Please wear clean clothes to the hospital/surgery center.  FAILURE TO FOLLOW THESE INSTRUCTIONS MAY RESULT IN THE CANCELLATION OF YOUR SURGERY  PATIENT SIGNATURE_________________________________  NURSE SIGNATURE__________________________________  ________________________________________________________________________

## 2022-01-07 ENCOUNTER — Encounter (HOSPITAL_COMMUNITY)
Admission: RE | Admit: 2022-01-07 | Discharge: 2022-01-07 | Disposition: A | Discharge status: Home / Self Care | Payer: Medicare PPO | Source: Ambulatory Visit | Attending: Surgery | Admitting: Surgery

## 2022-01-07 ENCOUNTER — Encounter (HOSPITAL_COMMUNITY): Payer: Self-pay

## 2022-01-07 VITALS — BP 141/64 | HR 64 | Temp 98.4°F | Resp 16 | Ht 64.5 in | Wt 147.6 lb

## 2022-01-07 DIAGNOSIS — I2510 Atherosclerotic heart disease of native coronary artery without angina pectoris: Secondary | ICD-10-CM | POA: Insufficient documentation

## 2022-01-07 DIAGNOSIS — Z01818 Encounter for other preprocedural examination: Secondary | ICD-10-CM | POA: Insufficient documentation

## 2022-01-07 HISTORY — DX: Pneumonia, unspecified organism: J18.9

## 2022-01-07 HISTORY — DX: Other complications of anesthesia, initial encounter: T88.59XA

## 2022-01-07 LAB — BASIC METABOLIC PANEL
Anion gap: 8 (ref 5–15)
BUN: 13 mg/dL (ref 8–23)
CO2: 27 mmol/L (ref 22–32)
Calcium: 9.8 mg/dL (ref 8.9–10.3)
Chloride: 107 mmol/L (ref 98–111)
Creatinine, Ser: 0.64 mg/dL (ref 0.44–1.00)
GFR, Estimated: >60 mL/min (ref >60)
Glucose, Bld: 103 mg/dL — ABNORMAL HIGH (ref 70–99)
Potassium: 3.9 mmol/L (ref 3.5–5.1)
Sodium: 142 mmol/L (ref 135–145)

## 2022-01-07 LAB — CBC
HCT: 43.5 % (ref 36.0–46.0)
Hemoglobin: 14.4 g/dL (ref 12.0–15.0)
MCH: 30.5 pg (ref 26.0–34.0)
MCHC: 33.1 g/dL (ref 30.0–36.0)
MCV: 92.2 fL (ref 80.0–100.0)
Platelets: 193 10*3/uL (ref 150–400)
RBC: 4.72 MIL/uL (ref 3.87–5.11)
RDW: 13.1 % (ref 11.5–15.5)
WBC: 5.4 10*3/uL (ref 4.0–10.5)
nRBC: 0 % (ref 0.0–0.2)

## 2022-01-12 NOTE — H&P (Signed)
REFERRING PHYSICIAN: Jerene Bears., MD  PROVIDER: Wayne Both, MD  MRN: [id number] DOB: 04/21/1941  Subjective   Chief Complaint: New Consultation (Nipple Lesion )   History of Present Illness: Veronica Franklin is a 81 y.o. female who is seen today as an office consultation for evaluation of New Consultation (Nipple Lesion ) .   This is a pleasant 80 year old female who is referred here for an abnormality of her right nipple. In January of this year she noticed a abnormal appearance to the nipple which she described as shiny. It has persisted so she ended up going to a dermatologist and a biopsy was performed of the mass at the nipple. 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 Her last mammograms were in August of last year and were unremarkable. She has no previous history of breast cancer had not been having drainage from the nipple  Review of Systems: A complete review of systems was obtained from the patient. I have reviewed this information and discussed as appropriate with the patient. See HPI as well for other ROS.  ROS   Medical History: Past Medical History:  Diagnosis Date  Arthritis   Patient Active Problem List  Diagnosis  Back pain  Bowel incontinence  Fatty liver  Gastroesophageal reflux disease without esophagitis  Heart palpitations  Irritable bowel syndrome with diarrhea  Lichen simplex chronicus  Mitral valve prolapse  Mucous retention cyst of maxillary sinus  Pain due to onychomycosis of toenails of both feet  Patellofemoral arthritis of right knee  Rectocele  Epistaxis, recurrent   Past Surgical History:  Procedure Laterality Date  Ear Surgery 1945  TONSILLECTOMY 1947  APPENDECTOMY 1953  HYSTERECTOMY 1977  OOPHORECTOMY Bilateral 2000  HERNIA REPAIR 2000  Umb.  Lipoma 2004  Right Thigh  CHOLECYSTECTOMY 2005  CATARACT EXTRACTION 2010  Breast Surgery  1959, 1971, 1987, 2010 Tumur/Cyst     Allergies  Allergen Reactions  Azithromycin Diarrhea  Extreme diarrhea  Ciprofloxacin Other (See Comments)  Codeine Nausea And Vomiting and Nausea  Medroxyprogesterone Acetate Unknown  unknown  Meperidine Nausea And Vomiting and Nausea  Other Nausea And Vomiting  Oxycodone-Acetaminophen Nausea And Vomiting, Nausea and Vomiting   No current outpatient medications on file prior to visit.   No current facility-administered medications on file prior to visit.   History reviewed. No pertinent family history.   Social History   Tobacco Use  Smoking Status Never  Smokeless Tobacco Never    Social History   Socioeconomic History  Marital status: Divorced  Tobacco Use  Smoking status: Never  Smokeless tobacco: Never  Substance and Sexual Activity  Alcohol use: Not Currently  Drug use: Never   Objective:   Vitals:   BP: (!) 142/62  Pulse: 100  Temp: 36.5 C (97.7 F)  SpO2: 98%  Weight: 68.4 kg (150 lb 12.8 oz)  Height: 163.8 cm (5' 4.5")   Body mass index is 25.49 kg/m.  Physical Exam   She appears well on exam There is a small wound centrally in the nipple with erythema. I can now not feel a specific mass secondary to this. There are no deep retroareolar masses and no axillary adenopathy  Labs, Imaging and Diagnostic Testing: I have seen the pathology results and reviewed her previous notes and mammogram from August last year  Assessment and Plan:   Diagnoses and all orders for this visit:  Mass of nipple - US breast limited right - Mammo diagnostic  digital right    At this point, I believe she needs a diagnostic ultrasound and mammogram of the right breast prior to considering any surgical excision. I explained to her that she may well lose the nipple and even the nipple areolar complex depending on the findings on the films. I will call her back once the ultrasound and mammogram have been performed and we will then discuss further how to proceed  with surgery. She understands and agrees with the plans>  Addendum:we discussed the results of the right breast mammogram and ultrasound. These were unremarkable. She still has the small lesion on the nipple so excision is recommended. She is eager to proceed with surgery which will be scheduled

## 2022-01-12 NOTE — Anesthesia Preprocedure Evaluation (Addendum)
Anesthesia Evaluation  Patient identified by MRN, date of birth, ID band Patient awake    Reviewed: Allergy & Precautions, NPO status , Patient's Chart, lab work & pertinent test results  History of Anesthesia Complications (+) PONV and history of anesthetic complications  Airway Mallampati: II  TM Distance: >3 FB Neck ROM: Full    Dental no notable dental hx. (+) Dental Advisory Given   Pulmonary neg pulmonary ROS,    Pulmonary exam normal        Cardiovascular hypertension, Pt. on medications and Pt. on home beta blockers Normal cardiovascular exam     Neuro/Psych negative neurological ROS     GI/Hepatic Neg liver ROS, GERD  ,IBS   Endo/Other  negative endocrine ROS  Renal/GU negative Renal ROS     Musculoskeletal  (+) Arthritis ,   Abdominal   Peds  Hematology negative hematology ROS (+)   Anesthesia Other Findings   Reproductive/Obstetrics                            Anesthesia Physical Anesthesia Plan  ASA: 2  Anesthesia Plan: MAC   Post-op Pain Management: Celebrex PO (pre-op)* and Tylenol PO (pre-op)*   Induction: Intravenous  PONV Risk Score and Plan: 4 or greater and Ondansetron, Dexamethasone and Diphenhydramine  Airway Management Planned: Natural Airway, Simple Face Mask and Nasal Cannula  Additional Equipment:   Intra-op Plan:   Post-operative Plan:   Informed Consent: I have reviewed the patients History and Physical, chart, labs and discussed the procedure including the risks, benefits and alternatives for the proposed anesthesia with the patient or authorized representative who has indicated his/her understanding and acceptance.     Dental advisory given  Plan Discussed with: Anesthesiologist and CRNA  Anesthesia Plan Comments:        Anesthesia Quick Evaluation

## 2022-01-13 ENCOUNTER — Other Ambulatory Visit: Payer: Self-pay

## 2022-01-13 ENCOUNTER — Ambulatory Visit (HOSPITAL_COMMUNITY)
Admission: RE | Admit: 2022-01-13 | Discharge: 2022-01-13 | Disposition: A | Discharge status: Home / Self Care | Payer: Medicare PPO | Source: Ambulatory Visit | Attending: Surgery | Admitting: Surgery

## 2022-01-13 ENCOUNTER — Encounter (HOSPITAL_COMMUNITY)
Admission: RE | Disposition: A | Discharge status: Home / Self Care | Payer: Self-pay | Source: Ambulatory Visit | Attending: Surgery

## 2022-01-13 ENCOUNTER — Ambulatory Visit (HOSPITAL_BASED_OUTPATIENT_CLINIC_OR_DEPARTMENT_OTHER): Payer: Medicare PPO | Admitting: Anesthesiology

## 2022-01-13 ENCOUNTER — Encounter (HOSPITAL_COMMUNITY): Payer: Self-pay | Admitting: Surgery

## 2022-01-13 ENCOUNTER — Ambulatory Visit (HOSPITAL_COMMUNITY): Payer: Medicare PPO | Admitting: Physician Assistant

## 2022-01-13 ENCOUNTER — Ambulatory Visit: Payer: Medicare PPO | Admitting: Podiatry

## 2022-01-13 DIAGNOSIS — M1990 Unspecified osteoarthritis, unspecified site: Secondary | ICD-10-CM

## 2022-01-13 DIAGNOSIS — N6489 Other specified disorders of breast: Secondary | ICD-10-CM | POA: Diagnosis not present

## 2022-01-13 DIAGNOSIS — N6011 Diffuse cystic mastopathy of right breast: Secondary | ICD-10-CM | POA: Diagnosis not present

## 2022-01-13 DIAGNOSIS — N6081 Other benign mammary dysplasias of right breast: Secondary | ICD-10-CM | POA: Insufficient documentation

## 2022-01-13 DIAGNOSIS — I10 Essential (primary) hypertension: Secondary | ICD-10-CM

## 2022-01-13 DIAGNOSIS — N62 Hypertrophy of breast: Secondary | ICD-10-CM | POA: Diagnosis not present

## 2022-01-13 HISTORY — PX: EXCISION OF BREAST LESION: SHX6676

## 2022-01-13 SURGERY — EXCISION OF BREAST LESION
Anesthesia: Monitor Anesthesia Care | Laterality: Right

## 2022-01-13 MED ORDER — LACTATED RINGERS IV SOLN: INTRAVENOUS | Status: DC

## 2022-01-13 MED ORDER — ONDANSETRON HCL 4 MG/2ML IJ SOLN
INTRAMUSCULAR | Status: DC | PRN
  Administered 2022-01-13: 4 mg via INTRAVENOUS

## 2022-01-13 MED ORDER — ONDANSETRON HCL 4 MG/2ML IJ SOLN
INTRAMUSCULAR | Status: AC
  Filled 2022-01-13: qty 2

## 2022-01-13 MED ORDER — ENSURE PRE-SURGERY PO LIQD
296.0000 mL | Freq: Once | ORAL | Status: DC
  Filled 2022-01-13: qty 296

## 2022-01-13 MED ORDER — LIDOCAINE HCL (PF) 1 % IJ SOLN
INTRAMUSCULAR | Status: DC | PRN
  Administered 2022-01-13: 7 mL

## 2022-01-13 MED ORDER — PROPOFOL 10 MG/ML IV BOLUS
INTRAVENOUS | Status: DC | PRN
  Administered 2022-01-13: 20 mg via INTRAVENOUS
  Administered 2022-01-13 (×2): 10 mg via INTRAVENOUS
  Administered 2022-01-13: 20 mg via INTRAVENOUS

## 2022-01-13 MED ORDER — FENTANYL CITRATE (PF) 100 MCG/2ML IJ SOLN
INTRAMUSCULAR | Status: AC
  Filled 2022-01-13: qty 2

## 2022-01-13 MED ORDER — CHLORHEXIDINE GLUCONATE CLOTH 2 % EX PADS: 6.0000 | MEDICATED_PAD | Freq: Once | CUTANEOUS | Status: DC

## 2022-01-13 MED ORDER — BUPIVACAINE HCL (PF) 0.5 % IJ SOLN
INTRAMUSCULAR | Status: AC
  Filled 2022-01-13: qty 30

## 2022-01-13 MED ORDER — PROPOFOL 10 MG/ML IV BOLUS
INTRAVENOUS | Status: AC
  Filled 2022-01-13: qty 20

## 2022-01-13 MED ORDER — LIDOCAINE HCL (PF) 2 % IJ SOLN
INTRAMUSCULAR | Status: AC
  Filled 2022-01-13: qty 5

## 2022-01-13 MED ORDER — ORAL CARE MOUTH RINSE: 15.0000 mL | Freq: Once | OROMUCOSAL | Status: AC

## 2022-01-13 MED ORDER — FENTANYL CITRATE (PF) 100 MCG/2ML IJ SOLN
INTRAMUSCULAR | Status: DC | PRN
  Administered 2022-01-13: 50 ug via INTRAVENOUS

## 2022-01-13 MED ORDER — LIDOCAINE-EPINEPHRINE (PF) 1 %-1:200000 IJ SOLN
INTRAMUSCULAR | Status: AC
  Filled 2022-01-13: qty 30

## 2022-01-13 MED ORDER — ACETAMINOPHEN 500 MG PO TABS: 1000.0000 mg | ORAL_TABLET | ORAL | Status: DC

## 2022-01-13 MED ORDER — 0.9 % SODIUM CHLORIDE (POUR BTL) OPTIME
TOPICAL | Status: DC | PRN
  Administered 2022-01-13: 1000 mL

## 2022-01-13 MED ORDER — LIDOCAINE HCL (CARDIAC) PF 100 MG/5ML IV SOSY
PREFILLED_SYRINGE | INTRAVENOUS | Status: DC | PRN
  Administered 2022-01-13: 40 mg via INTRAVENOUS

## 2022-01-13 MED ORDER — CHLORHEXIDINE GLUCONATE 0.12 % MT SOLN
15.0000 mL | Freq: Once | OROMUCOSAL | Status: AC
  Administered 2022-01-13: 15 mL via OROMUCOSAL

## 2022-01-13 MED ORDER — LIDOCAINE HCL (PF) 1 % IJ SOLN
INTRAMUSCULAR | Status: AC
  Filled 2022-01-13: qty 30

## 2022-01-13 MED ORDER — CEFAZOLIN SODIUM-DEXTROSE 2-4 GM/100ML-% IV SOLN
2.0000 g | INTRAVENOUS | Status: AC
  Administered 2022-01-13: 2 g via INTRAVENOUS
  Filled 2022-01-13: qty 100

## 2022-01-13 MED ORDER — ACETAMINOPHEN 500 MG PO TABS
1000.0000 mg | ORAL_TABLET | Freq: Once | ORAL | Status: DC
  Filled 2022-01-13: qty 2

## 2022-01-13 SURGICAL SUPPLY — 41 items
ADH SKN CLS APL DERMABOND .7 (GAUZE/BANDAGES/DRESSINGS) ×1
APL PRP STRL LF DISP 70% ISPRP (MISCELLANEOUS) ×1
BAG COUNTER SPONGE SURGICOUNT (BAG) IMPLANT
BAG SPNG CNTER NS LX DISP (BAG)
BLADE HEX COATED 2.75 (ELECTRODE) ×2 IMPLANT
BLADE SURG 15 STRL LF DISP TIS (BLADE) ×2 IMPLANT
BLADE SURG 15 STRL SS (BLADE)
BLADE SURG SZ10 CARB STEEL (BLADE) ×2 IMPLANT
CHLORAPREP W/TINT 26 (MISCELLANEOUS) ×3 IMPLANT
DERMABOND ADVANCED (GAUZE/BANDAGES/DRESSINGS) ×1
DERMABOND ADVANCED .7 DNX12 (GAUZE/BANDAGES/DRESSINGS) IMPLANT
DRAIN PENROSE 0.5X18 (DRAIN) ×2 IMPLANT
DRAPE LAPAROTOMY TRNSV 102X78 (DRAPES) ×3 IMPLANT
ELECT REM PT RETURN 15FT ADLT (MISCELLANEOUS) ×3 IMPLANT
GAUZE 4X4 16PLY ~~LOC~~+RFID DBL (SPONGE) IMPLANT
GAUZE SPONGE 4X4 12PLY STRL (GAUZE/BANDAGES/DRESSINGS) IMPLANT
GLOVE BIO SURGEON STRL SZ7.5 (GLOVE) ×3 IMPLANT
GOWN STRL REUS W/ TWL XL LVL3 (GOWN DISPOSABLE) ×4 IMPLANT
GOWN STRL REUS W/TWL XL LVL3 (GOWN DISPOSABLE) ×4
KIT BASIN OR (CUSTOM PROCEDURE TRAY) ×3 IMPLANT
KIT TURNOVER KIT A (KITS) IMPLANT
NDL HYPO 25X1 1.5 SAFETY (NEEDLE) ×2 IMPLANT
NEEDLE HYPO 22GX1.5 SAFETY (NEEDLE) IMPLANT
NEEDLE HYPO 25X1 1.5 SAFETY (NEEDLE) ×2 IMPLANT
NS IRRIG 1000ML POUR BTL (IV SOLUTION) ×3 IMPLANT
PACK BASIC VI WITH GOWN DISP (CUSTOM PROCEDURE TRAY) ×2 IMPLANT
PACK GENERAL/GYN (CUSTOM PROCEDURE TRAY) ×1 IMPLANT
PENCIL SMOKE EVACUATOR (MISCELLANEOUS) IMPLANT
SPIKE FLUID TRANSFER (MISCELLANEOUS) IMPLANT
SPONGE T-LAP 4X18 ~~LOC~~+RFID (SPONGE) ×2 IMPLANT
STRIP CLOSURE SKIN 1/2X4 (GAUZE/BANDAGES/DRESSINGS) IMPLANT
SUT MNCRL AB 4-0 PS2 18 (SUTURE) ×3 IMPLANT
SUT VIC AB 2-0 CT1 27 (SUTURE)
SUT VIC AB 2-0 CT1 TAPERPNT 27 (SUTURE) IMPLANT
SUT VIC AB 3-0 54XBRD REEL (SUTURE) IMPLANT
SUT VIC AB 3-0 BRD 54 (SUTURE)
SUT VIC AB 3-0 SH 27 (SUTURE)
SUT VIC AB 3-0 SH 27XBRD (SUTURE) IMPLANT
SYR BULB IRRIG 60ML STRL (SYRINGE) IMPLANT
SYR CONTROL 10ML LL (SYRINGE) ×3 IMPLANT
TOWEL OR 17X26 10 PK STRL BLUE (TOWEL DISPOSABLE) ×3 IMPLANT

## 2022-01-13 NOTE — Discharge Instructions (Signed)
You may shower starting tomorrow  You may start driving tomorrow  Ice pack, Tylenol, and ibuprofen for pain  No vigorous activity for 1 week

## 2022-01-13 NOTE — Interval H&P Note (Signed)
History and Physical Interval Note: no change in H and P  01/13/2022 7:03 AM  [name]  has presented today for surgery, with the diagnosis of RIGHT NIPPLE LESION.  The various methods of treatment have been discussed with the patient and family. After consideration of risks, benefits and other options for treatment, the patient has consented to  Procedure(s): EXCISION OF RIGHT NIPPLE LESION (Right) as a surgical intervention.  The patient's history has been reviewed, patient examined, no change in status, stable for surgery.  I have reviewed the patient's chart and labs.  Questions were answered to the patient's satisfaction.     Veronica Franklin

## 2022-01-13 NOTE — Anesthesia Postprocedure Evaluation (Signed)
Anesthesia Post Note  Patient: Veronica Franklin  Procedure(s) Performed: EXCISION OF RIGHT NIPPLE LESION (Right)     Patient location during evaluation: PACU Anesthesia Type: MAC Level of consciousness: awake and alert Pain management: pain level controlled Vital Signs Assessment: post-procedure vital signs reviewed and stable Respiratory status: spontaneous breathing and respiratory function stable Cardiovascular status: stable Postop Assessment: no apparent nausea or vomiting Anesthetic complications: no   No notable events documented.  Last Vitals:  Vitals:   01/13/22 0900 01/13/22 0915  BP: 119/64 123/63  Pulse: 65 66  Resp: 20 16  Temp:  36.8 C  SpO2: 97% 97%    Last Pain:  Vitals:   01/13/22 0915  TempSrc:   PainSc: 0-No pain                 Ashton Sabine,Topor DANIEL

## 2022-01-13 NOTE — Transfer of Care (Signed)
Immediate Anesthesia Transfer of Care Note  Patient: Veronica Franklin  Procedure(s) Performed: EXCISION OF RIGHT NIPPLE LESION (Right)  Patient Location: PACU  Anesthesia Type:MAC  Level of Consciousness: awake, alert  and oriented  Airway & Oxygen Therapy: Patient Spontanous Breathing  Post-op Assessment: Report given to RN and Post -op Vital signs reviewed and stable  Post vital signs: Reviewed and stable  Last Vitals:  Vitals Value Taken Time  BP 110/54 01/13/22 0845  Temp 36.7 C 01/13/22 0845  Pulse 62 01/13/22 0846  Resp 16 01/13/22 0846  SpO2 92 % 01/13/22 0846  Vitals shown include unvalidated device data.  Last Pain:  Vitals:   01/13/22 0709  TempSrc:   PainSc: 0-No pain         Complications: No notable events documented.

## 2022-01-13 NOTE — Op Note (Signed)
EXCISION OF RIGHT NIPPLE LESION  Procedure Note  Veronica Franklin 01/13/2022   Pre-op Diagnosis: RIGHT NIPPLE LESION     Post-op Diagnosis: same  Procedure(s): EXCISION OF RIGHT NIPPLE LESION ( 5 mm)  Surgeon(s): [name], MD  Anesthesia: Monitor Anesthesia Care  Staff:  Circulator: Silas Sacramento, RN; Ridgwell, Kallie Locks, RN Scrub Person: Angelena Form  Estimated Blood Loss: Minimal               Specimens: sent to path  Indications: This is an 81 year old female who presented dermatology for the lesion on her nipple.  She underwent a punch biopsy of this on the right nipple with the final pathology showing dilated ducts.  She underwent a follow-up ultrasound and mammogram which was unremarkable.  He was made to excise the nipple lesion for complete histologic evaluation to a malignancy  Procedure: The patient is brought to the operating room identified the correct patient.  She was placed upon the operating table and anesthesia was induced.  Her right breast was prepped and draped in usual sterile fashion.  Most of the remaining lesion at the end of the nipple with scarring from the previous biopsy.  I anesthetized skin with lidocaine and made elliptical incision and excised the nipple skin and underlying tissue with a scalpel.  This was then sent in several small pieces to pathology for evaluation.  I achieved hemostasis with the cautery.  I anesthetized the incision further with lidocaine.  I then closed the incision with interrupted 4-0 Monocryl sutures and Dermabond.  The patient tolerated procedure well.  All the counts were correct at the end of the procedure.  She was then taken in stable condition from the operating room to the recovery room.          [name]   Date: 01/13/2022  Time: 8:41 AM

## 2022-01-14 ENCOUNTER — Encounter (HOSPITAL_COMMUNITY): Payer: Self-pay | Admitting: Surgery

## 2022-01-15 LAB — SURGICAL PATHOLOGY

## 2022-01-27 ENCOUNTER — Encounter (HOSPITAL_BASED_OUTPATIENT_CLINIC_OR_DEPARTMENT_OTHER): Payer: Self-pay | Admitting: Obstetrics & Gynecology

## 2022-01-27 ENCOUNTER — Ambulatory Visit (HOSPITAL_BASED_OUTPATIENT_CLINIC_OR_DEPARTMENT_OTHER): Payer: Medicare PPO | Admitting: Obstetrics & Gynecology

## 2022-01-27 VITALS — BP 152/68 | HR 67 | Ht 64.5 in | Wt 148.6 lb

## 2022-01-27 DIAGNOSIS — R7309 Other abnormal glucose: Secondary | ICD-10-CM | POA: Diagnosis not present

## 2022-01-27 DIAGNOSIS — N8110 Cystocele, unspecified: Secondary | ICD-10-CM

## 2022-01-27 DIAGNOSIS — N649 Disorder of breast, unspecified: Secondary | ICD-10-CM | POA: Diagnosis not present

## 2022-01-27 DIAGNOSIS — N816 Rectocele: Secondary | ICD-10-CM

## 2022-01-27 LAB — HEMOGLOBIN A1C
Est. average glucose Bld gHb Est-mCnc: 105 mg/dL
Hgb A1c MFr Bld: 5.3 % (ref 4.8–5.6)

## 2022-01-27 NOTE — Progress Notes (Signed)
GYNECOLOGY  VISIT  CC:   follow up  HPI: 81 y.o. G3P3 Divorced White or Caucasian female here for follow up of breast lesion that was recently removed 01/13/2022 by Dr. Magnus Ivan.  Pathology showed no significant pathology.  Reviewed today.  Had mammogram 12/26/2021 prior to excision due to finding.  This was diagnostic on the right.  Will plan follow up screening later this year.  Will wait until there is complete healing on the right breast.    Last BMD 10/2020.  Has osteopenia but high risk for fracture.  We discussed this last year in August.  She declined treatment and is going to plan to repeat in 2024.    Denies vaginal bleeding.  Has pelvic prolapse.  Sometime has leaking at night when she wakes up and her bladder feel full.    Still has IBS issues that she has trouble at times figuring out what is the trigger for her GI symptoms.  Last colonoscopy was 2014.  Pt brought lab work with her.  Had in early July.  Glucose was mildly elevated.  Will draw hbA1C today.     Past Medical History:  Diagnosis Date   Complication of anesthesia    PONV   Diverticulosis    Fibromyalgia    GERD (gastroesophageal reflux disease)    Hyperlipidemia    Hypertension    IBS (irritable bowel syndrome)    Left fibular fracture 10/04/2013   MVP (mitral valve prolapse)    Patellar dislocation 10/04/2013   Left   Patellar fracture    Left   Pneumonia     MEDS:   Current Outpatient Medications on File Prior to Visit  Medication Sig Dispense Refill   acetaminophen (TYLENOL) 500 MG tablet Take 1,000 mg by mouth as needed for headache (body aches).     cetirizine (ZYRTEC) 10 MG tablet Take 10 mg by mouth at bedtime.     Cholecalciferol (VITAMIN D3) 2000 units TABS Take 2,000 Units by mouth at bedtime.     fluticasone (FLONASE) 50 MCG/ACT nasal spray Place 1 spray into both nostrils daily.     hydrochlorothiazide (HYDRODIURIL) 12.5 MG tablet Take 12.5 mg by mouth daily.     hydrocortisone cream 1 %  Apply 1 Application topically as needed (insect bites).     hyoscyamine (LEVBID) 0.375 MG 12 hr tablet Take 1 tablet by mouth 2 (two) times daily.     ipratropium (ATROVENT) 0.03 % nasal spray Place 1-2 sprays into both nostrils as needed (congestion).     montelukast (SINGULAIR) 10 MG tablet Take 10 mg by mouth daily.      OVER THE COUNTER MEDICATION Apply 1 Application topically as needed (insect bites). MelaGel     OVER THE COUNTER MEDICATION Take 1 tablet by mouth at bedtime. Plexus Pro-Bio     POTASSIUM GLUCONATE PO Take 99 mg by mouth in the morning and at bedtime.     propranolol (INDERAL) 40 MG tablet Take 40 mg by mouth 2 (two) times daily.     SALINE NA Place 2 sprays into the nose as needed (congestion).     sodium chloride (OCEAN) 0.65 % nasal spray Place 2 sprays into the nose as needed for congestion.     Tart Cherry (TART CHERRY ULTRA) 1200 MG CAPS Take 1,200 mg by mouth in the morning and at bedtime.     triamcinolone cream (KENALOG) 0.1 % Apply 1 Application topically as needed (itching).  1   TURMERIC PO Take 1,000  mg by mouth in the morning and at bedtime. 1000 mg     vitamin B-12 (CYANOCOBALAMIN) 1000 MCG tablet Take 1,000 mcg by mouth at bedtime.     vitamin C (ASCORBIC ACID) 500 MG tablet Take 500 mg by mouth at bedtime.     zinc gluconate 50 MG tablet Take 50 mg by mouth at bedtime.     fluticasone (FLOVENT DISKUS) 50 MCG/BLIST diskus inhaler Inhale 1 puff into the lungs 2 (two) times daily. (Patient not taking: Reported on 01/07/2022)     No current facility-administered medications on file prior to visit.    ALLERGIES: Levaquin [levofloxacin], Augmentin [amoxicillin-pot clavulanate], Azithromycin, Ciprofloxacin, Codeine, Demerol [meperidine], Provera [medroxyprogesterone acetate], Sulfa antibiotics, Tramadol hcl, and Percocet [oxycodone-acetaminophen]  SH:  divorced, non smoker  Review of Systems  Constitutional: Negative.   Genitourinary:  Positive for urgency (at  times).    PHYSICAL EXAMINATION:    BP (!) 152/68 (BP Location: Left Arm, Patient Position: Sitting, Cuff Size: Large)   Pulse 67   Ht 5' 4.5" (1.638 m) Comment: reported  Wt 148 lb 9.6 oz (67.4 kg)   LMP 12/05/1975   BMI 25.11 kg/m     General appearance: alert, cooperative and appears stated age Neck: no adenopathy, supple, symmetrical, trachea midline and thyroid normal to inspection and palpation CV:  Regular rate and rhythm Lungs:  clear to auscultation, no wheezes, rales or rhonchi, symmetric air entry Breasts: normal appearance, no masses or tenderness, scar at nipple is healing Abdomen: soft, non-tender; bowel sounds normal; no masses,  no organomegaly Lymph:  no inguinal LAD noted  Pelvic: External genitalia:  no lesions              Urethra:  normal appearing urethra with no masses, tenderness or lesions              Bartholins and Skenes: normal                 Vagina: normal appearing vagina with normal color and discharge, no lesions, anterior and posterior prolapse noted              Cervix: absent              Bimanual Exam:  Uterus:  uterus absent              Adnexa: no mass, fullness, tenderness              Rectovaginal: Yes.  .  Confirms.              Anus:  normal sphincter tone, no lesions  Chaperone, Ina Homes, CMA, was present for exam.  Assessment/Plan: 1. Nipple lesion -has follow up with Dr. Rayburn Ma next month.  Incision is healing well.  Advised pt to wait until incision is well healed until proceeding with mammogram.  2. 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 Rectocele - stable on exam  4. Elevated glucose - will check hemoglobin A1C today  Total time with pt and documentation: 25 minutes

## 2022-02-03 ENCOUNTER — Ambulatory Visit: Payer: Medicare PPO | Admitting: Podiatry

## 2022-02-03 DIAGNOSIS — M79675 Pain in left toe(s): Secondary | ICD-10-CM | POA: Diagnosis not present

## 2022-02-03 DIAGNOSIS — B351 Tinea unguium: Secondary | ICD-10-CM

## 2022-02-03 DIAGNOSIS — M79674 Pain in right toe(s): Secondary | ICD-10-CM

## 2022-02-03 DIAGNOSIS — Q828 Other specified congenital malformations of skin: Secondary | ICD-10-CM

## 2022-02-03 NOTE — Progress Notes (Signed)
Subjective: 81 y.o. returns the office today for painful, elongated, thickened toenails which she cannot trim herself and for calluses.  Denies any redness or drainage or any open lesions.  She has no new concerns today.  No fevers or chills.  PCP: [name], MD  Objective: AAO 3, NAD DP/PT pulses palpable, CRT less than 3 seconds Nails hypertrophic, dystrophic, elongated, brittle, discolored 10. There is tenderness overlying the nails 1-5 bilaterally. There is no surrounding erythema or drainage along the nail sites. There is hyperkeratotic lesion bilateral foot submetatarsal 5.  New lesions on the lateral left hallux IPJ and the medial 2nd toe. There is no underlying ulceration drainage or any signs of infection. Hammertoes present and a small scab is present on the right second toe without signs of infection noted. HAV left.  No pain with calf compression, swelling, warmth, erythema.  Assessment: Patient presents with symptomatic onychomycosis; hyperkeratotic lesions  Plan: -Treatment options including alternatives, risks, complications were discussed -Nails sharply debrided 10 without complication/bleeding.  Urea cream for toenails -Minimal hyperkeratotic lesion sharply x4 without any complications or bleeding as a courtesy, there is minimal tissue.  Recommend moisturizer and offloading. -Discussed daily foot inspection. If there are any changes, to call the office immediately.  -Follow-up in 3 months or sooner if any problems are to arise. In the meantime, encouraged to call the office with any questions, concerns, changes symptoms.  Ovid Curd, DPM

## 2022-02-19 NOTE — Progress Notes (Signed)
[name] [address] [place] [address] Phone: [phone] Subjective:   [name], am serving as a scribe for Dr. Antoine Primas.  I'm seeing this patient by the request  of:  [name], MD  CC: back pain follow up   [id number]  12/24/2021 Patient's back pain seems to be multifactorial.  Patient states it does not usually stop her from any activities.  Denies any association with bowel or bladder, or intermittent bleeding.  Patient states that the worst pain still seems to be more in the morning than anything else at the moment.  We discussed potential rotated and the mattress or going into getting new mattresses.  We discussed which activities to doing which ones to potentially avoid.  Continue the vitamin supplementations.  Would not want to make any significant changes otherwise.  Follow-up with me again in 3 months.  Total time reviewing patient's previous imaging and discussing with patient 33 minutes  Update 02/24/2022 KAHLIYAH DICK is a 81 y.o. female coming in with complaint of back pain. Patient states that her back and neck have been good since last visit.        Past Medical History:  Diagnosis Date   Complication of anesthesia    PONV   Diverticulosis    Fibromyalgia    GERD (gastroesophageal reflux disease)    Hyperlipidemia    Hypertension    IBS (irritable bowel syndrome)    Left fibular fracture 10/04/2013   MVP (mitral valve prolapse)    Patellar dislocation 10/04/2013   Left   Patellar fracture    Left   Pneumonia    Past Surgical History:  Procedure Laterality Date   APPENDECTOMY     BILATERAL SALPINGOOPHORECTOMY  07/10/1998   Endometrosis and LOA   BREAST BIOPSY Bilateral 09/2008   BREAST BIOPSY Right 1959   BREAST BIOPSY Right 1970's & 1981   CATARACT EXTRACTION Bilateral    CHOLECYSTECTOMY  02/2004   COLONOSCOPY     ear lanced  1944   EXCISION OF BREAST LESION Right 01/13/2022    Procedure: EXCISION OF RIGHT NIPPLE LESION;  Surgeon: [name], MD;  Location: [facility];  Service: General;  Laterality: Right;   HERNIA REPAIR     KNEE DISLOCATION SURGERY Left 2015   thigh lipoma Right    removed   TONSILLECTOMY     TOOTH EXTRACTION     UMBILICAL HERNIA REPAIR  07/10/1998   at same time of Ophorectomy   VAGINAL HYSTERECTOMY  1977   Social History   Socioeconomic History   Marital status: Divorced    Spouse name: Not on file   Number of children: 3   Years of education: Not on file   Highest education level: Not on file  Occupational History   Not on file  Tobacco Use   Smoking status: Never   Smokeless tobacco: Never  Vaping Use   Vaping Use: Never used  Substance and Sexual Activity   Alcohol use: No   Drug use: No   Sexual activity: Not Currently    Partners: Male  Other Topics Concern   Not on file  Social History Narrative   Not on file   Social Determinants of Health   Financial Resource Strain: Not on file  Food Insecurity: Not on file  Transportation Needs: Not on file  Physical Activity: Not on file  Stress: Not on file  Social Connections: Not on file   Allergies  Allergen Reactions   Levaquin [Levofloxacin] Other (See Comments)    Muscle pain  Tendonitis    Augmentin [Amoxicillin-Pot Clavulanate] Nausea Only   Azithromycin Diarrhea and Nausea And Vomiting   Ciprofloxacin Nausea And Vomiting    Per Dr - told not to take due to Levaquin reation    Codeine Nausea And Vomiting   Demerol [Meperidine] Nausea And Vomiting   Provera [Medroxyprogesterone Acetate]     unknown   Sulfa Antibiotics Nausea And Vomiting   Tramadol Hcl Nausea And Vomiting   Percocet [Oxycodone-Acetaminophen] Nausea And Vomiting   Family History  Problem Relation Age of Onset   Heart failure Mother    Ulcers Mother    Hypertension Mother    Pleurisy Father    Cancer Father    Cancer Maternal Aunt        uterine cancer   Cancer Paternal Aunt         lymphoma   Diabetes Maternal Grandmother    Diverticulitis Son    Diverticulitis Daughter      Current Outpatient Medications (Cardiovascular):    hydrochlorothiazide (HYDRODIURIL) 12.5 MG tablet, Take 12.5 mg by mouth daily.   propranolol (INDERAL) 40 MG tablet, Take 40 mg by mouth 2 (two) times daily.  Current Outpatient Medications (Respiratory):    cetirizine (ZYRTEC) 10 MG tablet, Take 10 mg by mouth at bedtime.   fluticasone (FLONASE) 50 MCG/ACT nasal spray, Place 1 spray into both nostrils daily.   fluticasone (FLOVENT DISKUS) 50 MCG/BLIST diskus inhaler, Inhale 1 puff into the lungs 2 (two) times daily.   ipratropium (ATROVENT) 0.03 % nasal spray, Place 1-2 sprays into both nostrils as needed (congestion).   montelukast (SINGULAIR) 10 MG tablet, Take 10 mg by mouth daily.    SALINE NA, Place 2 sprays into the nose as needed (congestion).   sodium chloride (OCEAN) 0.65 % nasal spray, Place 2 sprays into the nose as needed for congestion.  Current Outpatient Medications (Analgesics):    acetaminophen (TYLENOL) 500 MG tablet, Take 1,000 mg by mouth as needed for headache (body aches).  Current Outpatient Medications (Hematological):    vitamin B-12 (CYANOCOBALAMIN) 1000 MCG tablet, Take 1,000 mcg by mouth at bedtime.  Current Outpatient Medications (Other):    Cholecalciferol (VITAMIN D3) 2000 units TABS, Take 2,000 Units by mouth at bedtime.   hydrocortisone cream 1 %, Apply 1 Application topically as needed (insect bites).   hyoscyamine (LEVBID) 0.375 MG 12 hr tablet, Take 1 tablet by mouth 2 (two) times daily.   OVER THE COUNTER MEDICATION, Apply 1 Application topically as needed (insect bites). MelaGel   OVER THE COUNTER MEDICATION, Take 1 tablet by mouth at bedtime. Plexus Pro-Bio   POTASSIUM GLUCONATE PO, Take 99 mg by mouth in the morning and at bedtime.   Tart Cherry (TART CHERRY ULTRA) 1200 MG CAPS, Take 1,200 mg by mouth in the morning and at bedtime.    triamcinolone cream (KENALOG) 0.1 %, Apply 1 Application topically as needed (itching).   TURMERIC PO, Take 1,000 mg by mouth in the morning and at bedtime. 1000 mg   vitamin C (ASCORBIC ACID) 500 MG tablet, Take 500 mg by mouth at bedtime.   zinc gluconate 50 MG tablet, Take 50 mg by mouth at bedtime.   Reviewed prior external information including notes and imaging from  primary care provider As well as notes that were available from care everywhere and other healthcare systems.  Did review patient's most recent biopsy of the breast that was  showing no carcinoma.  Past medical history, social, surgical and family history all reviewed in electronic medical record.  No pertanent information unless stated regarding to the chief complaint.   Review of Systems:  No headache, visual changes, nausea, vomiting, diarrhea, constipation, dizziness, abdominal pain, skin rash, fevers, chills, night sweats, weight loss, swollen lymph nodes, body aches, joint swelling, chest pain, shortness of breath, mood changes. POSITIVE muscle aches  Objective  Blood pressure 128/72, pulse 76, height 5' 4.5" (1.638 m), weight 149 lb (67.6 kg), last menstrual period 12/05/1975, SpO2 97 %.   General: No apparent distress alert and oriented x3 mood and affect normal, dressed appropriately.  HEENT: Pupils equal, extraocular movements intact  Respiratory: Patient's speak in full sentences and does not appear short of breath  Cardiovascular: No lower extremity edema, non tender, no erythema  Back exam shows mild loss of lordosis but able to get in and out of a chair with no trouble.  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  Lateral tracking of the patella noted with crepitus mild antalgic gait    Impression and Recommendations:    The above documentation has been reviewed and is accurate and complete Judi Saa, DO

## 2022-02-24 ENCOUNTER — Ambulatory Visit: Payer: Medicare PPO | Admitting: Family Medicine

## 2022-02-24 DIAGNOSIS — M1711 Unilateral primary osteoarthritis, right knee: Secondary | ICD-10-CM | POA: Diagnosis not present

## 2022-02-24 NOTE — Assessment & Plan Note (Signed)
Arthritic changes of the knees bilaterally but seems to be doing well.  No significant worsening of back pain.  Changes and follow-up with me again in

## 2022-02-24 NOTE — Patient Instructions (Signed)
See me in 2-3 months Glad you are doing well

## 2022-04-07 ENCOUNTER — Ambulatory Visit: Payer: Medicare PPO | Admitting: Podiatry

## 2022-04-07 DIAGNOSIS — M79674 Pain in right toe(s): Secondary | ICD-10-CM

## 2022-04-07 DIAGNOSIS — M79675 Pain in left toe(s): Secondary | ICD-10-CM

## 2022-04-07 DIAGNOSIS — Q828 Other specified congenital malformations of skin: Secondary | ICD-10-CM | POA: Diagnosis not present

## 2022-04-07 DIAGNOSIS — B351 Tinea unguium: Secondary | ICD-10-CM

## 2022-04-14 NOTE — Progress Notes (Signed)
Subjective: 81 y.o. returns the office today for painful, elongated, thickened toenails which she cannot trim herself and for calluses.  Denies any redness or drainage or any open lesions.  She has no new concerns today.  No fevers or chills.  No recent injuries or changes.  PCP: Lawerance Cruel, MD  Objective: AAO 3, NAD DP/PT pulses palpable, CRT less than 3 seconds Nails hypertrophic, dystrophic, elongated, brittle, discolored 10. There is tenderness overlying the nails 1-5 bilaterally. There is no surrounding erythema or drainage along the nail sites. There is hyperkeratotic lesion bilateral foot submetatarsal 5.  New lesions on the lateral left hallux IPJ and the medial 2nd toe. There is no underlying ulceration drainage or any signs of infection. Hammertoes present and a small scab is present on the right second toe without signs of infection noted. HAV left.  No pain with calf compression, swelling, warmth, erythema.  Assessment: Patient presents with symptomatic onychomycosis; hyperkeratotic lesions  Plan: -Treatment options including alternatives, risks, complications were discussed -Nails sharply debrided 10 without complication/bleeding.  Urea cream for toenails -Minimal hyperkeratotic lesion sharply x4 without any complications or bleeding as a courtesy, there is minimal tissue.  Continue with moisturizer and offloading. -Discussed daily foot inspection. If there are any changes, to call the office immediately.  -Follow-up in 3 months or sooner if any problems are to arise. In the meantime, encouraged to call the office with any questions, concerns, changes symptoms.  Celesta Gentile, DPM

## 2022-04-16 NOTE — Progress Notes (Deleted)
[place] Phone: [phone] Subjective:    I'm seeing this patient by the request  of:  Lawerance Cruel, MD  CC:   [id number]  02/24/2022 Arthritic changes of the knees bilaterally but seems to be doing well.  No significant worsening of back pain.  Changes and follow-up with me again in  Update 04/21/2022 Veronica Franklin is a 81 y.o. female coming in with complaint of R knee pain. Patient states        Past Medical History:  Diagnosis Date   Complication of anesthesia    PONV   Diverticulosis    Fibromyalgia    GERD (gastroesophageal reflux disease)    Hyperlipidemia    Hypertension    IBS (irritable bowel syndrome)    Left fibular fracture 10/04/2013   MVP (mitral valve prolapse)    Patellar dislocation 10/04/2013   Left   Patellar fracture    Left   Pneumonia    Past Surgical History:  Procedure Laterality Date   APPENDECTOMY     BILATERAL SALPINGOOPHORECTOMY  07/10/1998   Endometrosis and LOA   BREAST BIOPSY Bilateral 09/2008   BREAST BIOPSY Right 1959   BREAST BIOPSY Right 1970's & 1981   CATARACT EXTRACTION Bilateral    CHOLECYSTECTOMY  02/2004   COLONOSCOPY     ear lanced  1944   EXCISION OF BREAST LESION Right 01/13/2022   Procedure: EXCISION OF RIGHT NIPPLE LESION;  Surgeon: Coralie Keens, MD;  Location: [facility];  Service: General;  Laterality: Right;   HERNIA REPAIR     KNEE DISLOCATION SURGERY Left 2015   thigh lipoma Right    removed   TONSILLECTOMY     TOOTH EXTRACTION     UMBILICAL HERNIA REPAIR  07/10/1998   at same time of Ophorectomy   VAGINAL HYSTERECTOMY  1977   Social History   Socioeconomic History   Marital status: Divorced    Spouse name: Not on file   Number of children: 3   Years of education: Not on file   Highest education level: Not on file  Occupational History   Not on file  Tobacco Use   Smoking status: Never   Smokeless tobacco:  Never  Vaping Use   Vaping Use: Never used  Substance and Sexual Activity   Alcohol use: No   Drug use: No   Sexual activity: Not Currently    Partners: Male  Other Topics Concern   Not on file  Social History Narrative   Not on file   Social Determinants of Health   Financial Resource Strain: Not on file  Food Insecurity: Not on file  Transportation Needs: Not on file  Physical Activity: Not on file  Stress: Not on file  Social Connections: Not on file   Allergies  Allergen Reactions   Levaquin [Levofloxacin] Other (See Comments)    Muscle pain  Tendonitis    Augmentin [Amoxicillin-Pot Clavulanate] Nausea Only   Azithromycin Diarrhea and Nausea And Vomiting   Ciprofloxacin Nausea And Vomiting    Per Dr - told not to take due to Levaquin reation    Codeine Nausea And Vomiting   Demerol [Meperidine] Nausea And Vomiting   Provera [Medroxyprogesterone Acetate]     unknown   Sulfa Antibiotics Nausea And Vomiting   Tramadol Hcl Nausea And Vomiting   Percocet [Oxycodone-Acetaminophen] Nausea And Vomiting   Family History  Problem Relation Age of Onset   Heart failure  Mother    Ulcers Mother    Hypertension Mother    Pleurisy Father    Cancer Father    Cancer Maternal Aunt        uterine cancer   Cancer Paternal Aunt        lymphoma   Diabetes Maternal Grandmother    Diverticulitis Son    Diverticulitis Daughter      Current Outpatient Medications (Cardiovascular):    hydrochlorothiazide (HYDRODIURIL) 12.5 MG tablet, Take 12.5 mg by mouth daily.   propranolol (INDERAL) 40 MG tablet, Take 40 mg by mouth 2 (two) times daily.  Current Outpatient Medications (Respiratory):    cetirizine (ZYRTEC) 10 MG tablet, Take 10 mg by mouth at bedtime.   fluticasone (FLONASE) 50 MCG/ACT nasal spray, Place 1 spray into both nostrils daily.   fluticasone (FLOVENT DISKUS) 50 MCG/BLIST diskus inhaler, Inhale 1 puff into the lungs 2 (two) times daily.   ipratropium (ATROVENT) 0.03  % nasal spray, Place 1-2 sprays into both nostrils as needed (congestion).   montelukast (SINGULAIR) 10 MG tablet, Take 10 mg by mouth daily.    SALINE NA, Place 2 sprays into the nose as needed (congestion).   sodium chloride (OCEAN) 0.65 % nasal spray, Place 2 sprays into the nose as needed for congestion.  Current Outpatient Medications (Analgesics):    acetaminophen (TYLENOL) 500 MG tablet, Take 1,000 mg by mouth as needed for headache (body aches).  Current Outpatient Medications (Hematological):    vitamin B-12 (CYANOCOBALAMIN) 1000 MCG tablet, Take 1,000 mcg by mouth at bedtime.  Current Outpatient Medications (Other):    Cholecalciferol (VITAMIN D3) 2000 units TABS, Take 2,000 Units by mouth at bedtime.   hydrocortisone cream 1 %, Apply 1 Application topically as needed (insect bites).   hyoscyamine (LEVBID) 0.375 MG 12 hr tablet, Take 1 tablet by mouth 2 (two) times daily.   OVER THE COUNTER MEDICATION, Apply 1 Application topically as needed (insect bites). MelaGel   OVER THE COUNTER MEDICATION, Take 1 tablet by mouth at bedtime. Plexus Pro-Bio   POTASSIUM GLUCONATE PO, Take 99 mg by mouth in the morning and at bedtime.   Tart Cherry (TART CHERRY ULTRA) 1200 MG CAPS, Take 1,200 mg by mouth in the morning and at bedtime.   triamcinolone cream (KENALOG) 0.1 %, Apply 1 Application topically as needed (itching).   TURMERIC PO, Take 1,000 mg by mouth in the morning and at bedtime. 1000 mg   vitamin C (ASCORBIC ACID) 500 MG tablet, Take 500 mg by mouth at bedtime.   zinc gluconate 50 MG tablet, Take 50 mg by mouth at bedtime.   Reviewed prior external information including notes and imaging from  primary care provider As well as notes that were available from care everywhere and other healthcare systems.  Past medical history, social, surgical and family history all reviewed in electronic medical record.  No pertanent information unless stated regarding to the chief complaint.    Review of Systems:  No headache, visual changes, nausea, vomiting, diarrhea, constipation, dizziness, abdominal pain, skin rash, fevers, chills, night sweats, weight loss, swollen lymph nodes, body aches, joint swelling, chest pain, shortness of breath, mood changes. POSITIVE muscle aches  Objective  Last menstrual period 12/05/1975.   General: No apparent distress alert and oriented x3 mood and affect normal, dressed appropriately.  HEENT: Pupils equal, extraocular movements intact  Respiratory: Patient's speak in full sentences and does not appear short of breath  Cardiovascular: No lower extremity edema, non tender, no erythema  Impression and Recommendations:

## 2022-04-21 ENCOUNTER — Ambulatory Visit: Payer: Medicare PPO | Admitting: Family Medicine

## 2022-05-11 DIAGNOSIS — L821 Other seborrheic keratosis: Secondary | ICD-10-CM | POA: Diagnosis not present

## 2022-05-11 DIAGNOSIS — Z85828 Personal history of other malignant neoplasm of skin: Secondary | ICD-10-CM | POA: Diagnosis not present

## 2022-05-11 DIAGNOSIS — L820 Inflamed seborrheic keratosis: Secondary | ICD-10-CM | POA: Diagnosis not present

## 2022-05-11 DIAGNOSIS — L814 Other melanin hyperpigmentation: Secondary | ICD-10-CM | POA: Diagnosis not present

## 2022-05-11 DIAGNOSIS — L570 Actinic keratosis: Secondary | ICD-10-CM | POA: Diagnosis not present

## 2022-05-11 DIAGNOSIS — D1801 Hemangioma of skin and subcutaneous tissue: Secondary | ICD-10-CM | POA: Diagnosis not present

## 2022-06-04 DIAGNOSIS — R0981 Nasal congestion: Secondary | ICD-10-CM | POA: Diagnosis not present

## 2022-06-04 DIAGNOSIS — J0140 Acute pansinusitis, unspecified: Secondary | ICD-10-CM | POA: Diagnosis not present

## 2022-06-04 DIAGNOSIS — Z6825 Body mass index (BMI) 25.0-25.9, adult: Secondary | ICD-10-CM | POA: Diagnosis not present

## 2022-06-11 NOTE — Progress Notes (Unsigned)
[name] [address] [place] [address] Phone: [phone] Subjective:   [name], am serving as a scribe for Dr. Antoine Primas.  I'm seeing this patient by the request  of:  [name], MD  CC: right knee pain   [id number]  02/24/2022 Arthritic changes of the knees bilaterally but seems to be doing well. No significant worsening of back pain. Changes and follow-up with me again in   Update 06/16/2022 Veronica Franklin is a 81 y.o. female coming in with complaint of R knee pain. Patient states that she is doing well, was able to get up out of the chair with ease. Patient would like to see if you can help her with her left great toe. She does see a podiatrist but this has been given her a "FIT" and would like you to look at it if you can.       Past Medical History:  Diagnosis Date   Complication of anesthesia    PONV   Diverticulosis    Fibromyalgia    GERD (gastroesophageal reflux disease)    Hyperlipidemia    Hypertension    IBS (irritable bowel syndrome)    Left fibular fracture 10/04/2013   MVP (mitral valve prolapse)    Patellar dislocation 10/04/2013   Left   Patellar fracture    Left   Pneumonia    Past Surgical History:  Procedure Laterality Date   APPENDECTOMY     BILATERAL SALPINGOOPHORECTOMY  07/10/1998   Endometrosis and LOA   BREAST BIOPSY Bilateral 09/2008   BREAST BIOPSY Right 1959   BREAST BIOPSY Right 1970's & 1981   CATARACT EXTRACTION Bilateral    CHOLECYSTECTOMY  02/2004   COLONOSCOPY     ear lanced  1944   EXCISION OF BREAST LESION Right 01/13/2022   Procedure: EXCISION OF RIGHT NIPPLE LESION;  Surgeon: [name], MD;  Location: [facility];  Service: General;  Laterality: Right;   HERNIA REPAIR     KNEE DISLOCATION SURGERY Left 2015   thigh lipoma Right    removed   TONSILLECTOMY     TOOTH EXTRACTION     UMBILICAL HERNIA REPAIR  07/10/1998   at same time of Ophorectomy    VAGINAL HYSTERECTOMY  1977   Social History   Socioeconomic History   Marital status: Divorced    Spouse name: Not on file   Number of children: 3   Years of education: Not on file   Highest education level: Not on file  Occupational History   Not on file  Tobacco Use   Smoking status: Never   Smokeless tobacco: Never  Vaping Use   Vaping Use: Never used  Substance and Sexual Activity   Alcohol use: No   Drug use: No   Sexual activity: Not Currently    Partners: Male  Other Topics Concern   Not on file  Social History Narrative   Not on file   Social Determinants of Health   Financial Resource Strain: Not on file  Food Insecurity: Not on file  Transportation Needs: Not on file  Physical Activity: Not on file  Stress: Not on file  Social Connections: Not on file   Allergies  Allergen Reactions   Levaquin [Levofloxacin] Other (See Comments)    Muscle pain  Tendonitis    Augmentin [Amoxicillin-Pot Clavulanate] Nausea Only   Azithromycin Diarrhea and Nausea And Vomiting   Ciprofloxacin Nausea And Vomiting    Per Dr -  told not to take due to Levaquin reation    Codeine Nausea And Vomiting   Demerol [Meperidine] Nausea And Vomiting   Provera [Medroxyprogesterone Acetate]     unknown   Sulfa Antibiotics Nausea And Vomiting   Tramadol Hcl Nausea And Vomiting   Percocet [Oxycodone-Acetaminophen] Nausea And Vomiting   Family History  Problem Relation Age of Onset   Heart failure Mother    Ulcers Mother    Hypertension Mother    Pleurisy Father    Cancer Father    Cancer Maternal Aunt        uterine cancer   Cancer Paternal Aunt        lymphoma   Diabetes Maternal Grandmother    Diverticulitis Son    Diverticulitis Daughter      Current Outpatient Medications (Cardiovascular):    hydrochlorothiazide (HYDRODIURIL) 12.5 MG tablet, Take 12.5 mg by mouth daily.   propranolol (INDERAL) 40 MG tablet, Take 40 mg by mouth 2 (two) times daily.  Current Outpatient  Medications (Respiratory):    cetirizine (ZYRTEC) 10 MG tablet, Take 10 mg by mouth at bedtime.   fluticasone (FLONASE) 50 MCG/ACT nasal spray, Place 1 spray into both nostrils daily.   fluticasone (FLOVENT DISKUS) 50 MCG/BLIST diskus inhaler, Inhale 1 puff into the lungs 2 (two) times daily.   ipratropium (ATROVENT) 0.03 % nasal spray, Place 1-2 sprays into both nostrils as needed (congestion).   montelukast (SINGULAIR) 10 MG tablet, Take 10 mg by mouth daily.    SALINE NA, Place 2 sprays into the nose as needed (congestion).   sodium chloride (OCEAN) 0.65 % nasal spray, Place 2 sprays into the nose as needed for congestion.  Current Outpatient Medications (Analgesics):    acetaminophen (TYLENOL) 500 MG tablet, Take 1,000 mg by mouth as needed for headache (body aches).  Current Outpatient Medications (Hematological):    vitamin B-12 (CYANOCOBALAMIN) 1000 MCG tablet, Take 1,000 mcg by mouth at bedtime.  Current Outpatient Medications (Other):    Cholecalciferol (VITAMIN D3) 2000 units TABS, Take 2,000 Units by mouth at bedtime.   hydrocortisone cream 1 %, Apply 1 Application topically as needed (insect bites).   hyoscyamine (LEVBID) 0.375 MG 12 hr tablet, Take 1 tablet by mouth 2 (two) times daily.   OVER THE COUNTER MEDICATION, Apply 1 Application topically as needed (insect bites). MelaGel   OVER THE COUNTER MEDICATION, Take 1 tablet by mouth at bedtime. Plexus Pro-Bio   POTASSIUM GLUCONATE PO, Take 99 mg by mouth in the morning and at bedtime.   Tart Cherry (TART CHERRY ULTRA) 1200 MG CAPS, Take 1,200 mg by mouth in the morning and at bedtime.   triamcinolone cream (KENALOG) 0.1 %, Apply 1 Application topically as needed (itching).   TURMERIC PO, Take 1,000 mg by mouth in the morning and at bedtime. 1000 mg   vitamin C (ASCORBIC ACID) 500 MG tablet, Take 500 mg by mouth at bedtime.   zinc gluconate 50 MG tablet, Take 50 mg by mouth at bedtime.   Reviewed prior external information  including notes and imaging from  primary care provider As well as notes that were available from care everywhere and other healthcare systems.  Past medical history, social, surgical and family history all reviewed in electronic medical record.  No pertanent information unless stated regarding to the chief complaint.   Review of Systems:  No headache, visual changes, nausea, vomiting, diarrhea, constipation, dizziness, abdominal pain, skin rash, fevers, chills, night sweats, weight loss, swollen lymph nodes, body aches,  joint swelling, chest pain, shortness of breath, mood changes. POSITIVE muscle aches  Objective  Last menstrual period 12/05/1975.   General: No apparent distress alert and oriented x3 mood and affect normal, dressed appropriately.  HEENT: Pupils equal, extraocular movements intact  Respiratory: Patient's speak in full sentences and does not appear short of breath  Cardiovascular: No lower extremity edema, non tender, no erythema  Right knee exam shows     Impression and Recommendations:    The above documentation has been reviewed and is accurate and complete Judi Saa, DO

## 2022-06-16 ENCOUNTER — Ambulatory Visit: Payer: Medicare PPO | Admitting: Family Medicine

## 2022-06-16 VITALS — BP 126/82 | HR 67 | Ht 64.5 in | Wt 148.0 lb

## 2022-06-16 DIAGNOSIS — M1711 Unilateral primary osteoarthritis, right knee: Secondary | ICD-10-CM | POA: Diagnosis not present

## 2022-06-16 DIAGNOSIS — M216X9 Other acquired deformities of unspecified foot: Secondary | ICD-10-CM | POA: Diagnosis not present

## 2022-06-16 NOTE — Patient Instructions (Signed)
Spenco Total Support Original Wart remover cream with bandaid for one week, use daily Use pumice stone Can double up turmeric-watch for bruising See me in 2-3 months

## 2022-06-17 DIAGNOSIS — M216X9 Other acquired deformities of unspecified foot: Secondary | ICD-10-CM | POA: Insufficient documentation

## 2022-06-17 NOTE — Assessment & Plan Note (Signed)
Breakdown of the transverse arch bilaterally causing more of a callus formation.  Discussed with patient about icing regimen and home exercises, which activities to do and which ones to avoid.  Increase activity slowly over the course the next several weeks.  We discussed wart remover cream to soften the calluses and then removal as well.  We discussed over-the-counter orthotics that I think will be beneficial.  Follow-up with me again in 2 months

## 2022-06-17 NOTE — Assessment & Plan Note (Signed)
Needed seems to be doing extremely well at the moment.  Discussed with patient about icing regimen and home exercises, discussed which activities to do and which ones to avoid.  Increasing activity slowly over the course the next several weeks.  Discussed icing regimen.  Follow-up again in 6 to 8 weeks

## 2022-07-09 ENCOUNTER — Ambulatory Visit: Payer: Medicare PPO | Admitting: Podiatry

## 2022-07-24 ENCOUNTER — Other Ambulatory Visit: Payer: Self-pay | Admitting: Obstetrics & Gynecology

## 2022-07-24 ENCOUNTER — Ambulatory Visit: Payer: Medicare PPO | Admitting: Podiatry

## 2022-07-24 ENCOUNTER — Ambulatory Visit
Admission: RE | Admit: 2022-07-24 | Discharge: 2022-07-24 | Disposition: A | Discharge status: Home / Self Care | Payer: Medicare PPO | Source: Ambulatory Visit | Attending: Obstetrics & Gynecology | Admitting: Obstetrics & Gynecology

## 2022-07-24 DIAGNOSIS — Q828 Other specified congenital malformations of skin: Secondary | ICD-10-CM

## 2022-07-24 DIAGNOSIS — B351 Tinea unguium: Secondary | ICD-10-CM | POA: Diagnosis not present

## 2022-07-24 DIAGNOSIS — M79675 Pain in left toe(s): Secondary | ICD-10-CM | POA: Diagnosis not present

## 2022-07-24 DIAGNOSIS — Z1231 Encounter for screening mammogram for malignant neoplasm of breast: Secondary | ICD-10-CM

## 2022-07-24 DIAGNOSIS — M79674 Pain in right toe(s): Secondary | ICD-10-CM | POA: Diagnosis not present

## 2022-07-24 NOTE — Patient Instructions (Signed)
Look at getting an insert with metatarsal support

## 2022-07-27 NOTE — Progress Notes (Signed)
Subjective: Chief Complaint  Patient presents with   routine foot care    82 y.o. returns the office today for painful, elongated, thickened toenails which she cannot trim herself and for calluses.  Denies any redness or drainage or any open lesions.  She also is seeing orthopedics and recommended I have been sent.  She has no other concerns today.  PCP: Lawerance Cruel, MD  Objective: AAO 3, NAD DP/PT pulses palpable, CRT less than 3 seconds Nails hypertrophic, dystrophic, elongated, brittle, discolored 10. There is tenderness overlying the nails 1-5 bilaterally. There is no surrounding erythema or drainage along the nail sites. There is hyperkeratotic lesion bilateral foot submetatarsal 5.  New lesions on the lateral left hallux IPJ and the medial 2nd toe. There is no underlying ulceration drainage or any signs of infection. Hammertoes present as well as bunion. No pain with calf compression, swelling, warmth, erythema.  Assessment: Patient presents with symptomatic onychomycosis; hyperkeratotic lesions  Plan: -Treatment options including alternatives, risks, complications were discussed -Nails sharply debrided 10 without complication/bleeding.  Urea cream for toenails -Minimal hyperkeratotic lesion sharply x4 without any complications or bleeding as a courtesy, there is minimal tissue.  -Continue with moisturizer and offloading.  We discussed utilizing a insert with metatarsal support.  She is USING over-the-counter insert.  If needed consider custom. -Discussed daily foot inspection. If there are any changes, to call the office immediately.  -Follow-up in 3 months or sooner if any problems are to arise. In the meantime, encouraged to call the office with any questions, concerns, changes symptoms.  Celesta Gentile, DPM

## 2022-08-07 ENCOUNTER — Other Ambulatory Visit: Payer: Self-pay | Admitting: Obstetrics & Gynecology

## 2022-08-07 DIAGNOSIS — N63 Unspecified lump in unspecified breast: Secondary | ICD-10-CM

## 2022-08-17 NOTE — Progress Notes (Unsigned)
[name] [place] Phone: [phone] Subjective:   Fontaine No, am serving as a scribe for Dr. Hulan Saas.  I'm seeing this patient by the request  of:  Lawerance Cruel, MD  CC: Foot pain follow-up  [id number]  06/16/2022 Breakdown of the transverse arch bilaterally causing more of a callus formation.  Discussed with patient about icing regimen and home exercises, which activities to do and which ones to avoid.  Increase activity slowly over the course the next several weeks.  We discussed wart remover cream to soften the calluses and then removal as well.  We discussed over-the-counter orthotics that I think will be beneficial.  Follow-up with me again in 2 months     Needed seems to be doing extremely well at the moment. Discussed with patient about icing regimen and home exercises, discussed which activities to do and which ones to avoid. Increasing activity slowly over the course the next several weeks. Discussed icing regimen. Follow-up again in 6 to 8 weeks   Update 08/18/2022 Veronica Franklin is a 82 y.o. female coming in with complaint of B foot and R knee pain. Patient states that wart medicine was helpful. Got [employer] Total Support Orthotics since Sunday.   R knee has not been bothering her since last visit.         Past Medical History:  Diagnosis Date   Complication of anesthesia    PONV   Diverticulosis    Fibromyalgia    GERD (gastroesophageal reflux disease)    Hyperlipidemia    Hypertension    IBS (irritable bowel syndrome)    Left fibular fracture 10/04/2013   MVP (mitral valve prolapse)    Patellar dislocation 10/04/2013   Left   Patellar fracture    Left   Pneumonia    Past Surgical History:  Procedure Laterality Date   APPENDECTOMY     BILATERAL SALPINGOOPHORECTOMY  07/10/1998   Endometrosis and LOA   BREAST BIOPSY Bilateral 09/2008   BREAST BIOPSY Right 1959   BREAST  BIOPSY Right 1970's & 1981   CATARACT EXTRACTION Bilateral    CHOLECYSTECTOMY  02/2004   COLONOSCOPY     ear lanced  1944   EXCISION OF BREAST LESION Right 01/13/2022   Procedure: EXCISION OF RIGHT NIPPLE LESION;  Surgeon: Coralie Keens, MD;  Location: [facility];  Service: General;  Laterality: Right;   HERNIA REPAIR     KNEE DISLOCATION SURGERY Left 2015   thigh lipoma Right    removed   TONSILLECTOMY     TOOTH EXTRACTION     UMBILICAL HERNIA REPAIR  07/10/1998   at same time of Ophorectomy   VAGINAL HYSTERECTOMY  1977   Social History   Socioeconomic History   Marital status: Divorced    Spouse name: Not on file   Number of children: 3   Years of education: Not on file   Highest education level: Not on file  Occupational History   Not on file  Tobacco Use   Smoking status: Never   Smokeless tobacco: Never  Vaping Use   Vaping Use: Never used  Substance and Sexual Activity   Alcohol use: No   Drug use: No   Sexual activity: Not Currently    Partners: Male  Other Topics Concern   Not on file  Social History Narrative   Not on file   Social Determinants of Health   Financial Resource Strain: Not  on file  Food Insecurity: Not on file  Transportation Needs: Not on file  Physical Activity: Not on file  Stress: Not on file  Social Connections: Not on file   Allergies  Allergen Reactions   Levaquin [Levofloxacin] Other (See Comments)    Muscle pain  Tendonitis    Augmentin [Amoxicillin-Pot Clavulanate] Nausea Only   Azithromycin Diarrhea and Nausea And Vomiting   Ciprofloxacin Nausea And Vomiting    Per Dr - told not to take due to Levaquin reation    Codeine Nausea And Vomiting   Demerol [Meperidine] Nausea And Vomiting   Provera [Medroxyprogesterone Acetate]     unknown   Sulfa Antibiotics Nausea And Vomiting   Tramadol Hcl Nausea And Vomiting   Percocet [Oxycodone-Acetaminophen] Nausea And Vomiting   Family History  Problem Relation Age of Onset    Heart failure Mother    Ulcers Mother    Hypertension Mother    Pleurisy Father    Cancer Father    Cancer Maternal Aunt        uterine cancer   Cancer Paternal Aunt        lymphoma   Diabetes Maternal Grandmother    Diverticulitis Son    Diverticulitis Daughter      Current Outpatient Medications (Cardiovascular):    hydrochlorothiazide (HYDRODIURIL) 12.5 MG tablet, Take 12.5 mg by mouth daily.   propranolol (INDERAL) 40 MG tablet, Take 40 mg by mouth 2 (two) times daily.  Current Outpatient Medications (Respiratory):    cetirizine (ZYRTEC) 10 MG tablet, Take 10 mg by mouth at bedtime.   fluticasone (FLONASE) 50 MCG/ACT nasal spray, Place 1 spray into both nostrils daily.   fluticasone (FLOVENT DISKUS) 50 MCG/BLIST diskus inhaler, Inhale 1 puff into the lungs 2 (two) times daily.   ipratropium (ATROVENT) 0.03 % nasal spray, Place 1-2 sprays into both nostrils as needed (congestion).   montelukast (SINGULAIR) 10 MG tablet, Take 10 mg by mouth daily.    SALINE NA, Place 2 sprays into the nose as needed (congestion).   sodium chloride (OCEAN) 0.65 % nasal spray, Place 2 sprays into the nose as needed for congestion.  Current Outpatient Medications (Analgesics):    acetaminophen (TYLENOL) 500 MG tablet, Take 1,000 mg by mouth as needed for headache (body aches).  Current Outpatient Medications (Hematological):    vitamin B-12 (CYANOCOBALAMIN) 1000 MCG tablet, Take 1,000 mcg by mouth at bedtime.  Current Outpatient Medications (Other):    Cholecalciferol (VITAMIN D3) 2000 units TABS, Take 2,000 Units by mouth at bedtime.   hydrocortisone cream 1 %, Apply 1 Application topically as needed (insect bites).   hyoscyamine (LEVBID) 0.375 MG 12 hr tablet, Take 1 tablet by mouth 2 (two) times daily.   OVER THE COUNTER MEDICATION, Apply 1 Application topically as needed (insect bites). MelaGel   OVER THE COUNTER MEDICATION, Take 1 tablet by mouth at bedtime. Plexus Pro-Bio   POTASSIUM  GLUCONATE PO, Take 99 mg by mouth in the morning and at bedtime.   Tart Cherry (TART CHERRY ULTRA) 1200 MG CAPS, Take 1,200 mg by mouth in the morning and at bedtime.   triamcinolone cream (KENALOG) 0.1 %, Apply 1 Application topically as needed (itching).   TURMERIC PO, Take 1,000 mg by mouth in the morning and at bedtime. 1000 mg   vitamin C (ASCORBIC ACID) 500 MG tablet, Take 500 mg by mouth at bedtime.   zinc gluconate 50 MG tablet, Take 50 mg by mouth at bedtime.     Review of  Systems:  No headache, visual changes, nausea, vomiting, diarrhea, constipation, dizziness, abdominal pain, skin rash, fevers, chills, night sweats, weight loss, swollen lymph nodes, body aches, joint swelling, chest pain, shortness of breath, mood changes. POSITIVE muscle aches  Objective  Blood pressure 110/72, pulse 70, height 5' 4.5" (1.638 m), weight 149 lb (67.6 kg), last menstrual period 12/05/1975, SpO2 96 %.   General: No apparent distress alert and oriented x3 mood and affect normal, dressed appropriately.  HEENT: Pupils equal, extraocular movements intact  Respiratory: Patient's speak in full sentences and does not appear short of breath  Cardiovascular: No lower extremity edema, non tender, no erythema  Right knee does have some mild lateral tracking of the patella noted.  Mild tenderness to palpation noted.  Nothing that is severe. Low back exam does seem to be doing relatively well at the moment.  Sitting comfortably in the chair.  Foot exam still shows the breakdown of the transverse arch noted.  Patient noted does have improvement in the callus formation that patient was having previously.   Impression and Recommendations:    The above documentation has been reviewed and is accurate and complete Lyndal Pulley, DO

## 2022-08-18 ENCOUNTER — Ambulatory Visit: Payer: Medicare PPO | Admitting: Family Medicine

## 2022-08-18 VITALS — BP 110/72 | HR 70 | Ht 64.5 in | Wt 149.0 lb

## 2022-08-18 DIAGNOSIS — M1711 Unilateral primary osteoarthritis, right knee: Secondary | ICD-10-CM | POA: Diagnosis not present

## 2022-08-18 DIAGNOSIS — M216X9 Other acquired deformities of unspecified foot: Secondary | ICD-10-CM | POA: Diagnosis not present

## 2022-08-18 NOTE — Assessment & Plan Note (Signed)
Patient doing much better with the orthotics at this time.  Discussed icing regimen and home exercises otherwise.  Continue to wear the over-the-counter orthotics and we discussed other issues.  Follow-up again in 3 to 4 months

## 2022-08-18 NOTE — Patient Instructions (Signed)
Veronica Franklin, [place] See me again in 3 months

## 2022-08-18 NOTE — Assessment & Plan Note (Signed)
Stable at the moment.  No change in management, worsening pain will consider injection but I do think patient will do well.

## 2022-10-13 DIAGNOSIS — H16223 Keratoconjunctivitis sicca, not specified as Sjogren's, bilateral: Secondary | ICD-10-CM | POA: Diagnosis not present

## 2022-10-15 ENCOUNTER — Ambulatory Visit: Admission: RE | Admit: 2022-10-15 | Payer: Medicare PPO | Source: Ambulatory Visit

## 2022-10-15 ENCOUNTER — Ambulatory Visit
Admission: RE | Admit: 2022-10-15 | Discharge: 2022-10-15 | Disposition: A | Discharge status: Home / Self Care | Payer: Medicare PPO | Source: Ambulatory Visit | Attending: Obstetrics & Gynecology | Admitting: Obstetrics & Gynecology

## 2022-10-15 DIAGNOSIS — R928 Other abnormal and inconclusive findings on diagnostic imaging of breast: Secondary | ICD-10-CM | POA: Diagnosis not present

## 2022-10-15 DIAGNOSIS — N63 Unspecified lump in unspecified breast: Secondary | ICD-10-CM

## 2022-10-20 DIAGNOSIS — I10 Essential (primary) hypertension: Secondary | ICD-10-CM | POA: Diagnosis not present

## 2022-10-20 DIAGNOSIS — E781 Pure hyperglyceridemia: Secondary | ICD-10-CM | POA: Diagnosis not present

## 2022-10-27 DIAGNOSIS — Z6825 Body mass index (BMI) 25.0-25.9, adult: Secondary | ICD-10-CM | POA: Diagnosis not present

## 2022-10-27 DIAGNOSIS — E781 Pure hyperglyceridemia: Secondary | ICD-10-CM | POA: Diagnosis not present

## 2022-10-27 DIAGNOSIS — I10 Essential (primary) hypertension: Secondary | ICD-10-CM | POA: Diagnosis not present

## 2022-10-27 DIAGNOSIS — J309 Allergic rhinitis, unspecified: Secondary | ICD-10-CM | POA: Diagnosis not present

## 2022-10-27 DIAGNOSIS — R7301 Impaired fasting glucose: Secondary | ICD-10-CM | POA: Diagnosis not present

## 2022-10-27 DIAGNOSIS — Z0000 Encounter for general adult medical examination without abnormal findings: Secondary | ICD-10-CM | POA: Diagnosis not present

## 2022-10-27 DIAGNOSIS — K589 Irritable bowel syndrome without diarrhea: Secondary | ICD-10-CM | POA: Diagnosis not present

## 2022-10-27 DIAGNOSIS — I700 Atherosclerosis of aorta: Secondary | ICD-10-CM | POA: Diagnosis not present

## 2022-10-30 ENCOUNTER — Ambulatory Visit (INDEPENDENT_AMBULATORY_CARE_PROVIDER_SITE_OTHER): Payer: Medicare PPO

## 2022-10-30 ENCOUNTER — Ambulatory Visit: Payer: Medicare PPO | Admitting: Podiatry

## 2022-10-30 ENCOUNTER — Encounter: Payer: Self-pay | Admitting: Podiatry

## 2022-10-30 DIAGNOSIS — K5730 Diverticulosis of large intestine without perforation or abscess without bleeding: Secondary | ICD-10-CM | POA: Insufficient documentation

## 2022-10-30 DIAGNOSIS — R140 Abdominal distension (gaseous): Secondary | ICD-10-CM | POA: Insufficient documentation

## 2022-10-30 DIAGNOSIS — L290 Pruritus ani: Secondary | ICD-10-CM | POA: Insufficient documentation

## 2022-10-30 DIAGNOSIS — M79675 Pain in left toe(s): Secondary | ICD-10-CM | POA: Diagnosis not present

## 2022-10-30 DIAGNOSIS — R159 Full incontinence of feces: Secondary | ICD-10-CM | POA: Insufficient documentation

## 2022-10-30 DIAGNOSIS — K580 Irritable bowel syndrome with diarrhea: Secondary | ICD-10-CM | POA: Insufficient documentation

## 2022-10-30 DIAGNOSIS — M79674 Pain in right toe(s): Secondary | ICD-10-CM

## 2022-10-30 DIAGNOSIS — B351 Tinea unguium: Secondary | ICD-10-CM

## 2022-10-30 DIAGNOSIS — S90122A Contusion of left lesser toe(s) without damage to nail, initial encounter: Secondary | ICD-10-CM

## 2022-10-30 DIAGNOSIS — R1031 Right lower quadrant pain: Secondary | ICD-10-CM | POA: Insufficient documentation

## 2022-10-30 DIAGNOSIS — K760 Fatty (change of) liver, not elsewhere classified: Secondary | ICD-10-CM | POA: Insufficient documentation

## 2022-11-03 NOTE — Progress Notes (Signed)
Subjective: Chief Complaint  Patient presents with   Debridement    Trim toenails/patient also dropped something on her toe a few weeks ago, feels better, but still red    82 y.o. returns the office today for painful, elongated, thickened toenails which she cannot trim herself and for calluses.  She said that she also dropped on her toe a couple weeks ago when she had pain but is doing better still little swollen and red but is able to walk without any pain at this time.  No skin breakdown or warmth.  PCP: [name], MD  Objective: AAO 3, NAD DP/PT pulses palpable, CRT less than 3 seconds Nails hypertrophic, dystrophic, elongated, brittle, discolored 10. There is tenderness overlying the nails 1-5 bilaterally. There is no surrounding erythema or drainage along the nail sites. There is minimal edema present of the second toe but there is no skin breakdown, ecchymosis.  No significant pain on exam. Hammertoes present as well as bunion. No pain with calf compression, swelling, warmth, erythema.  Assessment: Patient presents with symptomatic onychomycosis; possible fracture left second toe  Plan: -Treatment options including alternatives, risks, complications were discussed -X-rays obtained reviewed of the left foot.  Digital fomites noted with medial deviation of the digits.  On the middle phalanx concern for radiolucency transversely without any displacement. -Currently not having any pain.  Continue with shoes, good arch support and stiffer soled shoe to help facilitate healing.  Should she have any pain or increased swelling or symptoms to let me know. -Nails sharply debrided 10 without complication/bleeding.  Urea cream for toenails -Discussed daily foot inspection. If there are any changes, to call the office immediately.  -Follow-up in 3 months or sooner if any problems are to arise. In the meantime, encouraged to call the office with any questions, concerns, changes  symptoms.  Ovid Curd, DPM

## 2022-11-12 NOTE — Progress Notes (Signed)
[name] [address] [place] [address] Phone: [phone] Subjective:   [name], am serving as a scribe for Dr. Antoine Primas.  I'm seeing this patient by the request  of:  [name], MD  CC: right knee and foot pain   [id number]  08/18/2022 Stable at the moment.  No change in management, worsening pain will consider injection but I do think patient will do well.     Patient doing much better with the orthotics at this time.  Discussed icing regimen and home exercises otherwise.  Continue to wear the over-the-counter orthotics and we discussed other issues.  Follow-up again in 3 to 4 months      Update 11/17/2022 Veronica Franklin is a 82 y.o. female coming in with complaint of R knee and foot pain. Patient states that her knees are doing well. Did have some pain down the entire leg the other day.   C/o pain in L shoulder with abduction. Notices a popping in the upper arm as the day goes on. Pain began sometime this year. Also c/o tightness in the L cervical spine with rotation to the R.        Past Medical History:  Diagnosis Date   Complication of anesthesia    PONV   Diverticulosis    Fibromyalgia    GERD (gastroesophageal reflux disease)    Hyperlipidemia    Hypertension    IBS (irritable bowel syndrome)    Left fibular fracture 10/04/2013   MVP (mitral valve prolapse)    Patellar dislocation 10/04/2013   Left   Patellar fracture    Left   Pneumonia    Past Surgical History:  Procedure Laterality Date   APPENDECTOMY     BILATERAL SALPINGOOPHORECTOMY  07/10/1998   Endometrosis and LOA   BREAST BIOPSY Bilateral 09/2008   BREAST BIOPSY Right 1959   BREAST BIOPSY Right 1970's & 1981   CATARACT EXTRACTION Bilateral    CHOLECYSTECTOMY  02/2004   COLONOSCOPY     ear lanced  1944   EXCISION OF BREAST LESION Right 01/13/2022   Procedure: EXCISION OF RIGHT NIPPLE LESION;  Surgeon: [name], MD;   Location: [facility];  Service: General;  Laterality: Right;   HERNIA REPAIR     KNEE DISLOCATION SURGERY Left 2015   thigh lipoma Right    removed   TONSILLECTOMY     TOOTH EXTRACTION     UMBILICAL HERNIA REPAIR  07/10/1998   at same time of Ophorectomy   VAGINAL HYSTERECTOMY  1977   Social History   Socioeconomic History   Marital status: Divorced    Spouse name: Not on file   Number of children: 3   Years of education: Not on file   Highest education level: Not on file  Occupational History   Not on file  Tobacco Use   Smoking status: Never   Smokeless tobacco: Never  Vaping Use   Vaping Use: Never used  Substance and Sexual Activity   Alcohol use: No   Drug use: No   Sexual activity: Not Currently    Partners: Male  Other Topics Concern   Not on file  Social History Narrative   Not on file   Social Determinants of Health   Financial Resource Strain: Not on file  Food Insecurity: Not on file  Transportation Needs: Not on file  Physical Activity: Not on file  Stress: Not on file  Social Connections: Not  on file   Allergies  Allergen Reactions   Levaquin [Levofloxacin] Other (See Comments)    Muscle pain  Tendonitis    Augmentin [Amoxicillin-Pot Clavulanate] Nausea Only   Azithromycin Diarrhea and Nausea And Vomiting   Ciprofloxacin Nausea And Vomiting    Per Dr - told not to take due to Levaquin reation    Codeine Nausea And Vomiting   Demerol [Meperidine] Nausea And Vomiting   Provera [Medroxyprogesterone Acetate]     unknown   Sulfa Antibiotics Nausea And Vomiting   Tramadol Hcl Nausea And Vomiting   Percocet [Oxycodone-Acetaminophen] Nausea And Vomiting   Family History  Problem Relation Age of Onset   Heart failure Mother    Ulcers Mother    Hypertension Mother    Pleurisy Father    Cancer Father    Cancer Maternal Aunt        uterine cancer   Cancer Paternal Aunt        lymphoma   Diabetes Maternal Grandmother    Diverticulitis Son     Diverticulitis Daughter      Current Outpatient Medications (Cardiovascular):    hydrochlorothiazide (HYDRODIURIL) 12.5 MG tablet, Take 12.5 mg by mouth daily.   propranolol (INDERAL) 40 MG tablet, Take 40 mg by mouth 2 (two) times daily.  Current Outpatient Medications (Respiratory):    cetirizine (ZYRTEC) 10 MG tablet, Take 10 mg by mouth at bedtime.   fluticasone (FLONASE) 50 MCG/ACT nasal spray, Place 1 spray into both nostrils daily.   fluticasone (FLOVENT DISKUS) 50 MCG/BLIST diskus inhaler, Inhale 1 puff into the lungs 2 (two) times daily.   ipratropium (ATROVENT) 0.03 % nasal spray, Place 1-2 sprays into both nostrils as needed (congestion).   montelukast (SINGULAIR) 10 MG tablet, Take 10 mg by mouth daily.    SALINE NA, Place 2 sprays into the nose as needed (congestion).   sodium chloride (OCEAN) 0.65 % nasal spray, Place 2 sprays into the nose as needed for congestion.  Current Outpatient Medications (Analgesics):    acetaminophen (TYLENOL) 500 MG tablet, Take 1,000 mg by mouth as needed for headache (body aches).  Current Outpatient Medications (Hematological):    vitamin B-12 (CYANOCOBALAMIN) 1000 MCG tablet, Take 1,000 mcg by mouth at bedtime.  Current Outpatient Medications (Other):    Cholecalciferol (VITAMIN D3) 2000 units TABS, Take 2,000 Units by mouth at bedtime.   hydrocortisone cream 1 %, Apply 1 Application topically as needed (insect bites).   hyoscyamine (LEVBID) 0.375 MG 12 hr tablet, Take 1 tablet by mouth 2 (two) times daily.   OVER THE COUNTER MEDICATION, Apply 1 Application topically as needed (insect bites). MelaGel   OVER THE COUNTER MEDICATION, Take 1 tablet by mouth at bedtime. Plexus Pro-Bio   POTASSIUM GLUCONATE PO, Take 99 mg by mouth in the morning and at bedtime.   Tart Cherry (TART CHERRY ULTRA) 1200 MG CAPS, Take 1,200 mg by mouth in the morning and at bedtime.   triamcinolone cream (KENALOG) 0.1 %, Apply 1 Application topically as needed  (itching).   TURMERIC PO, Take 1,000 mg by mouth in the morning and at bedtime. 1000 mg   vitamin C (ASCORBIC ACID) 500 MG tablet, Take 500 mg by mouth at bedtime.   zinc gluconate 50 MG tablet, Take 50 mg by mouth at bedtime.   Reviewed prior external information including notes and imaging from  primary care provider As well as notes that were available from care everywhere and other healthcare systems.  Past medical history, social, surgical  and family history all reviewed in electronic medical record.  No pertanent information unless stated regarding to the chief complaint.   Review of Systems:  No headache, visual changes, nausea, vomiting, diarrhea, constipation, dizziness, abdominal pain, skin rash, fevers, chills, night sweats, weight loss, swollen lymph nodes, body aches, joint swelling, chest pain, shortness of breath, mood changes. POSITIVE muscle aches  Objective  Last menstrual period 12/05/1975.   General: No apparent distress alert and oriented x3 mood and affect normal, dressed appropriately.  HEENT: Pupils equal, extraocular movements intact  Respiratory: Patient's speak in full sentences and does not appear short of breath  Cardiovascular: No lower extremity edema, non tender, no erythema  Right knee exam shows relatively good positioning at the moment.  Nontender on exam today. Foot exam shows him seems to be doing relatively well.  Does have bunion and bunionette formation with breakdown of the transverse arch.  Left shoulder does have positive impingement noted with Neer and Hawkins.  Rotator cuff strength though does appear to be intact.  Mild positive apprehension test noted.  [id number]; 15 additional minutes spent for Therapeutic exercises as stated in above notes.  This included exercises focusing on stretching, strengthening, with significant focus on eccentric aspects.   Long term goals include an improvement in range of motion, strength, endurance as well as  avoiding reinjury. Patient's frequency would include in 1-2 times a day, 3-5 times a week for a duration of 6-12 weeks. Shoulder Exercises that included:  Basic scapular stabilization to include adduction and depression of scapula Scaption, focusing on proper movement and good control Internal and External rotation utilizing a theraband, with elbow tucked at side entire time Rows with theraband   Proper technique shown and discussed handout in great detail with ATC.  All questions were discussed and answered.       Impression and Recommendations:     The above documentation has been reviewed and is accurate and complete Judi Saa, DO

## 2022-11-17 ENCOUNTER — Encounter: Payer: Self-pay | Admitting: Family Medicine

## 2022-11-17 ENCOUNTER — Ambulatory Visit (INDEPENDENT_AMBULATORY_CARE_PROVIDER_SITE_OTHER): Payer: Medicare PPO

## 2022-11-17 ENCOUNTER — Ambulatory Visit: Payer: Medicare PPO | Admitting: Family Medicine

## 2022-11-17 VITALS — BP 110/82 | HR 69 | Ht 64.5 in | Wt 153.0 lb

## 2022-11-17 DIAGNOSIS — M7552 Bursitis of left shoulder: Secondary | ICD-10-CM

## 2022-11-17 DIAGNOSIS — M25512 Pain in left shoulder: Secondary | ICD-10-CM | POA: Diagnosis not present

## 2022-11-17 NOTE — Assessment & Plan Note (Signed)
Patient does have some bursitis noted, questionably could be calcific.  We do hear some audible popping.  No significant weakness though of the shoulder noted.  Given home exercises that I think patient will do well.  Work with Event organiser.  Prescription regimen and home exercises.  Discussed which activities to do and which ones to avoid.  Follow-up with me again in 6 to 8 weeks otherwise.

## 2022-11-17 NOTE — Patient Instructions (Signed)
Exercises Hands in peripheral vision Xray today See me in 2-3 months

## 2022-11-24 DIAGNOSIS — Z85828 Personal history of other malignant neoplasm of skin: Secondary | ICD-10-CM | POA: Diagnosis not present

## 2022-11-24 DIAGNOSIS — L821 Other seborrheic keratosis: Secondary | ICD-10-CM | POA: Diagnosis not present

## 2022-11-24 DIAGNOSIS — D2261 Melanocytic nevi of right upper limb, including shoulder: Secondary | ICD-10-CM | POA: Diagnosis not present

## 2022-11-24 DIAGNOSIS — L570 Actinic keratosis: Secondary | ICD-10-CM | POA: Diagnosis not present

## 2022-11-24 DIAGNOSIS — L814 Other melanin hyperpigmentation: Secondary | ICD-10-CM | POA: Diagnosis not present

## 2023-01-12 ENCOUNTER — Ambulatory Visit: Payer: Medicare PPO | Admitting: Podiatry

## 2023-01-12 ENCOUNTER — Encounter: Payer: Self-pay | Admitting: Podiatry

## 2023-01-12 DIAGNOSIS — M79675 Pain in left toe(s): Secondary | ICD-10-CM

## 2023-01-12 DIAGNOSIS — M79674 Pain in right toe(s): Secondary | ICD-10-CM

## 2023-01-12 DIAGNOSIS — B351 Tinea unguium: Secondary | ICD-10-CM

## 2023-01-19 NOTE — Progress Notes (Signed)
Subjective: Chief Complaint  Patient presents with   Debridement    Trim toenails/calluses    83 y.o. returns the office today for painful, elongated, thickened toenails which she cannot trim herself and for calluses.  No other concerns today.    PCP: [name], MD Last seen October 27, 2022  Objective: AAO 3, NAD DP/PT pulses palpable, CRT less than 3 seconds Nails hypertrophic, dystrophic, elongated, brittle, discolored 10. There is tenderness overlying the nails 1-5 bilaterally. There is no surrounding erythema or drainage along the nail sites. Hammertoes present as well as bunion. No pain with calf compression, swelling, warmth, erythema.  Assessment: Patient presents with symptomatic onychomycosis; possible fracture left second toe  Plan: -Treatment options including alternatives, risks, complications were discussed -Nails sharply debrided 10 without complication/bleeding.  Urea cream for toenails -Discussed daily foot inspection. If there are any changes, to call the office immediately.  -Follow-up in 3 months or sooner if any problems are to arise. In the meantime, encouraged to call the office with any questions, concerns, changes symptoms.  Ovid Curd, DPM

## 2023-02-17 NOTE — Progress Notes (Unsigned)
[name] [address] [place] [address] Phone: [phone] Subjective:   [name], am serving as a scribe for Dr. Antoine Primas.  I'm seeing this patient by the request  of:  [name], MD  CC: Left shoulder pain follow-up  [id number]  11/17/2022 Patient does have some bursitis noted, questionably could be calcific.  We do hear some audible popping.  No significant weakness though of the shoulder noted.  Given home exercises that I think patient will do well.  Work with Event organiser.  Prescription regimen and home exercises.  Discussed which activities to do and which ones to avoid.  Follow-up with me again in 6 to 8 weeks otherwise.      Update 02/18/2023 Veronica Franklin is a 82 y.o. female coming in with complaint of L shoulder pain.  Found to have more of a calcific bursitis noted previously.  Patient states shoulder is feeling better. Occasional pain but very little      Past Medical History:  Diagnosis Date   Complication of anesthesia    PONV   Diverticulosis    Fibromyalgia    GERD (gastroesophageal reflux disease)    Hyperlipidemia    Hypertension    IBS (irritable bowel syndrome)    Left fibular fracture 10/04/2013   MVP (mitral valve prolapse)    Patellar dislocation 10/04/2013   Left   Patellar fracture    Left   Pneumonia    Past Surgical History:  Procedure Laterality Date   APPENDECTOMY     BILATERAL SALPINGOOPHORECTOMY  07/10/1998   Endometrosis and LOA   BREAST BIOPSY Bilateral 09/2008   BREAST BIOPSY Right 1959   BREAST BIOPSY Right 1970's & 1981   CATARACT EXTRACTION Bilateral    CHOLECYSTECTOMY  02/2004   COLONOSCOPY     ear lanced  1944   EXCISION OF BREAST LESION Right 01/13/2022   Procedure: EXCISION OF RIGHT NIPPLE LESION;  Surgeon: [name], MD;  Location: [facility];  Service: General;  Laterality: Right;   HERNIA REPAIR     KNEE DISLOCATION SURGERY Left 2015   thigh  lipoma Right    removed   TONSILLECTOMY     TOOTH EXTRACTION     UMBILICAL HERNIA REPAIR  07/10/1998   at same time of Ophorectomy   VAGINAL HYSTERECTOMY  1977   Social History   Socioeconomic History   Marital status: Divorced    Spouse name: Not on file   Number of children: 3   Years of education: Not on file   Highest education level: Not on file  Occupational History   Not on file  Tobacco Use   Smoking status: Never   Smokeless tobacco: Never  Vaping Use   Vaping status: Never Used  Substance and Sexual Activity   Alcohol use: No   Drug use: No   Sexual activity: Not Currently    Partners: Male  Other Topics Concern   Not on file  Social History Narrative   Not on file   Social Determinants of Health   Financial Resource Strain: Not on file  Food Insecurity: Not on file  Transportation Needs: Not on file  Physical Activity: Not on file  Stress: Not on file  Social Connections: Not on file   Allergies  Allergen Reactions   Levaquin [Levofloxacin] Other (See Comments)    Muscle pain  Tendonitis    Augmentin [Amoxicillin-Pot Clavulanate] Nausea Only   Azithromycin Diarrhea and  Nausea And Vomiting   Ciprofloxacin Nausea And Vomiting    Per Dr - told not to take due to Levaquin reation    Codeine Nausea And Vomiting   Demerol [Meperidine] Nausea And Vomiting   Provera [Medroxyprogesterone Acetate]     unknown   Sulfa Antibiotics Nausea And Vomiting   Tramadol Hcl Nausea And Vomiting   Percocet [Oxycodone-Acetaminophen] Nausea And Vomiting   Family History  Problem Relation Age of Onset   Heart failure Mother    Ulcers Mother    Hypertension Mother    Pleurisy Father    Cancer Father    Cancer Maternal Aunt        uterine cancer   Cancer Paternal Aunt        lymphoma   Diabetes Maternal Grandmother    Diverticulitis Son    Diverticulitis Daughter      Current Outpatient Medications (Cardiovascular):    hydrochlorothiazide (HYDRODIURIL) 12.5  MG tablet, Take 12.5 mg by mouth daily.   propranolol (INDERAL) 40 MG tablet, Take 40 mg by mouth 2 (two) times daily.  Current Outpatient Medications (Respiratory):    cetirizine (ZYRTEC) 10 MG tablet, Take 10 mg by mouth at bedtime.   fluticasone (FLONASE) 50 MCG/ACT nasal spray, Place 1 spray into both nostrils daily.   ipratropium (ATROVENT) 0.03 % nasal spray, Place 1-2 sprays into both nostrils as needed (congestion).   montelukast (SINGULAIR) 10 MG tablet, Take 10 mg by mouth daily.    SALINE NA, Place 2 sprays into the nose as needed (congestion).   sodium chloride (OCEAN) 0.65 % nasal spray, Place 2 sprays into the nose as needed for congestion.  Current Outpatient Medications (Analgesics):    acetaminophen (TYLENOL) 500 MG tablet, Take 1,000 mg by mouth as needed for headache (body aches).  Current Outpatient Medications (Hematological):    vitamin B-12 (CYANOCOBALAMIN) 1000 MCG tablet, Take 1,000 mcg by mouth at bedtime.  Current Outpatient Medications (Other):    Cholecalciferol (VITAMIN D3) 2000 units TABS, Take 2,000 Units by mouth at bedtime.   hydrocortisone cream 1 %, Apply 1 Application topically as needed (insect bites).   hyoscyamine (LEVBID) 0.375 MG 12 hr tablet, Take 1 tablet by mouth 2 (two) times daily.   OVER THE COUNTER MEDICATION, Apply 1 Application topically as needed (insect bites). MelaGel   OVER THE COUNTER MEDICATION, Take 1 tablet by mouth at bedtime. Plexus Pro-Bio   POTASSIUM GLUCONATE PO, Take 99 mg by mouth in the morning and at bedtime.   Tart Cherry (TART CHERRY ULTRA) 1200 MG CAPS, Take 1,200 mg by mouth in the morning and at bedtime.   triamcinolone cream (KENALOG) 0.1 %, Apply 1 Application topically as needed (itching).   TURMERIC PO, Take 1,000 mg by mouth in the morning and at bedtime. 1000 mg   vitamin C (ASCORBIC ACID) 500 MG tablet, Take 500 mg by mouth at bedtime.   zinc gluconate 50 MG tablet, Take 50 mg by mouth at  bedtime.   Reviewed prior external information including notes and imaging from  primary care provider As well as notes that were available from care everywhere and other healthcare systems.  Past medical history, social, surgical and family history all reviewed in electronic medical record.  No pertanent information unless stated regarding to the chief complaint.   Review of Systems:  No headache, visual changes, nausea, vomiting, diarrhea, constipation, dizziness, abdominal pain, skin rash, fevers, chills, night sweats, weight loss, swollen lymph nodes, body aches, joint swelling, chest pain,  shortness of breath, mood changes. POSITIVE muscle aches  Objective  Pulse 72, height 5\' 4"  (1.626 m), weight 151 lb (68.5 kg), last menstrual period 12/05/1975, SpO2 97%.   General: No apparent distress alert and oriented x3 mood and affect normal, dressed appropriately.  HEENT: Pupils equal, extraocular movements intact  Respiratory: Patient's speak in full sentences and does not appear short of breath  Cardiovascular: No lower extremity edema, non tender, no erythema   Left shoulder does have improvement significantly in range of motion at this point.  Negative impingement noted today.  Rotator cuff strength is intact.    Limited muscular skeletal ultrasound was performed and interpreted by Antoine Primas, M  Limited ultrasound shows significant decrease in the hyperechoic changes that was noted that was consistent with the calcific changes seen previously.  Still has what appears to be a subacromial bursitis noted.     Impression and Recommendations:    The above documentation has been reviewed and is accurate and complete Judi Saa, DO

## 2023-02-18 ENCOUNTER — Ambulatory Visit: Payer: Medicare PPO | Admitting: Family Medicine

## 2023-02-18 ENCOUNTER — Other Ambulatory Visit: Payer: Self-pay

## 2023-02-18 ENCOUNTER — Encounter: Payer: Self-pay | Admitting: Family Medicine

## 2023-02-18 VITALS — HR 72 | Ht 64.0 in | Wt 151.0 lb

## 2023-02-18 DIAGNOSIS — M7552 Bursitis of left shoulder: Secondary | ICD-10-CM

## 2023-02-18 DIAGNOSIS — M25512 Pain in left shoulder: Secondary | ICD-10-CM

## 2023-02-18 NOTE — Patient Instructions (Signed)
See you again in 3-4 months Probiotic with at least 10 strains and 10 billion units

## 2023-02-18 NOTE — Assessment & Plan Note (Signed)
Significant improvement noted with just the home exercises at this time.  Discussed icing regimen and home exercises, discussed continuing to be active and the vitamin D supplementation.  Patient would like to follow-up again in 3 to 6 months to make sure continues to improve.

## 2023-03-16 ENCOUNTER — Ambulatory Visit: Payer: Medicare PPO | Admitting: Podiatry

## 2023-03-16 DIAGNOSIS — B351 Tinea unguium: Secondary | ICD-10-CM

## 2023-03-16 DIAGNOSIS — Q828 Other specified congenital malformations of skin: Secondary | ICD-10-CM

## 2023-03-16 DIAGNOSIS — M79675 Pain in left toe(s): Secondary | ICD-10-CM | POA: Diagnosis not present

## 2023-03-16 DIAGNOSIS — M79674 Pain in right toe(s): Secondary | ICD-10-CM | POA: Diagnosis not present

## 2023-03-16 NOTE — Progress Notes (Signed)
Subjective: No chief complaint on file.   82 y.o. returns the office today for painful, elongated, thickened toenails which she cannot trim herself and for calluses.  She says she has been getting ongoing pain for quite some time to her feet.  She is on multiple shoes and she does not with tissues the same container well.  She does not report any injuries.  She has pain more to the lateral aspect of the right foot.  PCP: [name], MD Last seen October 27, 2022  Objective: AAO 3, NAD DP/PT pulses palpable, CRT less than 3 seconds Nails hypertrophic, dystrophic, elongated, brittle, discolored 10. There is tenderness overlying the nails 1-5 bilaterally. There is no surrounding erythema or drainage along the nail sites. Hyperkeratotic lesions noted submetatarsal 5 bilaterally as well as submetatarsal 2, 3.  There is no underlying ulceration drainage or any signs of infection.  Manson Passey is an exercise clinically.  Not able to elicit any area pinpoint tenderness today. Hammertoes present as well as bunion. No pain with calf compression, swelling, warmth, erythema.  Assessment: Patient presents with symptomatic onychomycosis; hyperkeratotic lesions  Plan: -Treatment options including alternatives, risks, complications were discussed -Nails sharply debrided 10 without complication/bleeding.  Urea cream for toenails -Separate hyperkeratotic lesions x 4 any complications or bleeding.   -She does tend to get thicker calluses noted submetatarsal 5 bilaterally and think she is putting more pressure laterally.  Discussed with her more of a custom orthotic with accommodation to help offload.  We could schedule her for this if she would like.  Continue supportive shoes and changing shoes regular.  She does have over-the-counter inserts inside of her shoes. -Discussed daily foot inspection. If there are any changes, to call the office immediately.  -Follow-up in 3 months or sooner if any problems  are to arise. In the meantime, encouraged to call the office with any questions, concerns, changes symptoms.  Ovid Curd, DPM

## 2023-05-18 ENCOUNTER — Ambulatory Visit: Payer: Medicare PPO | Admitting: Podiatry

## 2023-05-24 NOTE — Progress Notes (Unsigned)
[name] [address] [place] [address] Phone: [phone] Subjective:   [name], am serving as a scribe for Dr. Antoine Primas.  I'm seeing this patient by the request  of:  [name], MD  CC: Foot pain more than anything else  [id number]  02/18/2023 Significant improvement noted with just the home exercises at this time.  Discussed icing regimen and home exercises, discussed continuing to be active and the vitamin D supplementation.  Patient would like to follow-up again in 3 to 6 months to make sure continues to improve.      Update 05/25/2023 Veronica Franklin is a 82 y.o. female coming in with complaint of L shoulder pain. Patient states shoulder is doing better. Here more for her L foot. Pain with walking. Goes to podiatry. Looking for your good ideas.      Past Medical History:  Diagnosis Date   Complication of anesthesia    PONV   Diverticulosis    Fibromyalgia    GERD (gastroesophageal reflux disease)    Hyperlipidemia    Hypertension    IBS (irritable bowel syndrome)    Left fibular fracture 10/04/2013   MVP (mitral valve prolapse)    Patellar dislocation 10/04/2013   Left   Patellar fracture    Left   Pneumonia    Past Surgical History:  Procedure Laterality Date   APPENDECTOMY     BILATERAL SALPINGOOPHORECTOMY  07/10/1998   Endometrosis and LOA   BREAST BIOPSY Bilateral 09/2008   BREAST BIOPSY Right 1959   BREAST BIOPSY Right 1970's & 1981   CATARACT EXTRACTION Bilateral    CHOLECYSTECTOMY  02/2004   COLONOSCOPY     ear lanced  1944   EXCISION OF BREAST LESION Right 01/13/2022   Procedure: EXCISION OF RIGHT NIPPLE LESION;  Surgeon: [name], MD;  Location: [facility];  Service: General;  Laterality: Right;   HERNIA REPAIR     KNEE DISLOCATION SURGERY Left 2015   thigh lipoma Right    removed   TONSILLECTOMY     TOOTH EXTRACTION     UMBILICAL HERNIA REPAIR  07/10/1998   at same time  of Ophorectomy   VAGINAL HYSTERECTOMY  1977   Social History   Socioeconomic History   Marital status: Divorced    Spouse name: Not on file   Number of children: 3   Years of education: Not on file   Highest education level: Not on file  Occupational History   Not on file  Tobacco Use   Smoking status: Never   Smokeless tobacco: Never  Vaping Use   Vaping status: Never Used  Substance and Sexual Activity   Alcohol use: No   Drug use: No   Sexual activity: Not Currently    Partners: Male  Other Topics Concern   Not on file  Social History Narrative   Not on file   Social Determinants of Health   Financial Resource Strain: Not on file  Food Insecurity: Not on file  Transportation Needs: Not on file  Physical Activity: Not on file  Stress: Not on file  Social Connections: Not on file   Allergies  Allergen Reactions   Levaquin [Levofloxacin] Other (See Comments)    Muscle pain  Tendonitis    Augmentin [Amoxicillin-Pot Clavulanate] Nausea Only   Azithromycin Diarrhea and Nausea And Vomiting   Ciprofloxacin Nausea And Vomiting    Per Dr - told not to take due to Levaquin  reation    Codeine Nausea And Vomiting   Demerol [Meperidine] Nausea And Vomiting   Provera [Medroxyprogesterone Acetate]     unknown   Sulfa Antibiotics Nausea And Vomiting   Tramadol Hcl Nausea And Vomiting   Percocet [Oxycodone-Acetaminophen] Nausea And Vomiting   Family History  Problem Relation Age of Onset   Heart failure Mother    Ulcers Mother    Hypertension Mother    Pleurisy Father    Cancer Father    Cancer Maternal Aunt        uterine cancer   Cancer Paternal Aunt        lymphoma   Diabetes Maternal Grandmother    Diverticulitis Son    Diverticulitis Daughter      Current Outpatient Medications (Cardiovascular):    hydrochlorothiazide (HYDRODIURIL) 12.5 MG tablet, Take 12.5 mg by mouth daily.   propranolol (INDERAL) 40 MG tablet, Take 40 mg by mouth 2 (two) times  daily.  Current Outpatient Medications (Respiratory):    cetirizine (ZYRTEC) 10 MG tablet, Take 10 mg by mouth at bedtime.   fluticasone (FLONASE) 50 MCG/ACT nasal spray, Place 1 spray into both nostrils daily.   ipratropium (ATROVENT) 0.03 % nasal spray, Place 1-2 sprays into both nostrils as needed (congestion).   montelukast (SINGULAIR) 10 MG tablet, Take 10 mg by mouth daily.    SALINE NA, Place 2 sprays into the nose as needed (congestion).   sodium chloride (OCEAN) 0.65 % nasal spray, Place 2 sprays into the nose as needed for congestion.  Current Outpatient Medications (Analgesics):    acetaminophen (TYLENOL) 500 MG tablet, Take 1,000 mg by mouth as needed for headache (body aches).  Current Outpatient Medications (Hematological):    vitamin B-12 (CYANOCOBALAMIN) 1000 MCG tablet, Take 1,000 mcg by mouth at bedtime.  Current Outpatient Medications (Other):    Cholecalciferol (VITAMIN D3) 2000 units TABS, Take 2,000 Units by mouth at bedtime.   hydrocortisone cream 1 %, Apply 1 Application topically as needed (insect bites).   hyoscyamine (LEVBID) 0.375 MG 12 hr tablet, Take 1 tablet by mouth 2 (two) times daily.   OVER THE COUNTER MEDICATION, Apply 1 Application topically as needed (insect bites). MelaGel   OVER THE COUNTER MEDICATION, Take 1 tablet by mouth at bedtime. Plexus Pro-Bio   POTASSIUM GLUCONATE PO, Take 99 mg by mouth in the morning and at bedtime.   Tart Cherry (TART CHERRY ULTRA) 1200 MG CAPS, Take 1,200 mg by mouth in the morning and at bedtime.   triamcinolone cream (KENALOG) 0.1 %, Apply 1 Application topically as needed (itching).   TURMERIC PO, Take 1,000 mg by mouth in the morning and at bedtime. 1000 mg   vitamin C (ASCORBIC ACID) 500 MG tablet, Take 500 mg by mouth at bedtime.   zinc gluconate 50 MG tablet, Take 50 mg by mouth at bedtime.   Reviewed prior external information including notes and imaging from  primary care provider As well as notes that were  available from care everywhere and other healthcare systems.  Past medical history, social, surgical and family history all reviewed in electronic medical record.  No pertanent information unless stated regarding to the chief complaint.   Review of Systems:  No headache, visual changes, nausea, vomiting, diarrhea, constipation, dizziness, abdominal pain, skin rash, fevers, chills, night sweats, weight loss, swollen lymph nodes, body aches, joint swelling, chest pain, shortness of breath, mood changes. POSITIVE muscle aches  Objective  Blood pressure 109/66, height 5\' 4"  (1.626 m), weight 151 lb (  68.5 kg), last menstrual period 12/05/1975.   General: No apparent distress alert and oriented x3 mood and affect normal, dressed appropriately.  HEENT: Pupils equal, extraocular movements intact  Respiratory: Patient's speak in full sentences and does not appear short of breath  Cardiovascular: No lower extremity edema, non tender, no erythema  Foot exam shows significant breakdown of the transverse arch bilaterally.  Callus formation underneath the first and fifth toes significantly at the moment.  No significant skin breakdown but more skin buildup in this area.  Does have some overlapping in the first and second toes as well as some bunion and bunionette formations noted.    Impression and Recommendations:     The above documentation has been reviewed and is accurate and complete Judi Saa, DO

## 2023-05-25 ENCOUNTER — Ambulatory Visit: Payer: Medicare PPO | Admitting: Family Medicine

## 2023-05-25 ENCOUNTER — Encounter: Payer: Self-pay | Admitting: Family Medicine

## 2023-05-25 VITALS — BP 109/66 | Ht 64.0 in | Wt 151.0 lb

## 2023-05-25 DIAGNOSIS — M216X9 Other acquired deformities of unspecified foot: Secondary | ICD-10-CM

## 2023-05-25 NOTE — Patient Instructions (Signed)
Wart remove cream every day for a week Pumice on feet Continue to wear shoes with orthotics We'll see if Dr. Tenny Craw witt change your HTCZ See you again in 3 months

## 2023-05-25 NOTE — Assessment & Plan Note (Signed)
Continues to have more foot pain than anything else at this time.  Discussed icing regimen and home exercises, discussed which activities to do and which ones to avoid.  Discussed proper shoes and how to get rid of some of the callus formation noted.  Increasing activity slowly otherwise.  Follow-up again in 3 months.

## 2023-06-14 ENCOUNTER — Ambulatory Visit: Payer: Medicare PPO | Admitting: Podiatry

## 2023-06-14 ENCOUNTER — Encounter: Payer: Self-pay | Admitting: Podiatry

## 2023-06-14 DIAGNOSIS — Q828 Other specified congenital malformations of skin: Secondary | ICD-10-CM

## 2023-06-14 DIAGNOSIS — M79675 Pain in left toe(s): Secondary | ICD-10-CM

## 2023-06-14 DIAGNOSIS — M79674 Pain in right toe(s): Secondary | ICD-10-CM | POA: Diagnosis not present

## 2023-06-14 DIAGNOSIS — B351 Tinea unguium: Secondary | ICD-10-CM | POA: Diagnosis not present

## 2023-06-15 DIAGNOSIS — D225 Melanocytic nevi of trunk: Secondary | ICD-10-CM | POA: Diagnosis not present

## 2023-06-15 DIAGNOSIS — D692 Other nonthrombocytopenic purpura: Secondary | ICD-10-CM | POA: Diagnosis not present

## 2023-06-15 DIAGNOSIS — L308 Other specified dermatitis: Secondary | ICD-10-CM | POA: Diagnosis not present

## 2023-06-15 DIAGNOSIS — L814 Other melanin hyperpigmentation: Secondary | ICD-10-CM | POA: Diagnosis not present

## 2023-06-15 DIAGNOSIS — D485 Neoplasm of uncertain behavior of skin: Secondary | ICD-10-CM | POA: Diagnosis not present

## 2023-06-15 DIAGNOSIS — C44629 Squamous cell carcinoma of skin of left upper limb, including shoulder: Secondary | ICD-10-CM | POA: Diagnosis not present

## 2023-06-15 DIAGNOSIS — L821 Other seborrheic keratosis: Secondary | ICD-10-CM | POA: Diagnosis not present

## 2023-06-15 DIAGNOSIS — L280 Lichen simplex chronicus: Secondary | ICD-10-CM | POA: Diagnosis not present

## 2023-06-15 DIAGNOSIS — L570 Actinic keratosis: Secondary | ICD-10-CM | POA: Diagnosis not present

## 2023-06-15 DIAGNOSIS — Z85828 Personal history of other malignant neoplasm of skin: Secondary | ICD-10-CM | POA: Diagnosis not present

## 2023-06-16 NOTE — Progress Notes (Signed)
Subjective: Chief Complaint  Patient presents with   RFC    RM#[id number] RFC      82 y.o. returns the office today for painful, elongated, thickened toenails which she cannot trim herself and for calluses.  She is having quite a bit of pain to the calluses that she was using over-the-counter corn pad which did help some but there is still some callus formation which is causing pain.  She also has seen sports medicine for her foot pain.  No open lesions that she reports.  No recent injury or changes.  PCP: [name], MD Last seen October 27, 2022  Objective: AAO 3, NAD DP/PT pulses palpable, CRT less than 3 seconds Nails hypertrophic, dystrophic, elongated, brittle, discolored 10. There is tenderness overlying the nails 1-5 bilaterally. There is no surrounding erythema or drainage along the nail sites. Hyperkeratotic lesions noted submetatarsal 5 bilaterally as well as submetatarsal 2, 3.  There is no underlying ulceration drainage or any signs of infection.  Not able to elicit any area pinpoint tenderness today. Hammertoes present as well as bunion. No pain with calf compression, swelling, warmth, erythema.  Assessment: Patient presents with symptomatic onychomycosis; hyperkeratotic lesions  Plan: -Treatment options including alternatives, risks, complications were discussed -Nails sharply debrided 10 without complication/bleeding.  Urea cream for toenails -Separate hyperkeratotic lesions x 4 any complications or bleeding.   -She does tend to get thicker calluses noted submetatarsal 5 bilaterally and think she is putting more pressure laterally.  We Discussed proper shoes to help offload.  Moisturizer daily.   -Discussed daily foot inspection. If there are any changes, to call the office immediately.  -Follow-up in 3 months or sooner if any problems are to arise. In the meantime, encouraged to call the office with any questions, concerns, changes symptoms.  Ovid Curd, DPM

## 2023-07-06 ENCOUNTER — Encounter (HOSPITAL_BASED_OUTPATIENT_CLINIC_OR_DEPARTMENT_OTHER): Payer: Self-pay

## 2023-07-06 ENCOUNTER — Ambulatory Visit (HOSPITAL_BASED_OUTPATIENT_CLINIC_OR_DEPARTMENT_OTHER): Payer: Medicare PPO

## 2023-07-08 ENCOUNTER — Ambulatory Visit (HOSPITAL_BASED_OUTPATIENT_CLINIC_OR_DEPARTMENT_OTHER): Payer: Medicare PPO | Admitting: *Deleted

## 2023-07-08 ENCOUNTER — Other Ambulatory Visit (HOSPITAL_BASED_OUTPATIENT_CLINIC_OR_DEPARTMENT_OTHER): Payer: Self-pay | Admitting: Obstetrics & Gynecology

## 2023-07-08 DIAGNOSIS — R300 Dysuria: Secondary | ICD-10-CM | POA: Diagnosis not present

## 2023-07-08 LAB — POCT URINALYSIS DIPSTICK
Appearance: NORMAL
Bilirubin, UA: NEGATIVE
Glucose, UA: NEGATIVE
Ketones, UA: NEGATIVE
Nitrite, UA: POSITIVE
Odor: ABNORMAL
Protein, UA: POSITIVE — AB
Urobilinogen, UA: 0.2 U/dL
pH, UA: 6 (ref 5.0–8.0)

## 2023-07-08 MED ORDER — NITROFURANTOIN MONOHYD MACRO 100 MG PO CAPS: 100.0000 mg | ORAL_CAPSULE | Freq: Two times a day (BID) | ORAL | 0 refills | Status: DC

## 2023-07-08 NOTE — Progress Notes (Signed)
 Pt here to drop off urine for possible uti. Pt came to office Tuesday but was unable to urinate. Pt was provided with sterile cup and cleaning wipes to obtain specimen at home and to bring into office after collection. POCT showed nitrates. Rx sent to pharmacy for treatment of symptoms.

## 2023-07-11 LAB — URINE CULTURE

## 2023-07-13 ENCOUNTER — Other Ambulatory Visit (HOSPITAL_BASED_OUTPATIENT_CLINIC_OR_DEPARTMENT_OTHER): Payer: Self-pay | Admitting: Obstetrics & Gynecology

## 2023-07-13 ENCOUNTER — Other Ambulatory Visit (HOSPITAL_BASED_OUTPATIENT_CLINIC_OR_DEPARTMENT_OTHER): Payer: Self-pay

## 2023-07-13 DIAGNOSIS — N3000 Acute cystitis without hematuria: Secondary | ICD-10-CM

## 2023-07-13 MED ORDER — CEPHALEXIN 500 MG PO CAPS
500.0000 mg | ORAL_CAPSULE | Freq: Two times a day (BID) | ORAL | 0 refills | Status: DC
  Filled 2023-07-13: qty 10, 5d supply, fill #0

## 2023-07-15 ENCOUNTER — Other Ambulatory Visit (HOSPITAL_BASED_OUTPATIENT_CLINIC_OR_DEPARTMENT_OTHER): Payer: Self-pay

## 2023-07-15 MED ORDER — BETAMETHASONE DIPROPIONATE AUG 0.05 % EX OINT: TOPICAL_OINTMENT | CUTANEOUS | 2 refills | Status: DC

## 2023-07-15 MED ORDER — HYOSCYAMINE SULFATE ER 0.375 MG PO TB12
0.3750 mg | ORAL_TABLET | Freq: Two times a day (BID) | ORAL | 1 refills | Status: DC
  Filled 2023-07-15: qty 180, 90d supply, fill #0

## 2023-07-16 ENCOUNTER — Other Ambulatory Visit: Payer: Self-pay

## 2023-07-16 ENCOUNTER — Other Ambulatory Visit (HOSPITAL_BASED_OUTPATIENT_CLINIC_OR_DEPARTMENT_OTHER): Payer: Self-pay

## 2023-07-19 ENCOUNTER — Ambulatory Visit (HOSPITAL_BASED_OUTPATIENT_CLINIC_OR_DEPARTMENT_OTHER): Payer: Medicare PPO | Admitting: Obstetrics & Gynecology

## 2023-07-19 ENCOUNTER — Encounter (HOSPITAL_BASED_OUTPATIENT_CLINIC_OR_DEPARTMENT_OTHER): Payer: Self-pay | Admitting: Obstetrics & Gynecology

## 2023-07-19 ENCOUNTER — Other Ambulatory Visit (HOSPITAL_BASED_OUTPATIENT_CLINIC_OR_DEPARTMENT_OTHER): Payer: Self-pay

## 2023-07-19 VITALS — BP 124/65 | HR 75 | Ht 64.0 in | Wt 151.0 lb

## 2023-07-19 DIAGNOSIS — N3000 Acute cystitis without hematuria: Secondary | ICD-10-CM

## 2023-07-19 DIAGNOSIS — N8110 Cystocele, unspecified: Secondary | ICD-10-CM | POA: Diagnosis not present

## 2023-07-19 NOTE — Progress Notes (Signed)
 GYNECOLOGY  VISIT  CC:   history of UTI  HPI: 83 y.o. G3P3 Divorced White or Caucasian female here for follow up after being diagnosed with UTI 07/08/2023 with Kelbsiella positive urine culture.  She has long hx of large cystocele.  We did try a pessary in the past.  She was not successful with using a pessary.  Typically she gets up once at night and then several times in the morning.  Then she is able to go several hours without voiding.  With the most recent UTI, she starting having a lot more frequency and hesitancy as well.  She was initially treated with macrobid  but the sensitivities showed intermediate sensitivity so was switched to Keflex .  She is still on this today.  She brought a urine with her today but is still on the antibiotic.    She reports her urgency and hesitancy is improved.  Urine is not cloudy at this time.  I do feel she should repeat the urine culture after she has been off the antibiotic for a week or more.  Future urine culture will be placed so she can return this to the lab.    She is planning on starting a pre and probiotic supplement.  She is going to wait to start this until after her urine culture is finalized.   Past Medical History:  Diagnosis Date   Complication of anesthesia    PONV   Diverticulosis    Fibromyalgia    GERD (gastroesophageal reflux disease)    Hyperlipidemia    Hypertension    IBS (irritable bowel syndrome)    Left fibular fracture 10/04/2013   MVP (mitral valve prolapse)    Patellar dislocation 10/04/2013   Left   Patellar fracture    Left   Pneumonia     MEDS:   Current Outpatient Medications on File Prior to Visit  Medication Sig Dispense Refill   acetaminophen  (TYLENOL ) 500 MG tablet Take 1,000 mg by mouth as needed for headache (body aches).     augmented betamethasone  dipropionate (DIPROLENE -AF) 0.05 % ointment Apply sparingly to affected area(s) twice daily 50 g 2   cephALEXin  (KEFLEX ) 500 MG capsule Take 1 capsule (500 mg  total) by mouth 2 (two) times daily. 10 capsule 0   cetirizine (ZYRTEC) 10 MG tablet Take 10 mg by mouth at bedtime.     Cholecalciferol (VITAMIN D3) 2000 units TABS Take 2,000 Units by mouth at bedtime.     fluticasone (FLONASE) 50 MCG/ACT nasal spray Place 1 spray into both nostrils daily.     hydrochlorothiazide  (HYDRODIURIL ) 12.5 MG tablet Take 12.5 mg by mouth daily.     hydrocortisone cream 1 % Apply 1 Application topically as needed (insect bites).     hyoscyamine  (LEVBID ) 0.375 MG 12 hr tablet Take 1 tablet by mouth 2 (two) times daily.     hyoscyamine  (LEVBID ) 0.375 MG 12 hr tablet Take 1 tablet (0.375 mg total) by mouth every 12hours at breakfast and bedtime. 180 tablet 1   ipratropium (ATROVENT) 0.03 % nasal spray Place 1-2 sprays into both nostrils as needed (congestion).     OVER THE COUNTER MEDICATION Apply 1 Application topically as needed (insect bites). MelaGel     OVER THE COUNTER MEDICATION Take 1 tablet by mouth at bedtime. Plexus Pro-Bio     POTASSIUM GLUCONATE PO Take 99 mg by mouth in the morning and at bedtime.     propranolol  (INDERAL ) 40 MG tablet Take 40 mg by mouth 2 (  two) times daily.     SALINE NA Place 2 sprays into the nose as needed (congestion).     sodium chloride  (OCEAN) 0.65 % nasal spray Place 2 sprays into the nose as needed for congestion.     Tart Cherry (TART CHERRY ULTRA) 1200 MG CAPS Take 1,200 mg by mouth in the morning and at bedtime.     triamcinolone  cream (KENALOG) 0.1 % Apply 1 Application topically as needed (itching).  1   TURMERIC PO Take 1,000 mg by mouth in the morning and at bedtime. 1000 mg     vitamin B-12 (CYANOCOBALAMIN) 1000 MCG tablet Take 1,000 mcg by mouth at bedtime.     vitamin C (ASCORBIC ACID) 500 MG tablet Take 500 mg by mouth at bedtime.     zinc gluconate 50 MG tablet Take 50 mg by mouth at bedtime.     No current facility-administered medications on file prior to visit.    ALLERGIES: Levaquin [levofloxacin], Augmentin   [amoxicillin -pot clavulanate], Azithromycin , Ciprofloxacin , Codeine, Demerol [meperidine], Provera [medroxyprogesterone acetate], Sulfa antibiotics, Tramadol hcl, and Percocet [oxycodone -acetaminophen ]  SH:  divorced, non smoker  Review of Systems  Constitutional: Negative.     PHYSICAL EXAMINATION:    BP 124/65   Pulse 75   Ht 5' 4 (1.626 m)   Wt 151 lb (68.5 kg)   LMP 12/05/1975   BMI 25.92 kg/m     Physical Exam Constitutional:      Appearance: Normal appearance.  Neurological:     General: No focal deficit present.     Mental Status: She is alert.  Psychiatric:        Mood and Affect: Mood normal.    Assessment/Plan: 1. Acute cystitis without hematuria (Primary) - she will return this in one week - Urine Culture; Future  2. Female cystocele - have tried pessary in the past without success.  Not interested in trying this again.   - not interested in surgical correction as she does not want anesthesia  Total time with pt and with documentation:  22 minutes

## 2023-07-21 ENCOUNTER — Other Ambulatory Visit (HOSPITAL_BASED_OUTPATIENT_CLINIC_OR_DEPARTMENT_OTHER): Payer: Self-pay

## 2023-07-26 ENCOUNTER — Other Ambulatory Visit (HOSPITAL_BASED_OUTPATIENT_CLINIC_OR_DEPARTMENT_OTHER): Payer: Self-pay | Admitting: *Deleted

## 2023-07-26 DIAGNOSIS — N3000 Acute cystitis without hematuria: Secondary | ICD-10-CM

## 2023-07-28 LAB — URINE CULTURE

## 2023-07-31 ENCOUNTER — Other Ambulatory Visit (HOSPITAL_BASED_OUTPATIENT_CLINIC_OR_DEPARTMENT_OTHER): Payer: Self-pay | Admitting: Obstetrics & Gynecology

## 2023-07-31 DIAGNOSIS — N3000 Acute cystitis without hematuria: Secondary | ICD-10-CM

## 2023-07-31 MED ORDER — CEFIXIME 400 MG PO CAPS: 400.0000 mg | ORAL_CAPSULE | Freq: Every day | ORAL | 0 refills | Status: DC

## 2023-08-12 ENCOUNTER — Other Ambulatory Visit (HOSPITAL_BASED_OUTPATIENT_CLINIC_OR_DEPARTMENT_OTHER): Payer: Medicare PPO

## 2023-08-12 ENCOUNTER — Other Ambulatory Visit (HOSPITAL_BASED_OUTPATIENT_CLINIC_OR_DEPARTMENT_OTHER): Payer: Self-pay

## 2023-08-12 DIAGNOSIS — R300 Dysuria: Secondary | ICD-10-CM

## 2023-08-12 NOTE — Progress Notes (Signed)
 Entered in Error

## 2023-08-14 LAB — URINE CULTURE

## 2023-08-16 ENCOUNTER — Encounter (HOSPITAL_BASED_OUTPATIENT_CLINIC_OR_DEPARTMENT_OTHER): Payer: Self-pay | Admitting: Obstetrics & Gynecology

## 2023-08-19 NOTE — Progress Notes (Signed)
 [facility] [address] [place] [address] Phone: [phone] Subjective:   [facility], am serving as a scribe for Dr. Antoine Primas.  I'm seeing this patient by the request  of:  [name], MD  CC: Foot pain, lumbar pain  ION:[id number]  05/25/2023 Continues to have more foot pain than anything else at this time.  Discussed icing regimen and home exercises, discussed which activities to do and which ones to avoid.  Discussed proper shoes and how to get rid of some of the callus formation noted.  Increasing activity slowly otherwise.  Follow-up again in 3 months      Update 218/2025 Ver[facility] is a 83 y.o. female coming in with complaint of loss of transverse arch, L. Patient states has some supplements for you to take a look at. Went to podiatry yesterday. Has a appointment in March for inserts.      Past Medical History:  Diagnosis Date   Complication of anesthesia    PONV   Diverticulosis    Fibromyalgia    GERD (gastroesophageal reflux disease)    Hyperlipidemia    Hypertension    IBS (irritable bowel syndrome)    Left fibular fracture 10/04/2013   MVP (mitral valve prolapse)    Patellar dislocation 10/04/2013   Left   Patellar fracture    Left   Pneumonia    Past Surgical History:  Procedure Laterality Date   APPENDECTOMY     BILATERAL SALPINGOOPHORECTOMY  07/10/1998   Endometrosis and LOA   BREAST BIOPSY Bilateral 09/2008   BREAST BIOPSY Right 1959   BREAST BIOPSY Right 1970's & 1981   CATARACT EXTRACTION Bilateral    CHOLECYSTECTOMY  02/2004   COLONOSCOPY     ear lanced  1944   EXCISION OF BREAST LESION Right 01/13/2022   Procedure: EXCISION OF RIGHT NIPPLE LESION;  Surgeon: [name], MD;  Location: [facility];  Service: General;  Laterality: Right;   HERNIA REPAIR     KNEE DISLOCATION SURGERY Left 2015   thigh lipoma Right    removed   TONSILLECTOMY     TOOTH EXTRACTION     UMBILICAL  HERNIA REPAIR  07/10/1998   at same time of Ophorectomy   VAGINAL HYSTERECTOMY  1977   Social History   Socioeconomic History   Marital status: Divorced    Spouse name: Not on file   Number of children: 3   Years of education: Not on file   Highest education level: Not on file  Occupational History   Not on file  Tobacco Use   Smoking status: Never   Smokeless tobacco: Never  Vaping Use   Vaping status: Never Used  Substance and Sexual Activity   Alcohol use: No   Drug use: No   Sexual activity: Not Currently    Partners: Male  Other Topics Concern   Not on file  Social History Narrative   Not on file   Social Drivers of Health   Financial Resource Strain: Not on file  Food Insecurity: Not on file  Transportation Needs: Not on file  Physical Activity: Not on file  Stress: Not on file  Social Connections: Not on file   Allergies  Allergen Reactions   Levaquin [Levofloxacin] Other (See Comments)    Muscle pain  Tendonitis    Augmentin [Amoxicillin-Pot Clavulanate] Nausea Only   Azithromycin Diarrhea and Nausea And Vomiting   Ciprofloxacin Nausea And Vomiting  Per Dr - told not to take due to Levaquin reation    Codeine Nausea And Vomiting   Demerol [Meperidine] Nausea And Vomiting   Provera [Medroxyprogesterone Acetate]     unknown   Sulfa Antibiotics Nausea And Vomiting   Tramadol Hcl Nausea And Vomiting   Percocet [Oxycodone-Acetaminophen] Nausea And Vomiting   Family History  Problem Relation Age of Onset   Heart failure Mother    Ulcers Mother    Hypertension Mother    Pleurisy Father    Cancer Father    Cancer Maternal Aunt        uterine cancer   Cancer Paternal Aunt        lymphoma   Diabetes Maternal Grandmother    Diverticulitis Son    Diverticulitis Daughter      Current Outpatient Medications (Cardiovascular):    hydrochlorothiazide (HYDRODIURIL) 12.5 MG tablet, Take 12.5 mg by mouth daily.   propranolol (INDERAL) 40 MG tablet, Take  40 mg by mouth 2 (two) times daily.  Current Outpatient Medications (Respiratory):    cetirizine (ZYRTEC) 10 MG tablet, Take 10 mg by mouth at bedtime.   fluticasone (FLONASE) 50 MCG/ACT nasal spray, Place 1 spray into both nostrils daily.   ipratropium (ATROVENT) 0.03 % nasal spray, Place 1-2 sprays into both nostrils as needed (congestion).   SALINE NA, Place 2 sprays into the nose as needed (congestion).   sodium chloride (OCEAN) 0.65 % nasal spray, Place 2 sprays into the nose as needed for congestion.  Current Outpatient Medications (Analgesics):    acetaminophen (TYLENOL) 500 MG tablet, Take 1,000 mg by mouth as needed for headache (body aches).  Current Outpatient Medications (Hematological):    vitamin B-12 (CYANOCOBALAMIN) 1000 MCG tablet, Take 1,000 mcg by mouth at bedtime.  Current Outpatient Medications (Other):    augmented betamethasone dipropionate (DIPROLENE-AF) 0.05 % ointment, Apply sparingly to affected area(s) twice daily   cefixime (SUPRAX) 400 MG CAPS capsule, Take 1 capsule (400 mg total) by mouth daily.   Cholecalciferol (VITAMIN D3) 2000 units TABS, Take 2,000 Units by mouth at bedtime.   hydrocortisone cream 1 %, Apply 1 Application topically as needed (insect bites).   hyoscyamine (LEVBID) 0.375 MG 12 hr tablet, Take 1 tablet by mouth 2 (two) times daily.   hyoscyamine (LEVBID) 0.375 MG 12 hr tablet, Take 1 tablet (0.375 mg total) by mouth every 12hours at breakfast and bedtime.   OVER THE COUNTER MEDICATION, Apply 1 Application topically as needed (insect bites). MelaGel   OVER THE COUNTER MEDICATION, Take 1 tablet by mouth at bedtime. Plexus Pro-Bio   POTASSIUM GLUCONATE PO, Take 99 mg by mouth in the morning and at bedtime.   Tart Cherry (TART CHERRY ULTRA) 1200 MG CAPS, Take 1,200 mg by mouth in the morning and at bedtime.   triamcinolone cream (KENALOG) 0.1 %, Apply 1 Application topically as needed (itching).   TURMERIC PO, Take 1,000 mg by mouth in the  morning and at bedtime. 1000 mg   vitamin C (ASCORBIC ACID) 500 MG tablet, Take 500 mg by mouth at bedtime.   zinc gluconate 50 MG tablet, Take 50 mg by mouth at bedtime.   Objective  Blood pressure 108/68, pulse 78, height 5\' 4"  (1.626 m), weight 152 lb (68.9 kg), last menstrual period 12/05/1975, SpO2 96%.   General: No apparent distress alert and oriented x3 mood and affect normal, dressed appropriately.  HEENT: Pupils equal, extraocular movements intact  Respiratory: Patient's speak in full sentences and does not appear  short of breath  Cardiovascular: No lower extremity edema, non tender, no erythema  Foot exam shows patient does have good range of motion of the ankles noted.  Still has the rigid midfoot noted.  Some loss of the transverse arch still noted with patient showing even protruding of the bunion and bunionette formation through the shoes.    Impression and Recommendations:      The above documentation has been reviewed and is accurate and complete Judi Saa, DO

## 2023-08-23 ENCOUNTER — Encounter: Payer: Self-pay | Admitting: Podiatry

## 2023-08-23 ENCOUNTER — Ambulatory Visit: Payer: Medicare PPO | Admitting: Podiatry

## 2023-08-23 DIAGNOSIS — M216X1 Other acquired deformities of right foot: Secondary | ICD-10-CM | POA: Diagnosis not present

## 2023-08-23 DIAGNOSIS — B351 Tinea unguium: Secondary | ICD-10-CM | POA: Diagnosis not present

## 2023-08-23 DIAGNOSIS — M216X2 Other acquired deformities of left foot: Secondary | ICD-10-CM | POA: Diagnosis not present

## 2023-08-23 DIAGNOSIS — M79675 Pain in left toe(s): Secondary | ICD-10-CM

## 2023-08-23 DIAGNOSIS — M79674 Pain in right toe(s): Secondary | ICD-10-CM

## 2023-08-23 DIAGNOSIS — Q828 Other specified congenital malformations of skin: Secondary | ICD-10-CM

## 2023-08-23 NOTE — Progress Notes (Signed)
 Subjective: Chief Complaint  Patient presents with   RFC    RM#12 RFC     83 y.o. returns the office today for painful, elongated, thickened toenails which she cannot trim herself and for calluses.  She still gets pain in the calluses and bumps of her feet.  She has previously tried some insoles which are longer helping.  No reports.  PCP: [name], MD  Objective: AAO 3, NAD DP/PT pulses palpable, CRT less than 3 seconds Nails hypertrophic, dystrophic, elongated, brittle, discolored 10. There is tenderness overlying the nails 1-5 bilaterally. There is no surrounding erythema or drainage along the nail sites. Hyperkeratotic lesions noted submetatarsal 5 bilaterally as well as submetatarsal 2, 3.  There is no underlying ulceration drainage or any signs of infection.  Not able to elicit any area pinpoint tenderness today. Hammertoes present as well as bunion.  Prominence of the metatarsal heads plantarly with atrophy of the fat pad. No pain with calf compression, swelling, warmth, erythema.  Assessment: Patient presents with symptomatic onychomycosis; hyperkeratotic lesions  Plan: -Treatment options including alternatives, risks, complications were discussed -Nails sharply debrided 10 without complication/bleeding.  Urea cream for toenails -Separate hyperkeratotic lesions x 4 any complications or bleeding as a courtesy but I recommend moisturizer, offloading.  Will have her follow-up with Bethann Berkshire, pedorthist to see about any metatarsal support to her existing orthotics or even making her an accommodative orthotic.  Return in about 9 weeks (around 10/25/2023).  Vivi Barrack DPM

## 2023-08-24 ENCOUNTER — Encounter: Payer: Self-pay | Admitting: Family Medicine

## 2023-08-24 ENCOUNTER — Ambulatory Visit: Payer: Medicare PPO | Admitting: Family Medicine

## 2023-08-24 VITALS — BP 108/68 | HR 78 | Ht 64.0 in | Wt 152.0 lb

## 2023-08-24 DIAGNOSIS — M216X9 Other acquired deformities of unspecified foot: Secondary | ICD-10-CM | POA: Diagnosis not present

## 2023-08-24 NOTE — Patient Instructions (Signed)
 Tart Cherry just 1 pill at night I think the orthotics are great idea Continue to stay active See you again in 3-4 months

## 2023-08-24 NOTE — Assessment & Plan Note (Signed)
 Patient is seeing a podiatrist.  Going to have shoes made for her.  Discussed icing regimen and home exercises otherwise.  3 to 4 months

## 2023-09-20 ENCOUNTER — Ambulatory Visit (INDEPENDENT_AMBULATORY_CARE_PROVIDER_SITE_OTHER): Payer: Medicare PPO

## 2023-09-20 DIAGNOSIS — M79676 Pain in unspecified toe(s): Secondary | ICD-10-CM

## 2023-09-20 NOTE — Progress Notes (Signed)
 Patient was present and was fit with Power steps adding MT pads that I cut down BIL  Patient will try and then return or call if she'd like a custom pr of DM inserts, Custom Accommodative or another pr of pwer steps

## 2023-09-21 DIAGNOSIS — H16223 Keratoconjunctivitis sicca, not specified as Sjogren's, bilateral: Secondary | ICD-10-CM | POA: Diagnosis not present

## 2023-09-21 DIAGNOSIS — H26493 Other secondary cataract, bilateral: Secondary | ICD-10-CM | POA: Diagnosis not present

## 2023-09-27 ENCOUNTER — Other Ambulatory Visit: Payer: Self-pay

## 2023-09-27 ENCOUNTER — Other Ambulatory Visit (HOSPITAL_BASED_OUTPATIENT_CLINIC_OR_DEPARTMENT_OTHER): Payer: Self-pay

## 2023-09-27 DIAGNOSIS — Z6825 Body mass index (BMI) 25.0-25.9, adult: Secondary | ICD-10-CM | POA: Diagnosis not present

## 2023-09-27 DIAGNOSIS — J0100 Acute maxillary sinusitis, unspecified: Secondary | ICD-10-CM | POA: Diagnosis not present

## 2023-09-27 DIAGNOSIS — I10 Essential (primary) hypertension: Secondary | ICD-10-CM | POA: Diagnosis not present

## 2023-09-27 MED ORDER — HYDROCHLOROTHIAZIDE 12.5 MG PO TABS
12.5000 mg | ORAL_TABLET | Freq: Every morning | ORAL | 1 refills | Status: DC
  Filled 2023-09-27: qty 90, 90d supply, fill #0

## 2023-09-27 MED ORDER — AMOXICILLIN-POT CLAVULANATE 875-125 MG PO TABS
1.0000 | ORAL_TABLET | Freq: Two times a day (BID) | ORAL | 0 refills | Status: AC
  Filled 2023-09-27: qty 14, 7d supply, fill #0

## 2023-09-27 MED ORDER — PROPRANOLOL HCL 40 MG PO TABS
40.0000 mg | ORAL_TABLET | Freq: Two times a day (BID) | ORAL | 1 refills | Status: DC
  Filled 2023-09-27: qty 180, 90d supply, fill #0

## 2023-10-20 ENCOUNTER — Other Ambulatory Visit (HOSPITAL_BASED_OUTPATIENT_CLINIC_OR_DEPARTMENT_OTHER): Payer: Self-pay

## 2023-10-20 DIAGNOSIS — H18413 Arcus senilis, bilateral: Secondary | ICD-10-CM | POA: Diagnosis not present

## 2023-10-20 DIAGNOSIS — H26491 Other secondary cataract, right eye: Secondary | ICD-10-CM | POA: Diagnosis not present

## 2023-10-20 DIAGNOSIS — H26493 Other secondary cataract, bilateral: Secondary | ICD-10-CM | POA: Diagnosis not present

## 2023-10-20 DIAGNOSIS — Z961 Presence of intraocular lens: Secondary | ICD-10-CM | POA: Diagnosis not present

## 2023-10-20 DIAGNOSIS — H02831 Dermatochalasis of right upper eyelid: Secondary | ICD-10-CM | POA: Diagnosis not present

## 2023-10-20 MED ORDER — PREDNISOLONE ACETATE 1 % OP SUSP
1.0000 [drp] | OPHTHALMIC | 0 refills | Status: AC
Start: 2023-10-20 — End: ?
  Filled 2023-10-20: qty 5, 25d supply, fill #0

## 2023-10-22 ENCOUNTER — Other Ambulatory Visit (HOSPITAL_BASED_OUTPATIENT_CLINIC_OR_DEPARTMENT_OTHER): Payer: Self-pay

## 2023-10-22 ENCOUNTER — Other Ambulatory Visit: Payer: Self-pay

## 2023-10-22 MED ORDER — HYOSCYAMINE SULFATE ER 0.375 MG PO TB12
0.3750 mg | ORAL_TABLET | Freq: Two times a day (BID) | ORAL | 3 refills | Status: DC
  Filled 2023-10-22: qty 180, 90d supply, fill #0

## 2023-10-25 ENCOUNTER — Encounter: Payer: Self-pay | Admitting: Podiatry

## 2023-10-25 ENCOUNTER — Ambulatory Visit: Payer: Medicare PPO | Admitting: Podiatry

## 2023-10-25 DIAGNOSIS — M216X1 Other acquired deformities of right foot: Secondary | ICD-10-CM

## 2023-10-25 DIAGNOSIS — Q828 Other specified congenital malformations of skin: Secondary | ICD-10-CM

## 2023-10-25 DIAGNOSIS — B351 Tinea unguium: Secondary | ICD-10-CM | POA: Diagnosis not present

## 2023-10-25 DIAGNOSIS — M79675 Pain in left toe(s): Secondary | ICD-10-CM | POA: Diagnosis not present

## 2023-10-25 DIAGNOSIS — M216X2 Other acquired deformities of left foot: Secondary | ICD-10-CM

## 2023-10-25 DIAGNOSIS — M79674 Pain in right toe(s): Secondary | ICD-10-CM

## 2023-10-25 NOTE — Progress Notes (Signed)
 Subjective: Chief Complaint  Patient presents with   RFC    RM#13 RFC patient has no other concerns at this time.     83 y.o. returns the office today for painful, elongated, thickened toenails which she cannot trim herself and for calluses.  States that recently the calluses and the pain in the ball of her foot has gotten more uncomfortable.  No open lesions or any recent injuries.   PCP: Jimmey Mould, MD  Objective: AAO 3, NAD DP/PT pulses palpable, CRT less than 3 seconds Nails hypertrophic, dystrophic, elongated, brittle, discolored 10. There is tenderness overlying the nails 1-5 bilaterally. There is no surrounding erythema or drainage along the nail sites. Hyperkeratotic lesions noted submetatarsal 5 bilaterally as well as submetatarsal 1 on the left foot and the left 2nd toe  There is no underlying ulceration drainage or any signs of infection.  Not able to elicit any area pinpoint tenderness today. Hammertoes present as well as bunion.  Prominence of the metatarsal heads plantarly with atrophy of the fat pad. No pain with calf compression, swelling, warmth, erythema.  Assessment: Patient presents with symptomatic onychomycosis; hyperkeratotic lesions  Plan: Symptomatic onychomycosis -Nails sharply debrided 10 without complication/bleeding.  Urea  cream for toenails  Metatarsalgia, hyperkeratotic lesions -Discussed offloading.  Continue with supportive shoes, inserts with offloading. - Sharply hyperkeratotic lesions x 4 any complications or bleeding as a courtesy but I recommend moisturizer, offloading.    Return in about 2 months (around 12/25/2023).  Charity Conch DPM

## 2023-10-29 ENCOUNTER — Other Ambulatory Visit (HOSPITAL_BASED_OUTPATIENT_CLINIC_OR_DEPARTMENT_OTHER): Payer: Self-pay

## 2023-11-09 ENCOUNTER — Other Ambulatory Visit (HOSPITAL_BASED_OUTPATIENT_CLINIC_OR_DEPARTMENT_OTHER): Payer: Self-pay

## 2023-11-09 DIAGNOSIS — Z6825 Body mass index (BMI) 25.0-25.9, adult: Secondary | ICD-10-CM | POA: Diagnosis not present

## 2023-11-09 DIAGNOSIS — Z0000 Encounter for general adult medical examination without abnormal findings: Secondary | ICD-10-CM | POA: Diagnosis not present

## 2023-11-09 DIAGNOSIS — R309 Painful micturition, unspecified: Secondary | ICD-10-CM | POA: Diagnosis not present

## 2023-11-09 DIAGNOSIS — I10 Essential (primary) hypertension: Secondary | ICD-10-CM | POA: Diagnosis not present

## 2023-11-09 DIAGNOSIS — R7301 Impaired fasting glucose: Secondary | ICD-10-CM | POA: Diagnosis not present

## 2023-11-09 DIAGNOSIS — K589 Irritable bowel syndrome without diarrhea: Secondary | ICD-10-CM | POA: Diagnosis not present

## 2023-11-09 DIAGNOSIS — E781 Pure hyperglyceridemia: Secondary | ICD-10-CM | POA: Diagnosis not present

## 2023-11-09 MED ORDER — HYDROCHLOROTHIAZIDE 12.5 MG PO TABS
12.5000 mg | ORAL_TABLET | Freq: Every morning | ORAL | 4 refills | Status: AC
  Filled 2023-11-09 – 2023-12-27 (×4): qty 90, 90d supply, fill #0
  Filled 2024-04-14: qty 90, 90d supply, fill #1
  Filled 2024-07-07: qty 90, 90d supply, fill #2

## 2023-11-09 MED ORDER — PROPRANOLOL HCL 40 MG PO TABS
40.0000 mg | ORAL_TABLET | Freq: Two times a day (BID) | ORAL | 4 refills | Status: AC
  Filled 2023-11-09 – 2023-12-27 (×4): qty 180, 90d supply, fill #0
  Filled 2024-04-14: qty 180, 90d supply, fill #1
  Filled 2024-07-07: qty 180, 90d supply, fill #2

## 2023-11-09 MED ORDER — HYOSCYAMINE SULFATE ER 0.375 MG PO TB12
0.3750 mg | ORAL_TABLET | Freq: Two times a day (BID) | ORAL | 4 refills | Status: AC
  Filled 2023-11-09 – 2024-01-26 (×3): qty 180, 90d supply, fill #0
  Filled 2024-04-26: qty 149, 75d supply, fill #1
  Filled 2024-04-27: qty 31, 15d supply, fill #1
  Filled 2024-08-07: qty 6, 3d supply, fill #2
  Filled 2024-08-07: qty 180, 90d supply, fill #2

## 2023-11-22 NOTE — Progress Notes (Signed)
 [facility] [address] [place] [address] Phone: [phone] Subjective:   Delwyn Filippo, am serving as a scribe for Dr. Ronnell Coins.  I'm seeing this patient by the request  of:  Jimmey Mould, MD  CC: Right knee pain being the worst  [id number]  08/24/2023 Patient is seeing a podiatrist. Going to have shoes made for her. Discussed icing regimen and home exercises otherwise. 3 to 4 months   Update 11/23/2023 Ada Holness is a 83 y.o. female coming in with complaint of B foot pain. Patient states that she has good and bad days. Using toe spacers which have taken some pressure off of the L 1st MTP joint.   Also c/o increase in R knee pain in past week. Using topical analgesics for pain relief. Continues to have swelling in R leg down to the ankle.   C/o cramping in B calves at night and in joints of the foot. Occurring nightly for past 2 months.        Past Medical History:  Diagnosis Date   Complication of anesthesia    PONV   Diverticulosis    Fibromyalgia    GERD (gastroesophageal reflux disease)    Hyperlipidemia    Hypertension    IBS (irritable bowel syndrome)    Left fibular fracture 10/04/2013   MVP (mitral valve prolapse)    Patellar dislocation 10/04/2013   Left   Patellar fracture    Left   Pneumonia    Past Surgical History:  Procedure Laterality Date   APPENDECTOMY     BILATERAL SALPINGOOPHORECTOMY  07/10/1998   Endometrosis and LOA   BREAST BIOPSY Bilateral 09/2008   BREAST BIOPSY Right 1959   BREAST BIOPSY Right 1970's & 1981   CATARACT EXTRACTION Bilateral    CHOLECYSTECTOMY  02/2004   COLONOSCOPY     ear lanced  1944   EXCISION OF BREAST LESION Right 01/13/2022   Procedure: EXCISION OF RIGHT NIPPLE LESION;  Surgeon: Oza Blumenthal, MD;  Location: [facility];  Service: General;  Laterality: Right;   HERNIA REPAIR     KNEE DISLOCATION SURGERY Left 2015   thigh lipoma Right    removed    TONSILLECTOMY     TOOTH EXTRACTION     UMBILICAL HERNIA REPAIR  07/10/1998   at same time of Ophorectomy   VAGINAL HYSTERECTOMY  1977   Social History   Socioeconomic History   Marital status: Divorced    Spouse name: Not on file   Number of children: 3   Years of education: Not on file   Highest education level: Not on file  Occupational History   Not on file  Tobacco Use   Smoking status: Never   Smokeless tobacco: Never  Vaping Use   Vaping status: Never Used  Substance and Sexual Activity   Alcohol use: No   Drug use: No   Sexual activity: Not Currently    Partners: Male  Other Topics Concern   Not on file  Social History Narrative   Not on file   Social Drivers of Health   Financial Resource Strain: Not on file  Food Insecurity: Not on file  Transportation Needs: Not on file  Physical Activity: Not on file  Stress: Not on file  Social Connections: Not on file   Allergies  Allergen Reactions   Levaquin [Levofloxacin] Other (See Comments)    Muscle pain  Tendonitis    Augmentin  [Amoxicillin -Pot Clavulanate] Nausea Only  Azithromycin  Diarrhea and Nausea And Vomiting   Ciprofloxacin  Nausea And Vomiting    Per Dr - told not to take due to Levaquin reation    Codeine Nausea And Vomiting   Demerol [Meperidine] Nausea And Vomiting   Provera [Medroxyprogesterone Acetate]     unknown   Sulfa Antibiotics Nausea And Vomiting   Tramadol Hcl Nausea And Vomiting   Percocet [Oxycodone -Acetaminophen ] Nausea And Vomiting   Family History  Problem Relation Age of Onset   Heart failure Mother    Ulcers Mother    Hypertension Mother    Pleurisy Father    Cancer Father    Cancer Maternal Aunt        uterine cancer   Cancer Paternal Aunt        lymphoma   Diabetes Maternal Grandmother    Diverticulitis Son    Diverticulitis Daughter      Current Outpatient Medications (Cardiovascular):    hydrochlorothiazide  (HYDRODIURIL ) 12.5 MG tablet, Take 1 tablet (12.5  mg total) by mouth every morning.   propranolol  (INDERAL ) 40 MG tablet, Take 1 tablet (40 mg total) by mouth 2 (two) times daily.  Current Outpatient Medications (Respiratory):    cetirizine (ZYRTEC) 10 MG tablet, Take 10 mg by mouth at bedtime.   fluticasone (FLONASE) 50 MCG/ACT nasal spray, Place 1 spray into both nostrils daily.   ipratropium (ATROVENT) 0.03 % nasal spray, Place 1-2 sprays into both nostrils as needed (congestion).   SALINE NA, Place 2 sprays into the nose as needed (congestion).   sodium chloride  (OCEAN) 0.65 % nasal spray, Place 2 sprays into the nose as needed for congestion.  Current Outpatient Medications (Analgesics):    acetaminophen  (TYLENOL ) 500 MG tablet, Take 1,000 mg by mouth as needed for headache (body aches).  Current Outpatient Medications (Hematological):    vitamin B-12 (CYANOCOBALAMIN) 1000 MCG tablet, Take 1,000 mcg by mouth at bedtime.  Current Outpatient Medications (Other):    amoxicillin -clavulanate (AUGMENTIN ) 875-125 MG tablet, Take 1 tablet by mouth 2 (two) times daily.   augmented betamethasone  dipropionate (DIPROLENE -AF) 0.05 % ointment, Apply sparingly to affected area(s) twice daily   Cholecalciferol (VITAMIN D3) 2000 units TABS, Take 2,000 Units by mouth at bedtime.   hydrocortisone cream 1 %, Apply 1 Application topically as needed (insect bites).   hyoscyamine  (LEVBID) 0.375 MG 12 hr tablet, Take 1 tablet (0.375 mg total) by mouth every 12 (twelve) hours at breakfast and bedtime.   OVER THE COUNTER MEDICATION, Apply 1 Application topically as needed (insect bites). MelaGel   POTASSIUM GLUCONATE PO, Take 99 mg by mouth in the morning and at bedtime.   prednisoLONE  acetate (PRED FORTE ) 1 % ophthalmic suspension, Place 1 drop into the affected eye four times daily as directed.   Tart Cherry (TART CHERRY ULTRA) 1200 MG CAPS, Take 1,200 mg by mouth in the morning and at bedtime.   triamcinolone  cream (KENALOG) 0.1 %, Apply 1 Application  topically as needed (itching).   TURMERIC PO, Take 1,000 mg by mouth in the morning and at bedtime. 1000 mg   vitamin C (ASCORBIC ACID) 500 MG tablet, Take 500 mg by mouth at bedtime.   zinc gluconate 50 MG tablet, Take 50 mg by mouth at bedtime.   Reviewed prior external information including notes and imaging from  primary care provider As well as notes that were available from care everywhere and other healthcare systems.  Past medical history, social, surgical and family history all reviewed in electronic medical record.  No pertanent  information unless stated regarding to the chief complaint.   Review of Systems:  No headache, visual changes, nausea, vomiting, diarrhea, constipation, dizziness, abdominal pain, skin rash, fevers, chills, night sweats, weight loss, swollen lymph nodes, body aches, joint swelling, chest pain, shortness of breath, mood changes. POSITIVE muscle aches has been snoring recently and having pain at night  Objective  Blood pressure 110/82, pulse (!) 49, height 5\' 4"  (1.626 m), weight 151 lb (68.5 kg), last menstrual period 12/05/1975, SpO2 98%.   General: No apparent distress alert and oriented x3 mood and affect normal, dressed appropriately.  HEENT: Pupils equal, extraocular movements intact  Respiratory: Patient's speak in full sentences and does not appear short of breath  Cardiovascular: Mild edema Severe arthritic changes noted of the knee.  Right knee does have instability noted with valgus and varus force.  Patient does have crepitus with range of motion.  Tender to palpation mostly over the lateral joint line.  Limited muscular skeletal ultrasound was performed and interpreted by Ronnell Coins, M  Limited ultrasound of patient's right knee shows severe near on bone-on-bone osteoarthritic changes of the patellofemoral and the lateral compartment.  Some mild to moderate arthritic changes of the medial compartment. 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 Right knee was prepped with alcohol swab and utilizing anterior medial approach, patient's right knee space was injected with 4:1  marcaine  0.5%: Kenalog 40mg /dL. Patient tolerated the procedure well without immediate complications.    Impression and Recommendations:    The above documentation has been reviewed and is accurate and complete Able Malloy M Hareem Surowiec, DO

## 2023-11-23 ENCOUNTER — Ambulatory Visit: Payer: Medicare PPO | Admitting: Family Medicine

## 2023-11-23 ENCOUNTER — Encounter: Payer: Self-pay | Admitting: Family Medicine

## 2023-11-23 ENCOUNTER — Other Ambulatory Visit (HOSPITAL_BASED_OUTPATIENT_CLINIC_OR_DEPARTMENT_OTHER): Payer: Self-pay

## 2023-11-23 ENCOUNTER — Other Ambulatory Visit: Payer: Self-pay

## 2023-11-23 ENCOUNTER — Ambulatory Visit (INDEPENDENT_AMBULATORY_CARE_PROVIDER_SITE_OTHER)

## 2023-11-23 VITALS — BP 110/82 | HR 49 | Ht 64.0 in | Wt 151.0 lb

## 2023-11-23 DIAGNOSIS — M79671 Pain in right foot: Secondary | ICD-10-CM

## 2023-11-23 DIAGNOSIS — N390 Urinary tract infection, site not specified: Secondary | ICD-10-CM | POA: Diagnosis not present

## 2023-11-23 DIAGNOSIS — M25561 Pain in right knee: Secondary | ICD-10-CM | POA: Diagnosis not present

## 2023-11-23 DIAGNOSIS — R0683 Snoring: Secondary | ICD-10-CM

## 2023-11-23 DIAGNOSIS — M79672 Pain in left foot: Secondary | ICD-10-CM | POA: Diagnosis not present

## 2023-11-23 DIAGNOSIS — M25461 Effusion, right knee: Secondary | ICD-10-CM | POA: Diagnosis not present

## 2023-11-23 DIAGNOSIS — M1711 Unilateral primary osteoarthritis, right knee: Secondary | ICD-10-CM

## 2023-11-23 MED ORDER — AMOXICILLIN-POT CLAVULANATE 875-125 MG PO TABS
1.0000 | ORAL_TABLET | Freq: Two times a day (BID) | ORAL | 0 refills | Status: DC
Start: 2023-11-23 — End: 2023-12-27
  Filled 2023-11-23: qty 14, 7d supply, fill #0

## 2023-11-23 NOTE — Assessment & Plan Note (Signed)
 Has had difficulty with recurrent UTIs.  Has responded best she states to Augmentin .  We discussed a urinalysis but patient states that she has difficulty urinating during the day.  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  Patient knows to follow-up with primary care provider or seek medical attention immediately if no significant response.

## 2023-11-23 NOTE — Patient Instructions (Addendum)
 Pulmonology will call for sleep study Injected knee today Xray today Augumentin 2x a day 7 days See me again in 3 months

## 2023-11-23 NOTE — Assessment & Plan Note (Signed)
 Sent for sleep study secondary to the snoring.  Follow-up again in 6 to 8 weeks

## 2023-11-23 NOTE — Assessment & Plan Note (Signed)
 Patient given injection and tolerated the procedure well, discussed icing regimen of home exercises, which activities to do and which ones to avoid.  Increase activity slowly.  Do think patient could be a candidate for viscosupplementation.  Has 2 kids who have already had replacements.  Patient is concerned that this is in her future.  Follow-up again in 8 weeks

## 2023-11-24 ENCOUNTER — Other Ambulatory Visit: Payer: Self-pay

## 2023-11-24 ENCOUNTER — Encounter: Payer: Self-pay | Admitting: Family Medicine

## 2023-11-24 ENCOUNTER — Other Ambulatory Visit (HOSPITAL_BASED_OUTPATIENT_CLINIC_OR_DEPARTMENT_OTHER): Payer: Self-pay

## 2023-11-24 DIAGNOSIS — Z6825 Body mass index (BMI) 25.0-25.9, adult: Secondary | ICD-10-CM | POA: Diagnosis not present

## 2023-11-24 DIAGNOSIS — S61412A Laceration without foreign body of left hand, initial encounter: Secondary | ICD-10-CM | POA: Diagnosis not present

## 2023-11-24 MED ORDER — DOXYCYCLINE HYCLATE 100 MG PO TABS
100.0000 mg | ORAL_TABLET | Freq: Two times a day (BID) | ORAL | 0 refills | Status: AC
  Filled 2023-11-24: qty 14, 7d supply, fill #0

## 2023-11-26 ENCOUNTER — Other Ambulatory Visit (HOSPITAL_BASED_OUTPATIENT_CLINIC_OR_DEPARTMENT_OTHER): Payer: Self-pay

## 2023-11-29 ENCOUNTER — Ambulatory Visit: Payer: Self-pay | Admitting: Family Medicine

## 2023-12-10 ENCOUNTER — Ambulatory Visit (INDEPENDENT_AMBULATORY_CARE_PROVIDER_SITE_OTHER): Admitting: Nurse Practitioner

## 2023-12-10 ENCOUNTER — Encounter: Payer: Self-pay | Admitting: Nurse Practitioner

## 2023-12-10 VITALS — BP 140/82 | HR 59 | Ht 65.0 in | Wt 150.4 lb

## 2023-12-10 DIAGNOSIS — G4700 Insomnia, unspecified: Secondary | ICD-10-CM

## 2023-12-10 DIAGNOSIS — R0683 Snoring: Secondary | ICD-10-CM

## 2023-12-10 NOTE — Patient Instructions (Signed)
 Given your symptoms, I am concerned that you may have sleep disordered breathing with sleep apnea. You will need a sleep study for further evaluation. Someone will contact you to schedule this.   We discussed how untreated sleep apnea puts an individual at risk for cardiac arrhthymias, pulm HTN, DM, stroke and increases their risk for daytime accidents. We also briefly reviewed treatment options including weight loss, side sleeping position, oral appliance, CPAP therapy or referral to ENT for possible surgical options  Use caution when driving and pull over if you become sleepy.  Follow up in 6-8 weeks with Katie Caterra Ostroff,NP to go over sleep study results, or sooner, if needed. Friday PM virtual clinic preferred

## 2023-12-10 NOTE — Assessment & Plan Note (Signed)
 Sleep onset. Sleep hygiene reviewed. See above

## 2023-12-10 NOTE — Progress Notes (Signed)
 @Patient  ID: Veronica Franklin, female    DOB: 07-28-1940, 83 y.o.   MRN: [id number]  Chief Complaint  Patient presents with   Consult    No history of sleep apnea    Referring provider: Isidro Margo, DO  HPI: 83 year old female, never smoker referred for sleep consult. Past medical history significant for fibromyalgia, MVP, allergic rhinitis, fatty liver, IBS.   TEST/EVENTS:   12/10/2023: Today - sleep consult Discussed the use of AI scribe software for clinical note transcription with the patient, who gave verbal consent to proceed.  History of Present Illness   Veronica Franklin is an 83 year old female who presents with sleep disturbances and snoring.  She experiences snoring, which her children have observed, although friends who have shared a room with her have not. No episodes of waking herself up snoring, gasping, choking, or coughing. She has difficulty falling asleep, often not going to bed until 2 AM, but generally stays asleep once she does. She attributes this to her fibromyalgia. She sets her alarm for 8:30 AM and occasionally wakes up earlier. Despite the late bedtime, she does not feel tired during the day and reports good energy levels. She does not use any sleep aids and denies drowsy driving, morning headaches, sleepwalking, or sleep paralysis.  She was diagnosed with fibromyalgia in the late 1980s. Wearing socks at night has actually helped her fall asleep more easily.  She does not consume alcohol and avoids caffeine due to breast tumors (not cancerous), opting for caffeine-free beverages like Fanta and occasionally decaffeinated coffee and tea. She is primarily a side sleeper, often on her left side, which is more comfortable. Her dog sleeps with her and changes positions throughout the night.  Family history is notable for her oldest son, who was diagnosed with sleep apnea 20-25 years ago. He uses a CPAP machine and has a history of obesity, having undergone  gastric bypass surgery.   She is retired. Lives alone but her son stays with her four nights out of the week. No significant weight change over the last two years.  Epworth 6      Allergies  Allergen Reactions   Levaquin [Levofloxacin] Other (See Comments)    Muscle pain  Tendonitis    Augmentin  [Amoxicillin -Pot Clavulanate] Nausea Only   Azithromycin  Diarrhea and Nausea And Vomiting   Ciprofloxacin  Nausea And Vomiting    Per Dr - told not to take due to Levaquin reation    Codeine Nausea And Vomiting   Demerol [Meperidine] Nausea And Vomiting   Provera [Medroxyprogesterone Acetate]     unknown   Sulfa Antibiotics Nausea And Vomiting   Tramadol Hcl Nausea And Vomiting   Percocet [Oxycodone -Acetaminophen ] Nausea And Vomiting    Immunization History  Administered Date(s) Administered   DTaP 12/13/2010   Fluad Quad(high Dose 65+) 05/10/2019   Influenza Split 05/01/2013, 05/17/2020   Influenza, High Dose Seasonal PF 05/06/2018   Influenza,inj,Quad PF,6+ Mos 06/16/2017   Influenza,inj,quad, With Preservative 03/08/2015, 03/12/2016   PFIZER(Purple Top)SARS-COV-2 Vaccination 09/08/2019, 09/29/2019, 05/17/2020   Pneumococcal Conjugate-13 01/25/2014   Tdap 12/13/2010, 10/03/2020    Past Medical History:  Diagnosis Date   Complication of anesthesia    PONV   Diverticulosis    Fibromyalgia    GERD (gastroesophageal reflux disease)    Hyperlipidemia    Hypertension    IBS (irritable bowel syndrome)    Left fibular fracture 10/04/2013   MVP (mitral valve prolapse)  Patellar dislocation 10/04/2013   Left   Patellar fracture    Left   Pneumonia     Tobacco History: Social History   Tobacco Use  Smoking Status Never  Smokeless Tobacco Never   Counseling given: Not Answered   Outpatient Medications Prior to Visit  Medication Sig Dispense Refill   acetaminophen  (TYLENOL ) 500 MG tablet Take 1,000 mg by mouth as needed for headache (body aches).      amoxicillin -clavulanate (AUGMENTIN ) 875-125 MG tablet Take 1 tablet by mouth 2 (two) times daily. 14 tablet 0   augmented betamethasone  dipropionate (DIPROLENE -AF) 0.05 % ointment Apply sparingly to affected area(s) twice daily 50 g 2   cetirizine (ZYRTEC) 10 MG tablet Take 10 mg by mouth at bedtime.     Cholecalciferol (VITAMIN D3) 2000 units TABS Take 2,000 Units by mouth at bedtime.     fluticasone (FLONASE) 50 MCG/ACT nasal spray Place 1 spray into both nostrils daily.     hydrochlorothiazide  (HYDRODIURIL ) 12.5 MG tablet Take 1 tablet (12.5 mg total) by mouth every morning. 90 tablet 4   hydrocortisone cream 1 % Apply 1 Application topically as needed (insect bites).     hyoscyamine  (LEVBID) 0.375 MG 12 hr tablet Take 1 tablet (0.375 mg total) by mouth every 12 (twelve) hours at breakfast and bedtime. 180 tablet 4   ipratropium (ATROVENT) 0.03 % nasal spray Place 1-2 sprays into both nostrils as needed (congestion).     OVER THE COUNTER MEDICATION Apply 1 Application topically as needed (insect bites). MelaGel     POTASSIUM GLUCONATE PO Take 99 mg by mouth in the morning and at bedtime.     prednisoLONE  acetate (PRED FORTE ) 1 % ophthalmic suspension Place 1 drop into the affected eye four times daily as directed. 5 mL 0   propranolol  (INDERAL ) 40 MG tablet Take 1 tablet (40 mg total) by mouth 2 (two) times daily. 180 tablet 4   SALINE NA Place 2 sprays into the nose as needed (congestion).     sodium chloride  (OCEAN) 0.65 % nasal spray Place 2 sprays into the nose as needed for congestion.     Tart Cherry (TART CHERRY ULTRA) 1200 MG CAPS Take 1,200 mg by mouth in the morning and at bedtime.     triamcinolone  cream (KENALOG) 0.1 % Apply 1 Application topically as needed (itching).  1   TURMERIC PO Take 1,000 mg by mouth in the morning and at bedtime. 1000 mg     vitamin B-12 (CYANOCOBALAMIN) 1000 MCG tablet Take 1,000 mcg by mouth at bedtime.     vitamin C (ASCORBIC ACID) 500 MG tablet Take 500  mg by mouth at bedtime.     XDEMVY 0.25 % SOLN      zinc gluconate 50 MG tablet Take 50 mg by mouth at bedtime.     No facility-administered medications prior to visit.     Review of Systems:   Constitutional: No weight loss or gain, night sweats, fevers, chills, fatigue, or lassitude. HEENT: No headaches, difficulty swallowing, tooth/dental problems, or sore throat. No sneezing, itching, ear ache, nasal congestion, or post nasal drip CV:  No chest pain, orthopnea, PND, swelling in lower extremities, anasarca, dizziness, palpitations, syncope Resp: +snoring. No shortness of breath with exertion or at rest. No cough GI:  No heartburn, indigestio  GU: No nocturia  Skin: No rash, lesions, ulcerations MSK:  No joint pain or swelling.  Neuro: No dizziness or lightheadedness.  Psych: No depression or anxiety. Mood stable. +sleep  disturbance    Physical Exam:  BP (!) 140/82 (BP Location: Right Arm, Patient Position: Sitting, Cuff Size: Normal)   Pulse (!) 59   Ht 5\' 5"  (1.651 m)   Wt 150 lb 6.4 oz (68.2 kg)   LMP 12/05/1975   SpO2 98%   BMI 25.03 kg/m   GEN: Pleasant, interactive, well-appearing; in no acute distress. HEENT:  Normocephalic and atraumatic. PERRLA. Sclera white. Nasal turbinates pink, moist and patent bilaterally. No rhinorrhea present. Oropharynx pink and moist, without exudate or edema. No lesions, ulcerations, or postnasal drip. Mallampati II. Absent tonsils NECK:  Supple w/ fair ROM. No JVD present. Thyroid symmetrical with no goiter or nodules palpated. No lymphadenopathy.   CV: RRR, no m/r/g, no peripheral edema. Pulses intact, +2 bilaterally. No cyanosis, pallor or clubbing. PULMONARY:  Unlabored, regular breathing. Clear bilaterally A&P w/o wheezes/rales/rhonchi. No accessory muscle use.  GI: BS present and normoactive. Soft, non-tender to palpation. No organomegaly or masses detected. MSK: No erythema, warmth or tenderness. Cap refil <2 sec all extrem. No  deformities or joint swelling noted.  Neuro: A/Ox3. No focal deficits noted.   Skin: Warm, no lesions or rashe Psych: Normal affect and behavior. Judgement and thought content appropriate.     Lab Results:  CBC    Component Value Date/Time   WBC 5.4 01/07/2022 1002   RBC 4.72 01/07/2022 1002   HGB 14.4 01/07/2022 1002   HCT 43.5 01/07/2022 1002   PLT 193 01/07/2022 1002   MCV 92.2 01/07/2022 1002   MCH 30.5 01/07/2022 1002   MCHC 33.1 01/07/2022 1002   RDW 13.1 01/07/2022 1002    BMET    Component Value Date/Time   NA 142 01/07/2022 1002   K 3.9 01/07/2022 1002   CL 107 01/07/2022 1002   CO2 27 01/07/2022 1002   GLUCOSE 103 (H) 01/07/2022 1002   BUN 13 01/07/2022 1002   CREATININE 0.64 01/07/2022 1002   CALCIUM 9.8 01/07/2022 1002   GFRNONAA >60 01/07/2022 1002    BNP No results found for: "BNP"   Imaging:  DG Knee AP/LAT W/Sunrise Right Result Date: 11/28/2023 CLINICAL DATA:  Worsening right knee pain for the last few weeks. EXAM: RIGHT KNEE 3 VIEWS COMPARISON:  Radiograph 03/12/2020 FINDINGS: The bones are subjectively under mineralized. There is mild lateral translation of the patella with respect to the trochlear groove. Slightly shallow trochlear angle. Peripheral spurring is most prominent in the patellofemoral compartment. No fracture, erosion, or focal bone abnormality. Trace joint effusion. IMPRESSION: 1. 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 2. Mild lateral translation of the patella with respect to the trochlear groove, can be seen with patellar maltracking. 3. Trace joint effusion. Electronically Signed   By: Chadwick Colonel M.D.   On: 11/28/2023 17:46   US  LIMITED JOINT SPACE STRUCTURES LOW BILAT(NO LINKED CHARGES) Result Date: 11/26/2023 Limited muscular skeletal ultrasound was performed and interpreted by Ronnell Coins, M Limited ultrasound of patient's right knee shows severe near on bone-on-bone osteoarthritic changes  of the patellofemoral and the lateral compartment.  Some mild to moderate arthritic changes of the medial compartment. 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 BMI 25. Family History of  OSA. Given this,  I am concerned she could have sleep disordered breathing with obstructive sleep apnea. She will need sleep study for further evaluation.    - discussed how weight can impact sleep and risk for sleep disordered breathing - discussed options to assist with weight loss: combination of diet modification, cardiovascular and strength training exercises   - had an extensive discussion regarding the adverse health consequences related to untreated sleep disordered breathing - specifically discussed the risks for hypertension, coronary artery disease, cardiac dysrhythmias, cerebrovascular disease, and diabetes - lifestyle modification discussed   - discussed how sleep disruption can increase risk of accidents, particularly when driving - safe driving practices were discussed  Patient Instructions  Given your symptoms, I am concerned that you may have sleep disordered breathing with sleep apnea. You will need a sleep study for further evaluation. Someone will contact you to schedule this.   We discussed how untreated sleep apnea puts an individual at risk for cardiac arrhthymias, pulm HTN, DM, stroke and increases their risk for daytime accidents. We also briefly reviewed treatment options including weight loss, side sleeping position, oral appliance, CPAP therapy or referral to ENT for possible surgical options  Use caution when driving and pull over if you become sleepy.  Follow up in 6-8 weeks with Katie Tehilla Coffel,NP to go over sleep study results, or sooner, if needed. Friday PM virtual  clinic preferred       Insomnia Sleep onset. Sleep hygiene reviewed. See above  Advised if symptoms do not improve or worsen, to please contact office for sooner follow up or seek emergency care.   I spent 35 minutes of dedicated to the care of this patient on the date of this encounter to include pre-visit review of records, face-to-face time with the patient discussing conditions above, post visit ordering of testing, clinical documentation with the electronic health record, making appropriate referrals as documented, and communicating necessary findings to members of the patients care team.  Roetta Clarke, NP 12/10/2023  Pt aware and understands NP's role.

## 2023-12-10 NOTE — Progress Notes (Deleted)
 @Patient  ID: Veronica Franklin, female    DOB: [birth year], 83 y.o.   MRN: [id number]  Chief Complaint  Patient presents with   Consult    No history of sleep apnea    Referring provider: Isidro Margo, DO  HPI:   TEST/EVENTS:   Allergies  Allergen Reactions   Levaquin [Levofloxacin] Other (See Comments)    Muscle pain  Tendonitis    Augmentin  [Amoxicillin -Pot Clavulanate] Nausea Only   Azithromycin  Diarrhea and Nausea And Vomiting   Ciprofloxacin  Nausea And Vomiting    Per Dr - told not to take due to Levaquin reation    Codeine Nausea And Vomiting   Demerol [Meperidine] Nausea And Vomiting   Provera [Medroxyprogesterone Acetate]     unknown   Sulfa Antibiotics Nausea And Vomiting   Tramadol Hcl Nausea And Vomiting   Percocet [Oxycodone -Acetaminophen ] Nausea And Vomiting    Immunization History  Administered Date(s) Administered   DTaP 12/13/2010   Fluad Quad(high Dose 65+) 05/10/2019   Influenza Split 05/01/2013, 05/17/2020   Influenza, High Dose Seasonal PF 05/06/2018   Influenza,inj,Quad PF,6+ Mos 06/16/2017   Influenza,inj,quad, With Preservative 03/08/2015, 03/12/2016   PFIZER(Purple Top)SARS-COV-2 Vaccination 09/08/2019, 09/29/2019, 05/17/2020   Pneumococcal Conjugate-13 01/25/2014   Tdap 12/13/2010, 10/03/2020    Past Medical History:  Diagnosis Date   Complication of anesthesia    PONV   Diverticulosis    Fibromyalgia    GERD (gastroesophageal reflux disease)    Hyperlipidemia    Hypertension    IBS (irritable bowel syndrome)    Left fibular fracture 10/04/2013   MVP (mitral valve prolapse)    Patellar dislocation 10/04/2013   Left   Patellar fracture    Left   Pneumonia     Tobacco History: Social History   Tobacco Use  Smoking Status Never  Smokeless Tobacco Never   Counseling given: Not Answered   Outpatient Medications Prior to Visit  Medication Sig Dispense Refill   acetaminophen  (TYLENOL ) 500 MG tablet Take 1,000 mg by  mouth as needed for headache (body aches).     amoxicillin -clavulanate (AUGMENTIN ) 875-125 MG tablet Take 1 tablet by mouth 2 (two) times daily. 14 tablet 0   augmented betamethasone  dipropionate (DIPROLENE -AF) 0.05 % ointment Apply sparingly to affected area(s) twice daily 50 g 2   cetirizine (ZYRTEC) 10 MG tablet Take 10 mg by mouth at bedtime.     Cholecalciferol (VITAMIN D3) 2000 units TABS Take 2,000 Units by mouth at bedtime.     fluticasone (FLONASE) 50 MCG/ACT nasal spray Place 1 spray into both nostrils daily.     hydrochlorothiazide  (HYDRODIURIL ) 12.5 MG tablet Take 1 tablet (12.5 mg total) by mouth every morning. 90 tablet 4   hydrocortisone cream 1 % Apply 1 Application topically as needed (insect bites).     hyoscyamine  (LEVBID) 0.375 MG 12 hr tablet Take 1 tablet (0.375 mg total) by mouth every 12 (twelve) hours at breakfast and bedtime. 180 tablet 4   ipratropium (ATROVENT) 0.03 % nasal spray Place 1-2 sprays into both nostrils as needed (congestion).     OVER THE COUNTER MEDICATION Apply 1 Application topically as needed (insect bites). MelaGel     POTASSIUM GLUCONATE PO Take 99 mg by mouth in the morning and at bedtime.     prednisoLONE  acetate (PRED FORTE ) 1 % ophthalmic suspension Place 1 drop into the affected eye four times daily as directed. 5 mL 0   propranolol  (INDERAL ) 40 MG tablet Take 1 tablet (40 mg total)  by mouth 2 (two) times daily. 180 tablet 4   SALINE NA Place 2 sprays into the nose as needed (congestion).     sodium chloride  (OCEAN) 0.65 % nasal spray Place 2 sprays into the nose as needed for congestion.     Tart Cherry (TART CHERRY ULTRA) 1200 MG CAPS Take 1,200 mg by mouth in the morning and at bedtime.     triamcinolone  cream (KENALOG) 0.1 % Apply 1 Application topically as needed (itching).  1   TURMERIC PO Take 1,000 mg by mouth in the morning and at bedtime. 1000 mg     vitamin B-12 (CYANOCOBALAMIN) 1000 MCG tablet Take 1,000 mcg by mouth at bedtime.      vitamin C (ASCORBIC ACID) 500 MG tablet Take 500 mg by mouth at bedtime.     XDEMVY 0.25 % SOLN      zinc gluconate 50 MG tablet Take 50 mg by mouth at bedtime.     No facility-administered medications prior to visit.     Review of Systems:   Constitutional: No weight loss or gain, night sweats, fevers, chills, fatigue, or lassitude. HEENT: No headaches, difficulty swallowing, tooth/dental problems, or sore throat. No sneezing, itching, ear ache, nasal congestion, or post nasal drip CV:  No chest pain, orthopnea, PND, swelling in lower extremities, anasarca, dizziness, palpitations, syncope Resp: No shortness of breath with exertion or at rest. No excess mucus or change in color of mucus. No productive or non-productive. No hemoptysis. No wheezing.  No chest wall deformity GI:  No heartburn, indigestion, abdominal pain, nausea, vomiting, diarrhea, change in bowel habits, loss of appetite, bloody stools.  GU: No dysuria, change in color of urine, urgency or frequency.  No flank pain, no hematuria  Skin: No rash, lesions, ulcerations MSK:  No joint pain or swelling.  No decreased range of motion.  No back pain. Neuro: No dizziness or lightheadedness.  Psych: No depression or anxiety. Mood stable.     Physical Exam:  BP (!) 140/82 (BP Location: Right Arm, Patient Position: Sitting, Cuff Size: Normal)   Pulse (!) 59   Ht 5\' 5"  (1.651 m)   Wt 150 lb 6.4 oz (68.2 kg)   LMP 12/05/1975   SpO2 98%   BMI 25.03 kg/m   GEN: Pleasant, interactive, well-nourished/chronically-ill appearing/acutely-ill appearing/poorly-nourished/morbidly obese; in no acute distress.****** HEENT:  Normocephalic and atraumatic. EACs patent bilaterally. TM pearly gray with present light reflex bilaterally. PERRLA. Sclera white. Nasal turbinates pink, moist and patent bilaterally. No rhinorrhea present. Oropharynx pink and moist, without exudate or edema. No lesions, ulcerations, or postnasal drip.  NECK:  Supple w/  fair ROM. No JVD present. Normal carotid impulses w/o bruits. Thyroid symmetrical with no goiter or nodules palpated. No lymphadenopathy.   CV: RRR, no m/r/g, no peripheral edema. Pulses intact, +2 bilaterally. No cyanosis, pallor or clubbing. PULMONARY:  Unlabored, regular breathing. Clear bilaterally A&P w/o wheezes/rales/rhonchi. No accessory muscle use.  GI: BS present and normoactive. Soft, non-tender to palpation. No organomegaly or masses detected. No CVA tenderness. MSK: No erythema, warmth or tenderness. Cap refil <2 sec all extrem. No deformities or joint swelling noted.  Neuro: A/Ox3. No focal deficits noted.   Skin: Warm, no lesions or rashe Psych: Normal affect and behavior. Judgement and thought content appropriate.     Lab Results:  CBC    Component Value Date/Time   WBC 5.4 01/07/2022 1002   RBC 4.72 01/07/2022 1002   HGB 14.4 01/07/2022 1002   HCT 43.5  01/07/2022 1002   PLT 193 01/07/2022 1002   MCV 92.2 01/07/2022 1002   MCH 30.5 01/07/2022 1002   MCHC 33.1 01/07/2022 1002   RDW 13.1 01/07/2022 1002    BMET    Component Value Date/Time   NA 142 01/07/2022 1002   K 3.9 01/07/2022 1002   CL 107 01/07/2022 1002   CO2 27 01/07/2022 1002   GLUCOSE 103 (H) 01/07/2022 1002   BUN 13 01/07/2022 1002   CREATININE 0.64 01/07/2022 1002   CALCIUM 9.8 01/07/2022 1002   GFRNONAA >60 01/07/2022 1002    BNP No results found for: "BNP"   Imaging:  DG Knee AP/LAT W/Sunrise Right Result Date: 11/28/2023 CLINICAL DATA:  Worsening right knee pain for the last few weeks. EXAM: RIGHT KNEE 3 VIEWS COMPARISON:  Radiograph 03/12/2020 FINDINGS: The bones are subjectively under mineralized. There is mild lateral translation of the patella with respect to the trochlear groove. Slightly shallow trochlear angle. Peripheral spurring is most prominent in the patellofemoral compartment. No fracture, erosion, or focal bone abnormality. Trace joint effusion. IMPRESSION: 1. Mild  osteoarthritis, most prominent in the patellofemoral compartment. 2. Mild lateral translation of the patella with respect to the trochlear groove, can be seen with patellar maltracking. 3. Trace joint effusion. Electronically Signed   By: Chadwick Colonel M.D.   On: 11/28/2023 17:46   US  LIMITED JOINT SPACE STRUCTURES LOW BILAT(NO LINKED CHARGES) Result Date: 11/26/2023 Limited muscular skeletal ultrasound was performed and interpreted by Ronnell Coins, M Limited ultrasound of patient's right knee shows severe near on bone-on-bone osteoarthritic changes of the patellofemoral and the lateral compartment.  Some mild to moderate arthritic changes of the medial compartment. 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   Advised if symptoms do not improve or worsen, to please contact office for sooner follow up or seek emergency care.   I spent *** minutes of dedicated to the care of this patient on the date of this encounter to include pre-visit review of records, face-to-face time with the patient discussing conditions above, post visit ordering of testing, clinical documentation with the electronic health record, making appropriate referrals as documented, and communicating necessary findings to members of the patients care team.  Roetta Clarke, NP 12/10/2023  Pt aware and understands NP's role.

## 2023-12-10 NOTE — Assessment & Plan Note (Signed)
 She has snoring, restless sleep, insomnia. BMI 25. Family History of OSA. Given this,  I am concerned she could have sleep disordered breathing with obstructive sleep apnea. She will need sleep study for further evaluation.    - discussed how weight can impact sleep and risk for sleep disordered breathing - discussed options to assist with weight loss: combination of diet modification, cardiovascular and strength training exercises   - had an extensive discussion regarding the adverse health consequences related to untreated sleep disordered breathing - specifically discussed the risks for hypertension, coronary artery disease, cardiac dysrhythmias, cerebrovascular disease, and diabetes - lifestyle modification discussed   - discussed how sleep disruption can increase risk of accidents, particularly when driving - safe driving practices were discussed  Patient Instructions  Given your symptoms, I am concerned that you may have sleep disordered breathing with sleep apnea. You will need a sleep study for further evaluation. Someone will contact you to schedule this.   We discussed how untreated sleep apnea puts an individual at risk for cardiac arrhthymias, pulm HTN, DM, stroke and increases their risk for daytime accidents. We also briefly reviewed treatment options including weight loss, side sleeping position, oral appliance, CPAP therapy or referral to ENT for possible surgical options  Use caution when driving and pull over if you become sleepy.  Follow up in 6-8 weeks with Katie Teylor Wolven,NP to go over sleep study results, or sooner, if needed. Friday PM virtual clinic preferred

## 2023-12-17 DIAGNOSIS — L570 Actinic keratosis: Secondary | ICD-10-CM | POA: Diagnosis not present

## 2023-12-17 DIAGNOSIS — L814 Other melanin hyperpigmentation: Secondary | ICD-10-CM | POA: Diagnosis not present

## 2023-12-17 DIAGNOSIS — Z85828 Personal history of other malignant neoplasm of skin: Secondary | ICD-10-CM | POA: Diagnosis not present

## 2023-12-17 DIAGNOSIS — L820 Inflamed seborrheic keratosis: Secondary | ICD-10-CM | POA: Diagnosis not present

## 2023-12-17 DIAGNOSIS — C44519 Basal cell carcinoma of skin of other part of trunk: Secondary | ICD-10-CM | POA: Diagnosis not present

## 2023-12-17 DIAGNOSIS — L821 Other seborrheic keratosis: Secondary | ICD-10-CM | POA: Diagnosis not present

## 2023-12-17 DIAGNOSIS — D1801 Hemangioma of skin and subcutaneous tissue: Secondary | ICD-10-CM | POA: Diagnosis not present

## 2023-12-20 ENCOUNTER — Other Ambulatory Visit: Payer: Self-pay | Admitting: Obstetrics & Gynecology

## 2023-12-20 DIAGNOSIS — Z1231 Encounter for screening mammogram for malignant neoplasm of breast: Secondary | ICD-10-CM

## 2023-12-21 ENCOUNTER — Other Ambulatory Visit (HOSPITAL_BASED_OUTPATIENT_CLINIC_OR_DEPARTMENT_OTHER): Payer: Self-pay

## 2023-12-27 ENCOUNTER — Ambulatory Visit: Admitting: Podiatry

## 2023-12-27 ENCOUNTER — Other Ambulatory Visit (HOSPITAL_BASED_OUTPATIENT_CLINIC_OR_DEPARTMENT_OTHER): Payer: Self-pay

## 2023-12-27 DIAGNOSIS — M79674 Pain in right toe(s): Secondary | ICD-10-CM

## 2023-12-27 DIAGNOSIS — M79675 Pain in left toe(s): Secondary | ICD-10-CM

## 2023-12-27 DIAGNOSIS — Q828 Other specified congenital malformations of skin: Secondary | ICD-10-CM

## 2023-12-27 DIAGNOSIS — B351 Tinea unguium: Secondary | ICD-10-CM | POA: Diagnosis not present

## 2023-12-27 NOTE — Patient Instructions (Addendum)
 You can try Theraworx for the cramping.

## 2023-12-28 NOTE — Progress Notes (Signed)
 Subjective: Chief Complaint  Patient presents with   RFC    Not diabetic. No anticoag. Reports some cramping in her left foot.      83 y.o. returns the office today for painful, elongated, thickened toenails which she cannot trim herself and for calluses.  She states the nails and calluses grow she gets increasing discomfort.  No open lesions or any recent injuries.   She gets ongoing cramps mostly to her left leg which is present time but she states that she appears to mention it.  It is mostly at nighttime.  She has started different supplements.  PCP: Okey Carlin Redbird, MD  Objective: AAO 3, NAD DP/PT pulses palpable, CRT less than 3 seconds Nails hypertrophic, dystrophic, elongated, brittle, discolored 10. There is tenderness overlying the nails 1-5 bilaterally. There is no surrounding erythema or drainage along the nail sites. Hyperkeratotic lesions noted submetatarsal 5 bilaterally as well as submetatarsal 1 on the left foot and the left 2nd toe  There is no underlying ulceration drainage or any signs of infection.  Not able to elicit any area pinpoint tenderness today. Hammertoes present as well as bunion with prominent metatarsal heads. No pain with calf compression, swelling, warmth, erythema.  Assessment: Patient presents with symptomatic onychomycosis; hyperkeratotic lesions  Plan: Symptomatic onychomycosis -Nails sharply debrided 10 without complication/bleeding.  Urea  cream for toenails  Metatarsalgia, hyperkeratotic lesions -Discussed offloading.  Continue with supportive shoes, inserts with offloading. - Sharply hyperkeratotic lesions x 4 any complications or bleeding as a courtesy but I recommend moisturizer, offloading.   Cramping - We discussed adding Thera works to help.  She has also started other supplements.  Circulatory status appears to be intact.  Return in about 2 months (around 12/25/2023).  Veronica Franklin DPM

## 2023-12-29 ENCOUNTER — Telehealth: Payer: Self-pay | Admitting: Family Medicine

## 2023-12-29 ENCOUNTER — Encounter (HOSPITAL_BASED_OUTPATIENT_CLINIC_OR_DEPARTMENT_OTHER): Payer: Self-pay | Admitting: Emergency Medicine

## 2023-12-29 ENCOUNTER — Ambulatory Visit
Admission: RE | Admit: 2023-12-29 | Discharge: 2023-12-29 | Disposition: A | Discharge status: Home / Self Care | Source: Ambulatory Visit | Attending: Obstetrics & Gynecology | Admitting: Obstetrics & Gynecology

## 2023-12-29 ENCOUNTER — Other Ambulatory Visit: Payer: Self-pay

## 2023-12-29 ENCOUNTER — Emergency Department (HOSPITAL_BASED_OUTPATIENT_CLINIC_OR_DEPARTMENT_OTHER)
Admission: EM | Admit: 2023-12-29 | Discharge: 2023-12-29 | Disposition: A | Discharge status: Home / Self Care | Attending: Emergency Medicine | Admitting: Emergency Medicine

## 2023-12-29 DIAGNOSIS — Z1231 Encounter for screening mammogram for malignant neoplasm of breast: Secondary | ICD-10-CM

## 2023-12-29 DIAGNOSIS — Z23 Encounter for immunization: Secondary | ICD-10-CM | POA: Diagnosis not present

## 2023-12-29 DIAGNOSIS — S61211A Laceration without foreign body of left index finger without damage to nail, initial encounter: Secondary | ICD-10-CM | POA: Diagnosis not present

## 2023-12-29 DIAGNOSIS — W260XXA Contact with knife, initial encounter: Secondary | ICD-10-CM | POA: Insufficient documentation

## 2023-12-29 MED ORDER — TETANUS-DIPHTH-ACELL PERTUSSIS 5-2.5-18.5 LF-MCG/0.5 IM SUSY
0.5000 mL | PREFILLED_SYRINGE | Freq: Once | INTRAMUSCULAR | Status: AC
  Administered 2023-12-29: 0.5 mL via INTRAMUSCULAR
  Filled 2023-12-29: qty 0.5

## 2023-12-29 MED ORDER — CEPHALEXIN 500 MG PO CAPS
500.0000 mg | ORAL_CAPSULE | Freq: Three times a day (TID) | ORAL | 0 refills | Status: AC
  Filled 2023-12-29: qty 21, 7d supply, fill #0

## 2023-12-29 MED ORDER — LIDOCAINE HCL (PF) 1 % IJ SOLN
5.0000 mL | Freq: Once | INTRAMUSCULAR | Status: AC
  Administered 2023-12-29: 5 mL
  Filled 2023-12-29: qty 5

## 2023-12-29 NOTE — Telephone Encounter (Signed)
 Patient came in and is requesting to do a sleep study where she goes into a location to do the study rather than one where she does the study at home. She went for her appt several weeks ago for them to send her the supplies and the next follow up they had after that was not until September where she is currently referred. She has not gotten any of the supplies to do the study yet either.  Please advise.

## 2023-12-29 NOTE — Discharge Instructions (Signed)
 Your laceration was repaired with 3 stitches.  These will need to be removed in 7 days.  For any signs of infection return to the emergency department these include drainage, worsening redness or swelling, limitations in range of motion.  Otherwise follow-up with your primary care doctor to have these removed or return to the emergency department and we will remove these for you.  Your tetanus shot was updated today.  Use bacitracin/Neosporin over the area.  I have also sent antibiotic into the pharmacy for you.

## 2023-12-29 NOTE — ED Triage Notes (Signed)
 Small lac to left first finger. Bleeding controlled.

## 2023-12-29 NOTE — ED Triage Notes (Signed)
  Small lac to left first finger. Bleeding controlled. Arrived with steri striped placed on finger.

## 2023-12-29 NOTE — ED Provider Notes (Signed)
 [place] EMERGENCY DEPARTMENT AT [facility] Provider Note   CSN: [id number] Arrival date & time: 12/29/23  1841     Patient presents with: Laceration   Veronica Franklin is a 83 y.o. female.   83 year old female presents today for concern of laceration to left distal index finger.  She states this occurred as she was peeling a peach with a knife.  She used a Steri-Strip however bleeding persisted.  Not on blood thinning medicine.   Laceration Associated symptoms: no fever        Prior to Admission medications   Medication Sig Start Date End Date Taking? Authorizing Provider  cephALEXin  (KEFLEX ) 500 MG capsule Take 1 capsule (500 mg total) by mouth 3 (three) times daily for 7 days. 12/29/23 01/05/24 Yes Lorma Heater, PA-C  acetaminophen  (TYLENOL ) 500 MG tablet Take 1,000 mg by mouth as needed for headache (body aches).    [provider]  augmented betamethasone  dipropionate (DIPROLENE -AF) 0.05 % ointment Apply sparingly to affected area(s) twice daily 06/17/23   Lynnell Nottingham, MD  cetirizine (ZYRTEC) 10 MG tablet Take 10 mg by mouth at bedtime.    [provider]  Cholecalciferol (VITAMIN D3) 2000 units TABS Take 2,000 Units by mouth at bedtime.    [provider]  fluticasone (FLONASE) 50 MCG/ACT nasal spray Place 1 spray into both nostrils daily.    [provider]  hydrochlorothiazide  (HYDRODIURIL ) 12.5 MG tablet Take 1 tablet (12.5 mg total) by mouth every morning. 11/09/23     hydrocortisone cream 1 % Apply 1 Application topically as needed (insect bites).    [provider]  hyoscyamine  (LEVBID ) 0.375 MG 12 hr tablet Take 1 tablet (0.375 mg total) by mouth every 12 (twelve) hours at breakfast and bedtime. 11/09/23     ipratropium (ATROVENT) 0.03 % nasal spray Place 1-2 sprays into both nostrils as needed (congestion).    [provider]  OVER THE COUNTER MEDICATION Apply 1 Application topically as needed (insect  bites). MelaGel    [provider]  POTASSIUM GLUCONATE PO Take 99 mg by mouth in the morning and at bedtime.    [provider]  prednisoLONE  acetate (PRED FORTE ) 1 % ophthalmic suspension Place 1 drop into the affected eye four times daily as directed. 10/20/23     propranolol  (INDERAL ) 40 MG tablet Take 1 tablet (40 mg total) by mouth 2 (two) times daily. 11/09/23     SALINE NA Place 2 sprays into the nose as needed (congestion).    [provider]  sodium chloride  (OCEAN) 0.65 % nasal spray Place 2 sprays into the nose as needed for congestion.    [provider]  Tart Cherry (TART CHERRY ULTRA) 1200 MG CAPS Take 1,200 mg by mouth in the morning and at bedtime.    [provider]  triamcinolone  cream (KENALOG) 0.1 % Apply 1 Application topically as needed (itching). 11/23/17   [provider]  TURMERIC PO Take 1,000 mg by mouth in the morning and at bedtime. 1000 mg    [provider]  vitamin B-12 (CYANOCOBALAMIN) 1000 MCG tablet Take 1,000 mcg by mouth at bedtime.    [provider]  vitamin C (ASCORBIC ACID) 500 MG tablet Take 500 mg by mouth at bedtime.    [provider]  XDEMVY 0.25 % SOLN  12/03/23   [provider]  zinc gluconate 50 MG tablet Take 50 mg by mouth at bedtime.    [provider]  Allergies: Levaquin [levofloxacin], Augmentin  [amoxicillin -pot clavulanate], Azithromycin , Ciprofloxacin , Codeine, Demerol [meperidine], Provera [medroxyprogesterone acetate], Sulfa antibiotics, Tramadol hcl, and Percocet [oxycodone -acetaminophen ]    Review of Systems  Constitutional:  Negative for chills and fever.  Skin:  Positive for wound.  All other systems reviewed and are negative.   Updated Vital Signs BP (!) 145/80   Pulse (!) 56   Temp 98.5 F (36.9 C)   Resp 18   LMP 12/05/1975   SpO2 100%   Physical Exam Vitals and nursing note reviewed.  Constitutional:      General: She  is not in acute distress.    Appearance: Normal appearance. She is not ill-appearing.  HENT:     Head: Normocephalic and atraumatic.     Nose: Nose normal.   Eyes:     Conjunctiva/sclera: Conjunctivae normal.   Pulmonary:     Effort: Pulmonary effort is normal. No respiratory distress.   Musculoskeletal:        General: No deformity.   Skin:    Findings: No rash.     Comments: Small laceration noted to distal left index finger over the volar aspect.  Active bleed noted.  Neurovascularly intact.   Neurological:     Mental Status: She is alert.     (all labs ordered are listed, but only abnormal results are displayed) Labs Reviewed - No data to display  EKG: None  Radiology: No results found.   SABRA.Lac repair Calel Pisarski  Date/Time: 12/29/2023 9:52 PM  Performed by: Hildegard Loge, PA-C Authorized by: Hildegard Loge, PA-C   Consent:    Consent obtained:  Verbal   Consent given by:  Patient   Risks discussed:  Need for additional repair, infection, retained foreign body, poor cosmetic result and poor wound healing   Alternatives discussed:  No treatment Universal protocol:    Procedure explained and questions answered to patient or proxy's satisfaction: yes     Relevant documents present and verified: yes     Patient identity confirmed:  Verbally with patient and arm band Laceration details:    Location:  Finger   Finger location:  L index finger   Length (cm):  3 Pre-procedure details:    Preparation:  Patient was prepped and draped in usual sterile fashion Treatment:    Area cleansed with:  Saline and povidone-iodine   Amount of cleaning:  Extensive   Irrigation solution:  Sterile saline   Irrigation volume:  500   Irrigation method:  Tap   Debridement:  None   Undermining:  None Skin repair:    Repair method:  Sutures   Suture size:  5-0   Suture material:  Prolene   Suture technique:  Simple interrupted   Number of sutures:  3 Approximation:    Approximation:   Close Repair type:    Repair type:  Simple Post-procedure details:    Dressing:  Non-adherent dressing and antibiotic ointment   Procedure completion:  Tolerated well, no immediate complications    Medications Ordered in the ED  Tdap (BOOSTRIX) injection 0.5 mL (0.5 mLs Intramuscular Given 12/29/23 2100)  lidocaine  (PF) (XYLOCAINE ) 1 % injection 5 mL (5 mLs Infiltration Given 12/29/23 2100)                                    Medical Decision Making Risk Prescription drug management.   Laceration repaired as noted in procedure note above.  This occurred earlier  today.  Unable to control bleeding at home.  Repaired with 3 stitches.  Supportive care discussed.  Bacitracin and dressing placed by this provider at the end of the procedure. Discharged in stable condition.  Return precaution discussed.  Patient voices understanding and is in agreement with plan.   Final diagnoses:  Laceration of left index finger without foreign body without damage to nail, initial encounter    ED Discharge Orders          Ordered    cephALEXin  (KEFLEX ) 500 MG capsule  3 times daily       Note to Pharmacy: Tolerated this without issue in the past   12/29/23 2150               Hildegard Loge, PA-C 12/29/23 2153    Ruthe Cornet, DO 12/29/23 2201

## 2023-12-30 ENCOUNTER — Other Ambulatory Visit (HOSPITAL_BASED_OUTPATIENT_CLINIC_OR_DEPARTMENT_OTHER): Payer: Self-pay

## 2024-01-04 ENCOUNTER — Telehealth: Payer: Self-pay

## 2024-01-04 NOTE — Telephone Encounter (Signed)
 Copied from CRM [id number]. Topic: Clinical - Request for Lab/Test Order >> Jan 04, 2024  3:45 PM Veronica Franklin wrote: Reason for CRM: Pt stated she saw Comer Rouleau NP recently as a new patient on 12/10/2023 requesting a sleep study to be completed. Pt stated she initially wanted to do the sleep study at home, but after speaking with others who have done similar studies she would like to have this completed at an office or facility. Please call the pt back with options for a sleep study at [phone] ok to leave a vm.  Please advise [name] pt would like to change sleep study.

## 2024-01-05 NOTE — Telephone Encounter (Signed)
 Insurance does not typically cover an in lab study without a prior sleep study. Home sleep studies are very reliable. If it is inconclusive and symptoms are significant, or she has very severe OSA, then we would recommend an in lab study.

## 2024-01-05 NOTE — Telephone Encounter (Signed)
 Called and spoke with the patient. Pt states she will continue with the HST and see how it turns out. Pt wanted Katie to be aware that she has a sleeping pattern of going to bed around 2-2:30 every night then falls asleep around 4am hoping to get atleast six hours of sleep. Routing to [facility] to schedule HST Patient also has been scheduled for follow up to review HST. FYI Katie

## 2024-01-11 DIAGNOSIS — Z6826 Body mass index (BMI) 26.0-26.9, adult: Secondary | ICD-10-CM | POA: Diagnosis not present

## 2024-01-11 DIAGNOSIS — Z09 Encounter for follow-up examination after completed treatment for conditions other than malignant neoplasm: Secondary | ICD-10-CM | POA: Diagnosis not present

## 2024-01-11 DIAGNOSIS — S61219D Laceration without foreign body of unspecified finger without damage to nail, subsequent encounter: Secondary | ICD-10-CM | POA: Diagnosis not present

## 2024-01-26 ENCOUNTER — Other Ambulatory Visit: Payer: Self-pay

## 2024-01-26 ENCOUNTER — Other Ambulatory Visit (HOSPITAL_BASED_OUTPATIENT_CLINIC_OR_DEPARTMENT_OTHER): Payer: Self-pay

## 2024-01-31 ENCOUNTER — Other Ambulatory Visit (HOSPITAL_BASED_OUTPATIENT_CLINIC_OR_DEPARTMENT_OTHER): Payer: Self-pay

## 2024-02-15 DIAGNOSIS — G4730 Sleep apnea, unspecified: Secondary | ICD-10-CM | POA: Diagnosis not present

## 2024-02-16 ENCOUNTER — Telehealth: Payer: Self-pay

## 2024-02-16 NOTE — Telephone Encounter (Signed)
 Called to reschedule appointment Patient has appointment  tomorrow for Hst review, apt  cancelled because patient is still testing.

## 2024-02-17 ENCOUNTER — Ambulatory Visit: Admitting: Nurse Practitioner

## 2024-02-18 NOTE — Progress Notes (Unsigned)
 [facility] [address] [place] [address] Phone: [phone] Subjective:    I'm seeing this patient by the request  of:  Okey Carlin Redbird, MD  CC: Right knee pain follow-up  YEP:Dlagzrupcz  11/23/2023 Has had difficulty with recurrent UTIs.  Has responded best she states to Augmentin .  We discussed a urinalysis but patient states that she has difficulty urinating during the day.  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  Patient knows to follow-up with primary care provider or seek medical attention immediately if no significant response.     Sent for sleep study secondary to the snoring.  Follow-up again in 6 to 8 weeks      Patient given injection and tolerated the procedure well, discussed icing regimen of home exercises, which activities to do and which ones to avoid.  Increase activity slowly.  Do think patient could be a candidate for viscosupplementation.  Has 2 kids who have already had replacements.  Patient is concerned that this is in her future.  Follow-up again in 8 weeks      Update 02/23/2024 Veronica Franklin is a 83 y.o. female coming in with complaint of B knee R>L and foot pain. Patient states that her pain is off and on. Using tea tree oil when she wakes up in middle of night to rub on R knee.     Past Medical History:  Diagnosis Date   Complication of anesthesia    PONV   Diverticulosis    Fibromyalgia    GERD (gastroesophageal reflux disease)    Hyperlipidemia    Hypertension    IBS (irritable bowel syndrome)    Left fibular fracture 10/04/2013   MVP (mitral valve prolapse)    Patellar dislocation 10/04/2013   Left   Patellar fracture    Left   Pneumonia    Past Surgical History:  Procedure Laterality Date   APPENDECTOMY     BILATERAL SALPINGOOPHORECTOMY  07/10/1998   Endometrosis and LOA   BREAST BIOPSY Bilateral 09/2008   BREAST BIOPSY Right 1959   BREAST BIOPSY Right 1970's & 1981   CATARACT  EXTRACTION Bilateral    CHOLECYSTECTOMY  02/2004   COLONOSCOPY     ear lanced  1944   EXCISION OF BREAST LESION Right 01/13/2022   Procedure: EXCISION OF RIGHT NIPPLE LESION;  Surgeon: Vernetta Berg, MD;  Location: [facility];  Service: General;  Laterality: Right;   HERNIA REPAIR     KNEE DISLOCATION SURGERY Left 2015   thigh lipoma Right    removed   TONSILLECTOMY     TOOTH EXTRACTION     UMBILICAL HERNIA REPAIR  07/10/1998   at same time of Ophorectomy   VAGINAL HYSTERECTOMY  1977   Social History   Socioeconomic History   Marital status: Divorced    Spouse name: Not on file   Number of children: 3   Years of education: Not on file   Highest education level: Not on file  Occupational History   Not on file  Tobacco Use   Smoking status: Never   Smokeless tobacco: Never  Vaping Use   Vaping status: Never Used  Substance and Sexual Activity   Alcohol use: No   Drug use: No   Sexual activity: Not Currently    Partners: Male  Other Topics Concern   Not on file  Social History Narrative   Not on file   Social Drivers of Health   Financial  Resource Strain: Not on file  Food Insecurity: Not on file  Transportation Needs: Not on file  Physical Activity: Not on file  Stress: Not on file  Social Connections: Not on file   Allergies  Allergen Reactions   Levaquin [Levofloxacin] Other (See Comments)    Muscle pain  Tendonitis    Augmentin  [Amoxicillin -Pot Clavulanate] Nausea Only   Azithromycin  Diarrhea and Nausea And Vomiting   Ciprofloxacin  Nausea And Vomiting    Per Dr - told not to take due to Levaquin reation    Codeine Nausea And Vomiting   Demerol [Meperidine] Nausea And Vomiting   Provera [Medroxyprogesterone Acetate]     unknown   Sulfa Antibiotics Nausea And Vomiting   Tramadol Hcl Nausea And Vomiting   Percocet [Oxycodone -Acetaminophen ] Nausea And Vomiting   Family History  Problem Relation Age of Onset   Heart failure Mother    Ulcers Mother     Hypertension Mother    Pleurisy Father    Cancer Father    Cancer Maternal Aunt        uterine cancer   Cancer Paternal Aunt        lymphoma   Diabetes Maternal Grandmother    Diverticulitis Son    Diverticulitis Daughter      Current Outpatient Medications (Cardiovascular):    hydrochlorothiazide  (HYDRODIURIL ) 12.5 MG tablet, Take 1 tablet (12.5 mg total) by mouth every morning.   propranolol  (INDERAL ) 40 MG tablet, Take 1 tablet (40 mg total) by mouth 2 (two) times daily.  Current Outpatient Medications (Respiratory):    cetirizine (ZYRTEC) 10 MG tablet, Take 10 mg by mouth at bedtime.   fluticasone (FLONASE) 50 MCG/ACT nasal spray, Place 1 spray into both nostrils daily.   ipratropium (ATROVENT) 0.03 % nasal spray, Place 1-2 sprays into both nostrils as needed (congestion).   SALINE NA, Place 2 sprays into the nose as needed (congestion).   sodium chloride  (OCEAN) 0.65 % nasal spray, Place 2 sprays into the nose as needed for congestion.  Current Outpatient Medications (Analgesics):    acetaminophen  (TYLENOL ) 500 MG tablet, Take 1,000 mg by mouth as needed for headache (body aches).  Current Outpatient Medications (Hematological):    vitamin B-12 (CYANOCOBALAMIN) 1000 MCG tablet, Take 1,000 mcg by mouth at bedtime.  Current Outpatient Medications (Other):    augmented betamethasone  dipropionate (DIPROLENE -AF) 0.05 % ointment, Apply sparingly to affected area(s) twice daily   Cholecalciferol (VITAMIN D3) 2000 units TABS, Take 2,000 Units by mouth at bedtime.   hydrocortisone cream 1 %, Apply 1 Application topically as needed (insect bites).   hyoscyamine  (LEVBID ) 0.375 MG 12 hr tablet, Take 1 tablet (0.375 mg total) by mouth every 12 (twelve) hours at breakfast and bedtime.   OVER THE COUNTER MEDICATION, Apply 1 Application topically as needed (insect bites). MelaGel   POTASSIUM GLUCONATE PO, Take 99 mg by mouth in the morning and at bedtime.   prednisoLONE  acetate (PRED FORTE )  1 % ophthalmic suspension, Place 1 drop into the affected eye four times daily as directed.   Tart Cherry (TART CHERRY ULTRA) 1200 MG CAPS, Take 1,200 mg by mouth in the morning and at bedtime.   triamcinolone  cream (KENALOG) 0.1 %, Apply 1 Application topically as needed (itching).   TURMERIC PO, Take 1,000 mg by mouth in the morning and at bedtime. 1000 mg   vitamin C (ASCORBIC ACID) 500 MG tablet, Take 500 mg by mouth at bedtime.   XDEMVY 0.25 % SOLN,    zinc gluconate 50 MG  tablet, Take 50 mg by mouth at bedtime.   Reviewed prior external information including notes and imaging from  primary care provider As well as notes that were available from care everywhere and other healthcare systems.  Past medical history, social, surgical and family history all reviewed in electronic medical record.  No pertanent information unless stated regarding to the chief complaint.   Review of Systems:  No headache, visual changes, nausea, vomiting, diarrhea, constipation, dizziness, abdominal pain, skin rash, fevers, chills, night sweats, weight loss, swollen lymph nodes, body aches, joint swelling, chest pain, shortness of breath, mood changes. POSITIVE muscle aches  Objective  Blood pressure 120/82, pulse 67, height 5' 5 (1.651 m), weight 154 lb (69.9 kg), last menstrual period 12/05/1975, SpO2 97%.   General: No apparent distress alert and oriented x3 mood and affect normal, dressed appropriately.  HEENT: Pupils equal, extraocular movements intact  Respiratory: Patient's speak in full sentences and does not appear short of breath  Cardiovascular: No lower extremity edema, non tender, no erythema  Able to get out of the seated position without any significant difficulty.  Seems to be doing relatively well. Mild lateral tracking of the patella noted.  No significant tenderness on exam no swelling noted.   Impression and Recommendations:    The above documentation has been reviewed and is accurate  and complete Yani Coventry M Caress Reffitt, DO

## 2024-02-23 ENCOUNTER — Ambulatory Visit: Admitting: Family Medicine

## 2024-02-23 VITALS — BP 120/82 | HR 67 | Ht 65.0 in | Wt 154.0 lb

## 2024-02-23 DIAGNOSIS — M1711 Unilateral primary osteoarthritis, right knee: Secondary | ICD-10-CM

## 2024-02-23 DIAGNOSIS — N390 Urinary tract infection, site not specified: Secondary | ICD-10-CM

## 2024-02-23 NOTE — Assessment & Plan Note (Signed)
 Patient is doing much better at this time.  We discussed some of the over-the-counter medications.  Discussed still VMO strengthening being the most beneficial thing she can do and continuing to stay active.  Discussed icing regimen and home exercises.  Follow-up again in 6 to 8 weeks

## 2024-02-23 NOTE — Patient Instructions (Signed)
 Labs today 3600mg  of tart cherry 1000mg  of turmeric Nature's Made, Now, puritan's pride Knee looks good See me in 3 months

## 2024-02-24 ENCOUNTER — Ambulatory Visit: Payer: Self-pay | Admitting: Family Medicine

## 2024-02-24 ENCOUNTER — Other Ambulatory Visit

## 2024-02-24 ENCOUNTER — Other Ambulatory Visit: Payer: Self-pay

## 2024-02-24 ENCOUNTER — Other Ambulatory Visit (HOSPITAL_BASED_OUTPATIENT_CLINIC_OR_DEPARTMENT_OTHER): Payer: Self-pay

## 2024-02-24 DIAGNOSIS — N390 Urinary tract infection, site not specified: Secondary | ICD-10-CM | POA: Diagnosis not present

## 2024-02-24 LAB — URINALYSIS, ROUTINE W REFLEX MICROSCOPIC
Bilirubin Urine: NEGATIVE
Ketones, ur: NEGATIVE
Nitrite: NEGATIVE
Specific Gravity, Urine: 1.015 (ref 1.000–1.030)
Total Protein, Urine: NEGATIVE
Urine Glucose: NEGATIVE
Urobilinogen, UA: 0.2 (ref 0.0–1.0)
pH: 6 (ref 5.0–8.0)

## 2024-02-24 MED ORDER — CEPHALEXIN 500 MG PO CAPS
500.0000 mg | ORAL_CAPSULE | Freq: Two times a day (BID) | ORAL | 0 refills | Status: DC
  Filled 2024-02-24: qty 20, 10d supply, fill #0

## 2024-02-25 ENCOUNTER — Other Ambulatory Visit: Payer: Self-pay

## 2024-02-25 DIAGNOSIS — N390 Urinary tract infection, site not specified: Secondary | ICD-10-CM

## 2024-02-25 LAB — URINE CULTURE
MICRO NUMBER:: 16864642
Result:: NO GROWTH
SPECIMEN QUALITY:: ADEQUATE

## 2024-02-28 ENCOUNTER — Ambulatory Visit: Admitting: Podiatry

## 2024-02-28 DIAGNOSIS — Q828 Other specified congenital malformations of skin: Secondary | ICD-10-CM

## 2024-02-28 DIAGNOSIS — M79675 Pain in left toe(s): Secondary | ICD-10-CM | POA: Diagnosis not present

## 2024-02-28 DIAGNOSIS — M79674 Pain in right toe(s): Secondary | ICD-10-CM

## 2024-02-28 DIAGNOSIS — B351 Tinea unguium: Secondary | ICD-10-CM | POA: Diagnosis not present

## 2024-02-29 ENCOUNTER — Ambulatory Visit: Payer: Self-pay | Admitting: Family Medicine

## 2024-03-01 NOTE — Progress Notes (Signed)
 Subjective: Chief Complaint  Patient presents with   Nail Problem    Nail trim     83 y.o. returns the office today for painful, elongated, thickened toenails which she cannot trim herself and for calluses.  She states the nails and calluses grow she gets increasing discomfort.  No open lesions or any recent injuries.   PCP: Okey Carlin Redbird, MD  Objective: AAO 3, NAD DP/PT pulses palpable, CRT less than 3 seconds Nails hypertrophic, dystrophic, elongated, brittle, discolored 10. There is tenderness overlying the nails 1-5 bilaterally. There is no surrounding erythema or drainage along the nail sites. Hyperkeratotic lesions noted submetatarsal 5 bilaterally as well as submetatarsal 1 on the left foot and the left 2nd toe  There is no underlying ulceration drainage or any signs of infection.  Not able to elicit any area pinpoint tenderness today. Hammertoes present as well as bunion with prominent metatarsal heads. No other areas of discomfort. No pain with calf compression, swelling, warmth, erythema.  Assessment: Patient presents with symptomatic onychomycosis; hyperkeratotic lesions  Plan: Symptomatic onychomycosis -Nails sharply debrided 10 without complication/bleeding.  Urea  cream for toenails  Metatarsalgia, hyperkeratotic lesions -Discussed offloading.  Continue with supportive shoes, inserts with offloading. - Sharply hyperkeratotic lesions x 4 any complications or bleeding as a courtesy but I recommend moisturizer, offloading.    Return in about 2 months (around 04/30/2024).  Veronica Franklin DPM

## 2024-03-17 ENCOUNTER — Other Ambulatory Visit (HOSPITAL_BASED_OUTPATIENT_CLINIC_OR_DEPARTMENT_OTHER): Payer: Self-pay

## 2024-03-17 DIAGNOSIS — N3020 Other chronic cystitis without hematuria: Secondary | ICD-10-CM | POA: Diagnosis not present

## 2024-03-17 DIAGNOSIS — N952 Postmenopausal atrophic vaginitis: Secondary | ICD-10-CM | POA: Diagnosis not present

## 2024-03-17 DIAGNOSIS — N8111 Cystocele, midline: Secondary | ICD-10-CM | POA: Diagnosis not present

## 2024-03-17 DIAGNOSIS — R3914 Feeling of incomplete bladder emptying: Secondary | ICD-10-CM | POA: Diagnosis not present

## 2024-03-17 DIAGNOSIS — R8271 Bacteriuria: Secondary | ICD-10-CM | POA: Diagnosis not present

## 2024-03-17 MED ORDER — ESTRADIOL 0.1 MG/GM VA CREA
TOPICAL_CREAM | VAGINAL | 3 refills | Status: AC
  Filled 2024-03-17: qty 42.5, 90d supply, fill #0

## 2024-03-21 ENCOUNTER — Ambulatory Visit: Admitting: Nurse Practitioner

## 2024-03-23 DIAGNOSIS — N3020 Other chronic cystitis without hematuria: Secondary | ICD-10-CM | POA: Diagnosis not present

## 2024-03-23 DIAGNOSIS — R3914 Feeling of incomplete bladder emptying: Secondary | ICD-10-CM | POA: Diagnosis not present

## 2024-03-23 DIAGNOSIS — N8111 Cystocele, midline: Secondary | ICD-10-CM | POA: Diagnosis not present

## 2024-04-04 ENCOUNTER — Other Ambulatory Visit (HOSPITAL_BASED_OUTPATIENT_CLINIC_OR_DEPARTMENT_OTHER): Payer: Self-pay

## 2024-04-04 DIAGNOSIS — K219 Gastro-esophageal reflux disease without esophagitis: Secondary | ICD-10-CM | POA: Diagnosis not present

## 2024-04-04 DIAGNOSIS — K760 Fatty (change of) liver, not elsewhere classified: Secondary | ICD-10-CM | POA: Diagnosis not present

## 2024-04-04 DIAGNOSIS — K602 Anal fissure, unspecified: Secondary | ICD-10-CM | POA: Diagnosis not present

## 2024-04-04 DIAGNOSIS — K625 Hemorrhage of anus and rectum: Secondary | ICD-10-CM | POA: Diagnosis not present

## 2024-04-06 DIAGNOSIS — R399 Unspecified symptoms and signs involving the genitourinary system: Secondary | ICD-10-CM | POA: Diagnosis not present

## 2024-04-06 DIAGNOSIS — N8110 Cystocele, unspecified: Secondary | ICD-10-CM | POA: Diagnosis not present

## 2024-04-06 DIAGNOSIS — R8271 Bacteriuria: Secondary | ICD-10-CM | POA: Diagnosis not present

## 2024-04-07 DIAGNOSIS — N3000 Acute cystitis without hematuria: Secondary | ICD-10-CM | POA: Diagnosis not present

## 2024-04-13 ENCOUNTER — Other Ambulatory Visit (HOSPITAL_BASED_OUTPATIENT_CLINIC_OR_DEPARTMENT_OTHER): Payer: Self-pay

## 2024-04-13 MED ORDER — NITROFURANTOIN MONOHYD MACRO 100 MG PO CAPS
100.0000 mg | ORAL_CAPSULE | Freq: Two times a day (BID) | ORAL | 0 refills | Status: DC
  Filled 2024-04-13: qty 14, 7d supply, fill #0

## 2024-04-14 ENCOUNTER — Other Ambulatory Visit: Payer: Self-pay | Admitting: Medical Genetics

## 2024-04-19 ENCOUNTER — Ambulatory Visit: Admitting: Nurse Practitioner

## 2024-04-19 ENCOUNTER — Encounter: Payer: Self-pay | Admitting: Nurse Practitioner

## 2024-04-19 VITALS — BP 148/86 | HR 69 | Temp 97.8°F | Resp 18 | Ht 64.5 in | Wt 155.6 lb

## 2024-04-19 DIAGNOSIS — G4733 Obstructive sleep apnea (adult) (pediatric): Secondary | ICD-10-CM | POA: Diagnosis not present

## 2024-04-19 DIAGNOSIS — R0683 Snoring: Secondary | ICD-10-CM

## 2024-04-19 NOTE — Patient Instructions (Signed)
 Start CPAP 5-15 cmH2O, nasal mask of choice and heated humidity, every night, minimum of 4-6 hours a night.  Change equipment as directed. Wash your tubing with warm soap and water daily, hang to dry. Wash humidifier portion weekly. Use bottled, distilled water and change daily Be aware of reduced alertness and do not drive or operate heavy machinery if experiencing this or drowsiness.  Exercise encouraged, as tolerated. Healthy weight management discussed.  Avoid or decrease alcohol consumption and medications that make you more sleepy, if possible. Notify if persistent daytime sleepiness occurs even with consistent use of PAP therapy.  Change CPAP supplies... Every month Mask cushions and/or nasal pillows CPAP machine filters Every 3 months Mask frame (not including the headgear) CPAP tubing Every 6 months Mask headgear Chin strap (if applicable) Humidifier water tub  We discussed how untreated sleep apnea puts an individual at risk for cardiac arrhthymias, pulm HTN, DM, stroke and increases their risk for daytime accidents. We also briefly reviewed treatment options including weight loss, side sleeping position, oral appliance, CPAP therapy or referral to ENT for possible surgical options  Follow up in 8-10 weeks with Veronica Rolfe Hartsell,NP to see how CPAP is going. If symptoms do not improve or worsen, please contact office for sooner follow up or seek emergency care.

## 2024-04-19 NOTE — Assessment & Plan Note (Signed)
 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 Reviewed risks of untreated moderate sleep apnea and potential treatment options. Recommendation was made for CPAP therapy given severity and risks of untreated OSA. Pt agreeable. Order placed for auto CPAP 5-15 cmH2O, nasal mask of choice and heated humidity. Educated on proper use/care of device. Risks/benefits reviewed. Safe driving practices reviewed.   Patient Instructions  Start CPAP 5-15 cmH2O, nasal mask of choice and heated humidity, every night, minimum of 4-6 hours a night.  Change equipment as directed. Wash your tubing with warm soap and water daily, hang to dry. Wash humidifier portion weekly. Use bottled, distilled water and change daily Be aware of reduced alertness and do not drive or operate heavy machinery if experiencing this or drowsiness.  Exercise encouraged, as tolerated. Healthy weight management discussed.  Avoid or decrease alcohol consumption and medications that make you more sleepy, if possible. Notify if persistent daytime sleepiness occurs even with consistent use of PAP therapy.  Change CPAP supplies... Every month Mask cushions and/or nasal pillows CPAP machine filters Every 3 months Mask frame (not including the headgear) CPAP tubing Every 6 months Mask headgear Chin strap (if applicable) Humidifier water tub  We discussed how untreated sleep apnea puts an individual at risk for cardiac arrhthymias, pulm HTN, DM, stroke and increases their risk for daytime accidents. We also briefly reviewed treatment options including weight loss, side sleeping position, oral appliance, CPAP therapy or referral to ENT for possible surgical options  Follow up in 8-10 weeks with Katie Jalexa Pifer,NP to see how CPAP is going. If symptoms do not improve or worsen, please contact office for sooner follow up or seek emergency care.

## 2024-04-19 NOTE — Progress Notes (Signed)
 @Patient  ID: Veronica Franklin, female    DOB: 10/01/1940, 83 y.o.   MRN: [id number]  Chief Complaint  Patient presents with   Follow-up    Referring provider: Okey Carlin Redbird, MD  HPI: 83 year old female, never smoker referred for sleep consult. Past medical history significant for fibromyalgia, MVP, allergic rhinitis, fatty liver, IBS.   TEST/EVENTS:  02/15/2024 HST: AHI 19.7/h, SpO2 low 75%  12/10/2023: OV with Garner Dullea NP Maayan Jenning is an 83 year old female who presents with sleep disturbances and snoring. She experiences snoring, which her children have observed, although friends who have shared a room with her have not. No episodes of waking herself up snoring, gasping, choking, or coughing. She has difficulty falling asleep, often not going to bed until 2 AM, but generally stays asleep once she does. She attributes this to her fibromyalgia. She sets her alarm for 8:30 AM and occasionally wakes up earlier. Despite the late bedtime, she does not feel tired during the day and reports good energy levels. She does not use any sleep aids and denies drowsy driving, morning headaches, sleepwalking, or sleep paralysis. She was diagnosed with fibromyalgia in the late 1980s. Wearing socks at night has actually helped her fall asleep more easily. She does not consume alcohol and avoids caffeine due to breast tumors (not cancerous), opting for caffeine-free beverages like Fanta and occasionally decaffeinated coffee and tea. She is primarily a side sleeper, often on her left side, which is more comfortable. Her dog sleeps with her and changes positions throughout the night. Family history is notable for her oldest son, who was diagnosed with sleep apnea 20-25 years ago. He uses a CPAP machine and has a history of obesity, having undergone gastric bypass surgery.  She is retired. Lives alone but her son stays with her four nights out of the week. No significant weight change over the last two  years. [place] 6    04/19/2024: Today - follow up Discussed the use of AI scribe software for clinical note transcription with the patient, who gave verbal consent to proceed.  History of Present Illness Veronica Franklin is an 83 year old female who presents for follow up after home sleep study.  She has been diagnosed with moderate sleep apnea following a sleep study. Her son has noticed significant snoring. He has encouraged her to seek treatment for her snoring. He also wears a CPAP. She feels tired and has decreased energy. No significant change from last OV.   She travels frequently and is interested in the possibility of a mini CPAP for travel purposes, as she has seen friends use them.  She mentions that she sleeps on her side.  No drowsy driving.    Allergies  Allergen Reactions   Levaquin [Levofloxacin] Other (See Comments)    Muscle pain  Tendonitis    Augmentin  [Amoxicillin -Pot Clavulanate] Nausea Only   Azithromycin  Diarrhea and Nausea And Vomiting   Ciprofloxacin  Nausea And Vomiting    Per Dr - told not to take due to Levaquin reation    Codeine Nausea And Vomiting   Demerol [Meperidine] Nausea And Vomiting   Provera [Medroxyprogesterone Acetate]     unknown   Sulfa Antibiotics Nausea And Vomiting   Tramadol Hcl Nausea And Vomiting   Percocet [Oxycodone -Acetaminophen ] Nausea And Vomiting    Immunization History  Administered Date(s) Administered   DTaP 12/13/2010   Fluad Quad(high Dose 65+) 05/10/2019   INFLUENZA, HIGH DOSE SEASONAL PF  05/06/2018   Influenza Split 05/01/2013, 05/17/2020   Influenza,inj,Quad PF,6+ Mos 06/16/2017   Influenza,inj,quad, With Preservative 03/08/2015, 03/12/2016   PFIZER(Purple Top)SARS-COV-2 Vaccination 09/08/2019, 09/29/2019, 05/17/2020   Pneumococcal Conjugate-13 01/25/2014   Tdap 12/13/2010, 10/03/2020, 12/29/2023    Past Medical History:  Diagnosis Date   Complication of anesthesia    PONV   Diverticulosis     Fibromyalgia    GERD (gastroesophageal reflux disease)    Hyperlipidemia    Hypertension    IBS (irritable bowel syndrome)    Left fibular fracture 10/04/2013   MVP (mitral valve prolapse)    Patellar dislocation 10/04/2013   Left   Patellar fracture    Left   Pneumonia     Tobacco History: Social History   Tobacco Use  Smoking Status Never  Smokeless Tobacco Never   Counseling given: Not Answered   Outpatient Medications Prior to Visit  Medication Sig Dispense Refill   acetaminophen  (TYLENOL ) 500 MG tablet Take 1,000 mg by mouth as needed for headache (body aches).     augmented betamethasone  dipropionate (DIPROLENE -AF) 0.05 % ointment Apply sparingly to affected area(s) twice daily 50 g 2   cephALEXin  (KEFLEX ) 500 MG capsule Take 1 capsule (500 mg total) by mouth 2 (two) times daily. 20 capsule 0   cetirizine (ZYRTEC) 10 MG tablet Take 10 mg by mouth at bedtime.     Cholecalciferol (VITAMIN D3) 2000 units TABS Take 2,000 Units by mouth at bedtime.     estradiol  (ESTRACE ) 0.1 MG/GM vaginal cream Place pea-sized amount in the vagina/urethra and night before bed 2-3 times a week 42.5 g 3   fluticasone (FLONASE) 50 MCG/ACT nasal spray Place 1 spray into both nostrils daily.     hydrochlorothiazide  (HYDRODIURIL ) 12.5 MG tablet Take 1 tablet (12.5 mg total) by mouth every morning. 90 tablet 4   hydrocortisone cream 1 % Apply 1 Application topically as needed (insect bites).     hyoscyamine  (LEVBID ) 0.375 MG 12 hr tablet Take 1 tablet (0.375 mg total) by mouth every 12 (twelve) hours at breakfast and bedtime. 180 tablet 4   ipratropium (ATROVENT) 0.03 % nasal spray Place 1-2 sprays into both nostrils as needed (congestion).     nitrofurantoin , macrocrystal-monohydrate, (MACROBID ) 100 MG capsule Take 1 capsule (100 mg total) by mouth 2 (two) times daily. 14 capsule 0   OVER THE COUNTER MEDICATION Apply 1 Application topically as needed (insect bites). MelaGel     POTASSIUM GLUCONATE  PO Take 99 mg by mouth in the morning and at bedtime.     prednisoLONE  acetate (PRED FORTE ) 1 % ophthalmic suspension Place 1 drop into the affected eye four times daily as directed. 5 mL 0   propranolol  (INDERAL ) 40 MG tablet Take 1 tablet (40 mg total) by mouth 2 (two) times daily. 180 tablet 4   SALINE NA Place 2 sprays into the nose as needed (congestion).     sodium chloride  (OCEAN) 0.65 % nasal spray Place 2 sprays into the nose as needed for congestion.     Tart Cherry (TART CHERRY ULTRA) 1200 MG CAPS Take 1,200 mg by mouth in the morning and at bedtime.     triamcinolone  cream (KENALOG) 0.1 % Apply 1 Application topically as needed (itching).  1   TURMERIC PO Take 1,000 mg by mouth in the morning and at bedtime. 1000 mg     vitamin B-12 (CYANOCOBALAMIN) 1000 MCG tablet Take 1,000 mcg by mouth at bedtime.     vitamin C (ASCORBIC ACID)  500 MG tablet Take 500 mg by mouth at bedtime.     XDEMVY 0.25 % SOLN      zinc gluconate 50 MG tablet Take 50 mg by mouth at bedtime.     No facility-administered medications prior to visit.     Review of Systems: as above    Physical Exam:  BP (!) 148/86 (BP Location: Right Arm, Patient Position: Sitting, Cuff Size: Normal)   Pulse 69   Temp 97.8 F (36.6 C) (Oral)   Resp 18   Ht 5' 4.5 (1.638 m)   Wt 155 lb 9.6 oz (70.6 kg)   LMP 12/05/1975   SpO2 95%   BMI 26.30 kg/m   GEN: Pleasant, interactive, well-appearing; in no acute distress. HEENT:  Normocephalic and atraumatic. PERRLA. Sclera white. Nasal turbinates pink, moist and patent bilaterally. No rhinorrhea present. Oropharynx pink and moist, without exudate or edema. No lesions, ulcerations, or postnasal drip. Mallampati II. Absent tonsils NECK:  Supple w/ fair ROM. No lymphadenopathy.   CV: RRR, no m/r/g PULMONARY:  Unlabored, regular breathing. Clear bilaterally A&P w/o wheezes/rales/rhonchi. No accessory muscle use.  GI: BS present and normoactive. Soft, non-tender to palpation.   Neuro: A/Ox3. No focal deficits noted.   Skin: Warm, no lesions or rashe Psych: Normal affect and behavior. Judgement and thought content appropriate.     Lab Results:  CBC    Component Value Date/Time   WBC 5.4 01/07/2022 1002   RBC 4.72 01/07/2022 1002   HGB 14.4 01/07/2022 1002   HCT 43.5 01/07/2022 1002   PLT 193 01/07/2022 1002   MCV 92.2 01/07/2022 1002   MCH 30.5 01/07/2022 1002   MCHC 33.1 01/07/2022 1002   RDW 13.1 01/07/2022 1002    BMET    Component Value Date/Time   NA 142 01/07/2022 1002   K 3.9 01/07/2022 1002   CL 107 01/07/2022 1002   CO2 27 01/07/2022 1002   GLUCOSE 103 (H) 01/07/2022 1002   BUN 13 01/07/2022 1002   CREATININE 0.64 01/07/2022 1002   CALCIUM 9.8 01/07/2022 1002   GFRNONAA >60 01/07/2022 1002    BNP No results found for: BNP   Imaging:  No results found.   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 Reviewed risks of untreated moderate sleep apnea and potential treatment options. Recommendation was made for CPAP therapy given severity and risks of untreated OSA. Pt agreeable. Order placed for auto CPAP 5-15 cmH2O, nasal mask of choice and heated humidity. Educated on proper use/care of device. Risks/benefits reviewed. Safe driving practices reviewed.   Patient Instructions  Start CPAP 5-15 cmH2O, nasal mask of choice and heated humidity, every night, minimum of 4-6 hours a night.  Change equipment as directed. Wash your tubing with warm soap and water daily, hang to dry. Wash humidifier portion weekly. Use bottled, distilled water and change daily Be aware of reduced alertness and do not drive or operate heavy machinery if experiencing this or drowsiness.  Exercise encouraged, as tolerated. Healthy weight management discussed.  Avoid or decrease alcohol  consumption and medications that make you more sleepy, if possible. Notify if persistent daytime sleepiness occurs even with consistent use of PAP therapy.  Change CPAP supplies... Every month Mask  cushions and/or nasal pillows CPAP machine filters Every 3 months Mask frame (not including the headgear) CPAP tubing Every 6 months Mask headgear Chin strap (if applicable) Humidifier water tub  We discussed how untreated sleep apnea puts an individual at risk for cardiac arrhthymias, pulm HTN, DM, stroke and increases their risk for daytime accidents. We also briefly reviewed treatment options including weight loss, side sleeping position, oral appliance, CPAP therapy or referral to ENT for possible surgical options  Follow up in 8-10 weeks with Katie Hutchinson Isenberg,NP to see how CPAP is going. If symptoms do not improve or worsen, please contact office for sooner follow up or seek emergency care.    Advised if symptoms do not improve or worsen, to please contact office for sooner follow up or seek emergency care.   I spent 35 minutes of dedicated to the care of this patient on the date of this encounter to include pre-visit review of records, face-to-face time with the patient discussing conditions above, post visit ordering of testing, clinical documentation with the electronic health record, making appropriate referrals as documented, and communicating necessary findings to members of the patients care team.  Comer LULLA Rouleau, NP 04/19/2024  Pt aware and understands NP's role.

## 2024-04-26 ENCOUNTER — Other Ambulatory Visit: Payer: Self-pay

## 2024-04-27 ENCOUNTER — Other Ambulatory Visit: Payer: Self-pay

## 2024-04-27 ENCOUNTER — Other Ambulatory Visit (HOSPITAL_BASED_OUTPATIENT_CLINIC_OR_DEPARTMENT_OTHER): Payer: Self-pay

## 2024-05-01 ENCOUNTER — Ambulatory Visit: Admitting: Podiatry

## 2024-05-01 ENCOUNTER — Encounter: Payer: Self-pay | Admitting: Podiatry

## 2024-05-01 VITALS — Ht 64.5 in | Wt 155.0 lb

## 2024-05-01 DIAGNOSIS — M79675 Pain in left toe(s): Secondary | ICD-10-CM

## 2024-05-01 DIAGNOSIS — M79674 Pain in right toe(s): Secondary | ICD-10-CM

## 2024-05-01 DIAGNOSIS — B351 Tinea unguium: Secondary | ICD-10-CM

## 2024-05-03 NOTE — Progress Notes (Signed)
 Subjective: Chief Complaint  Patient presents with   Nail Problem    Patient is here for RFC for Pain due to onychomycosis of toenails of both feet, patient also having foot pain and tenderness due to callous    83 y.o. returns the office today for painful, elongated, thickened toenails which she cannot trim herself and for calluses.  She states the nails and calluses grow she gets increasing discomfort.  No injuries or new concerns today.  PCP: Okey Carlin Redbird, MD  Objective: AAO 3, NAD DP/PT pulses palpable, CRT less than 3 seconds Nails hypertrophic, dystrophic, elongated, brittle, discolored 10. There is tenderness overlying the nails 1-5 bilaterally. There is no surrounding erythema or drainage along the nail sites. Hyperkeratotic lesions noted submetatarsal 5 bilaterally as well as submetatarsal 1 on the left foot and the left 2nd toe  There is no underlying ulceration drainage or any signs of infection.  Not able to elicit any area pinpoint tenderness today. Hammertoes present as well as bunion with prominent metatarsal heads. No pain with calf compression, swelling, warmth, erythema.  Assessment: Patient presents with symptomatic onychomycosis; hyperkeratotic lesions  Plan: Symptomatic onychomycosis -Nails sharply debrided 10 without complication/bleeding.  Urea  cream for toenails  Metatarsalgia, hyperkeratotic lesions -Discussed offloading.  Continue with supportive shoes, inserts with offloading. - Sharply hyperkeratotic lesions x 4 any complications or bleeding as a courtesy but I recommend moisturizer, offloading.  Return in about 2 months (around 07/01/2024).  Veronica Franklin DPM

## 2024-05-08 ENCOUNTER — Other Ambulatory Visit: Payer: Self-pay

## 2024-05-08 DIAGNOSIS — Z006 Encounter for examination for normal comparison and control in clinical research program: Secondary | ICD-10-CM

## 2024-05-09 ENCOUNTER — Telehealth (HOSPITAL_BASED_OUTPATIENT_CLINIC_OR_DEPARTMENT_OTHER): Payer: Self-pay

## 2024-05-09 NOTE — Telephone Encounter (Signed)
 CMN received for CPAP supplies sent to [facility]  signed by provider and faxed confirmation received

## 2024-05-16 LAB — GENECONNECT MOLECULAR SCREEN: Genetic Analysis Overall Interpretation: NEGATIVE

## 2024-05-29 ENCOUNTER — Other Ambulatory Visit (HOSPITAL_BASED_OUTPATIENT_CLINIC_OR_DEPARTMENT_OTHER): Payer: Self-pay

## 2024-05-29 MED ORDER — NITROFURANTOIN MONOHYD MACRO 100 MG PO CAPS
100.0000 mg | ORAL_CAPSULE | Freq: Two times a day (BID) | ORAL | 0 refills | Status: AC
  Filled 2024-05-29: qty 14, 7d supply, fill #0

## 2024-06-06 ENCOUNTER — Other Ambulatory Visit (HOSPITAL_BASED_OUTPATIENT_CLINIC_OR_DEPARTMENT_OTHER): Payer: Self-pay

## 2024-06-06 ENCOUNTER — Other Ambulatory Visit (HOSPITAL_COMMUNITY): Payer: Self-pay

## 2024-06-06 DIAGNOSIS — J301 Allergic rhinitis due to pollen: Secondary | ICD-10-CM | POA: Diagnosis not present

## 2024-06-08 ENCOUNTER — Ambulatory Visit: Admitting: Nurse Practitioner

## 2024-06-08 NOTE — Progress Notes (Signed)
 [facility] [address] [place] [address] Phone: [phone] Subjective:   Veronica Franklin, am serving as a scribe for Dr. Arthea Claudene.  I'm seeing this patient by the request  of:  Okey Carlin Redbird, MD  CC: right knee pain   YEP:Dlagzrupcz  02/23/2024 Patient is doing much better at this time.  We discussed some of the over-the-counter medications.  Discussed still VMO strengthening being the most beneficial thing she can do and continuing to stay active.  Discussed icing regimen and home exercises.  Follow-up again in 6 to 8 weeks     Update 06/13/2024 Veronica Franklin is a 83 y.o. female coming in with complaint of R knee pain. Patient states L foot. Goes back to podiatrist next week. Knee is doing well He has not even thought about her knee for some time..       Past Medical History:  Diagnosis Date   Complication of anesthesia    PONV   Diverticulosis    Fibromyalgia    GERD (gastroesophageal reflux disease)    Hyperlipidemia    Hypertension    IBS (irritable bowel syndrome)    Left fibular fracture 10/04/2013   MVP (mitral valve prolapse)    Patellar dislocation 10/04/2013   Left   Patellar fracture    Left   Pneumonia    Past Surgical History:  Procedure Laterality Date   APPENDECTOMY     BILATERAL SALPINGOOPHORECTOMY  07/10/1998   Endometrosis and LOA   BREAST BIOPSY Bilateral 09/2008   BREAST BIOPSY Right 1959   BREAST BIOPSY Right 1970's & 1981   CATARACT EXTRACTION Bilateral    CHOLECYSTECTOMY  02/2004   COLONOSCOPY     ear lanced  1944   EXCISION OF BREAST LESION Right 01/13/2022   Procedure: EXCISION OF RIGHT NIPPLE LESION;  Surgeon: Vernetta Berg, MD;  Location: [facility];  Service: General;  Laterality: Right;   HERNIA REPAIR     KNEE DISLOCATION SURGERY Left 2015   thigh lipoma Right    removed   TONSILLECTOMY     TOOTH EXTRACTION     UMBILICAL HERNIA REPAIR  07/10/1998   at same time of  Ophorectomy   VAGINAL HYSTERECTOMY  1977   Social History   Socioeconomic History   Marital status: Divorced    Spouse name: Not on file   Number of children: 3   Years of education: Not on file   Highest education level: Not on file  Occupational History   Not on file  Tobacco Use   Smoking status: Never   Smokeless tobacco: Never  Vaping Use   Vaping status: Never Used  Substance and Sexual Activity   Alcohol use: No   Drug use: No   Sexual activity: Not Currently    Partners: Male  Other Topics Concern   Not on file  Social History Narrative   Not on file   Social Drivers of Health   Financial Resource Strain: Not on file  Food Insecurity: Not on file  Transportation Needs: Not on file  Physical Activity: Not on file  Stress: Not on file  Social Connections: Not on file   Allergies  Allergen Reactions   Levaquin [Levofloxacin] Other (See Comments)    Muscle pain  Tendonitis    Augmentin  [Amoxicillin -Pot Clavulanate] Nausea Only   Azithromycin  Diarrhea and Nausea And Vomiting   Ciprofloxacin  Nausea And Vomiting    Per Dr - told not to take  due to Levaquin reation    Codeine Nausea And Vomiting   Demerol [Meperidine] Nausea And Vomiting   Provera [Medroxyprogesterone Acetate]     unknown   Sulfa Antibiotics Nausea And Vomiting   Tramadol Hcl Nausea And Vomiting   Percocet [Oxycodone -Acetaminophen ] Nausea And Vomiting   Family History  Problem Relation Age of Onset   Heart failure Mother    Ulcers Mother    Hypertension Mother    Pleurisy Father    Cancer Father    Cancer Maternal Aunt        uterine cancer   Cancer Paternal Aunt        lymphoma   Diabetes Maternal Grandmother    Diverticulitis Son    Diverticulitis Daughter     Current Outpatient Medications (Endocrine & Metabolic):    predniSONE  (DELTASONE ) 20 MG tablet, Take 1 tablet (20 mg total) by mouth daily with breakfast.  Current Outpatient Medications (Cardiovascular):     hydrochlorothiazide  (HYDRODIURIL ) 12.5 MG tablet, Take 1 tablet (12.5 mg total) by mouth every morning.   propranolol  (INDERAL ) 40 MG tablet, Take 1 tablet (40 mg total) by mouth 2 (two) times daily.  Current Outpatient Medications (Respiratory):    cetirizine (ZYRTEC) 10 MG tablet, Take 10 mg by mouth at bedtime.   fluticasone (FLONASE) 50 MCG/ACT nasal spray, Place 1 spray into both nostrils daily.   ipratropium (ATROVENT) 0.03 % nasal spray, Place 1-2 sprays into both nostrils as needed (congestion).   SALINE NA, Place 2 sprays into the nose as needed (congestion).   sodium chloride  (OCEAN) 0.65 % nasal spray, Place 2 sprays into the nose as needed for congestion.  Current Outpatient Medications (Analgesics):    acetaminophen  (TYLENOL ) 500 MG tablet, Take 1,000 mg by mouth as needed for headache (body aches).  Current Outpatient Medications (Hematological):    vitamin B-12 (CYANOCOBALAMIN) 1000 MCG tablet, Take 1,000 mcg by mouth at bedtime.  Current Outpatient Medications (Other):    augmented betamethasone  dipropionate (DIPROLENE -AF) 0.05 % ointment, Apply sparingly to affected area(s) twice daily   cephALEXin  (KEFLEX ) 500 MG capsule, Take 1 capsule (500 mg total) by mouth 2 (two) times daily.   Cholecalciferol (VITAMIN D3) 2000 units TABS, Take 2,000 Units by mouth at bedtime.   estradiol  (ESTRACE ) 0.1 MG/GM vaginal cream, Place pea-sized amount in the vagina/urethra and night before bed 2-3 times a week   hydrocortisone cream 1 %, Apply 1 Application topically as needed (insect bites).   hyoscyamine  (LEVBID ) 0.375 MG 12 hr tablet, Take 1 tablet (0.375 mg total) by mouth every 12 (twelve) hours at breakfast and bedtime.   OVER THE COUNTER MEDICATION, Apply 1 Application topically as needed (insect bites). MelaGel   POTASSIUM GLUCONATE PO, Take 99 mg by mouth in the morning and at bedtime.   prednisoLONE  acetate (PRED FORTE ) 1 % ophthalmic suspension, Place 1 drop into the affected eye  four times daily as directed.   Tart Cherry (TART CHERRY ULTRA) 1200 MG CAPS, Take 1,200 mg by mouth in the morning and at bedtime.   triamcinolone  cream (KENALOG) 0.1 %, Apply 1 Application topically as needed (itching).   TURMERIC PO, Take 1,000 mg by mouth in the morning and at bedtime. 1000 mg   vitamin C (ASCORBIC ACID) 500 MG tablet, Take 500 mg by mouth at bedtime.   XDEMVY 0.25 % SOLN,    zinc gluconate 50 MG tablet, Take 50 mg by mouth at bedtime.    Objective  Blood pressure 118/82, pulse 78, height 5'  4 (1.626 m), weight 154 lb (69.9 kg), last menstrual period 12/05/1975, SpO2 97%.   General: No apparent distress alert and oriented x3 mood and affect normal, dressed appropriately.  HEENT: Pupils equal, extraocular movements intact  Respiratory: Patient's speak in full sentences and does not appear short of breath  Cardiovascular: No lower extremity edema, non tender, no erythema  Right hip some crepitus noted.  Some lateral tracking of the patella noted.    Impression and Recommendations:     The above documentation has been reviewed and is accurate and complete Jurney Overacker M Jyll Tomaro, DO

## 2024-06-13 ENCOUNTER — Other Ambulatory Visit (HOSPITAL_BASED_OUTPATIENT_CLINIC_OR_DEPARTMENT_OTHER): Payer: Self-pay

## 2024-06-13 ENCOUNTER — Ambulatory Visit: Admitting: Family Medicine

## 2024-06-13 VITALS — BP 118/82 | HR 78 | Ht 64.0 in | Wt 154.0 lb

## 2024-06-13 DIAGNOSIS — J341 Cyst and mucocele of nose and nasal sinus: Secondary | ICD-10-CM

## 2024-06-13 DIAGNOSIS — G4733 Obstructive sleep apnea (adult) (pediatric): Secondary | ICD-10-CM | POA: Diagnosis not present

## 2024-06-13 DIAGNOSIS — J3089 Other allergic rhinitis: Secondary | ICD-10-CM | POA: Diagnosis not present

## 2024-06-13 DIAGNOSIS — M1711 Unilateral primary osteoarthritis, right knee: Secondary | ICD-10-CM | POA: Diagnosis not present

## 2024-06-13 MED ORDER — PREDNISONE 20 MG PO TABS
20.0000 mg | ORAL_TABLET | Freq: Every day | ORAL | 0 refills | Status: DC
  Filled 2024-06-13: qty 7, 7d supply, fill #0

## 2024-06-13 NOTE — Assessment & Plan Note (Signed)
 I believe patient does me potential sinusitis or middle ear effusion with patient traveling.  Prednisone  20 mg.

## 2024-06-13 NOTE — Assessment & Plan Note (Signed)
 Seeing pathology in the near future.  Unable to tolerate the CPAP.  Wondering if patient is a candidate for inspire

## 2024-06-13 NOTE — Assessment & Plan Note (Signed)
 Doing extremely no change in management, follow-up with me again in 6 months.  Continue VMO strengthening exercises and staying active

## 2024-06-13 NOTE — Patient Instructions (Addendum)
 Prednisone  20mg  7 days Glad knee is doing great Happy Holidays See you again in 6 months

## 2024-06-21 ENCOUNTER — Ambulatory Visit: Admitting: Nurse Practitioner

## 2024-06-21 ENCOUNTER — Encounter: Payer: Self-pay | Admitting: Nurse Practitioner

## 2024-06-21 VITALS — BP 106/70 | HR 71 | Ht 64.5 in | Wt 157.6 lb

## 2024-06-21 DIAGNOSIS — G4733 Obstructive sleep apnea (adult) (pediatric): Secondary | ICD-10-CM | POA: Diagnosis not present

## 2024-06-21 DIAGNOSIS — F5101 Primary insomnia: Secondary | ICD-10-CM

## 2024-06-21 NOTE — Progress Notes (Signed)
 @Patient  ID: Veronica Franklin, female    DOB: 20-Dec-1940, 83 y.o.   MRN: [id number]  Chief Complaint  Patient presents with   Sleep Apnea    Does not like the CPAP machine.  She usually gets 6 hours of sleep, she is not getting 2-4 hours of sleep.  Having a hard time going to sleep.  Felt better before she started the CPAP machine.    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 Past medical history significant for fibromyalgia, MVP, allergic rhinitis, fatty liver, IBS.   TEST/EVENTS:  02/15/2024 HST: AHI 19.7/h, SpO2 low 75%  12/10/2023: OV with Veronica Lunn NP Ethylene Reznick is an 83 year old female who presents with sleep disturbances and snoring. She experiences snoring, which her children have observed, although friends who have shared a room with her have not. No episodes of waking herself up snoring, gasping, choking, or coughing. She has difficulty falling asleep, often not going to bed until 2 AM, but generally stays asleep once she does. She attributes this to her fibromyalgia. She sets her alarm for 8:30 AM and occasionally wakes up earlier. Despite the late bedtime, she does not feel tired during the day and reports good energy levels. She does not use any sleep aids and denies drowsy driving, morning headaches, sleepwalking, or sleep paralysis. She was diagnosed with fibromyalgia in the late 1980s. Wearing socks at night has actually helped her fall asleep more easily. She does not consume alcohol and avoids caffeine due to breast tumors (not cancerous), opting for caffeine-free beverages like Fanta and occasionally decaffeinated coffee and tea. She is primarily a side sleeper, often on her left side, which is more comfortable. Her dog sleeps with her and changes positions throughout the night. Family history is notable for her oldest son, who was diagnosed with sleep apnea 20-25 years ago. He uses a CPAP machine and  has a history of obesity, having undergone gastric bypass surgery.  She is retired. Lives alone but her son stays with her four nights out of the week. No significant weight change over the last two years. [place] 6    04/19/2024: OV with Yamile Roedl NP Kerry Odonohue is an 83 year old female who presents for follow up after home sleep study. She has been diagnosed with moderate sleep apnea following a sleep study. Her son has noticed significant snoring. He has encouraged her to seek treatment for her snoring. He also wears a CPAP. She feels tired and has decreased energy. No significant change from last OV.  She travels frequently and is interested in the possibility of a mini CPAP for travel purposes, as she has seen friends use them. She mentions that she sleeps on her side. No drowsy driving.  [id number]/[id number]: Today - follow up Discussed the use of AI scribe software for clinical note transcription with the patient, who gave verbal consent to proceed.  History of Present Illness Veronica Franklin is an 83 year old female with moderate sleep apnea who presents with issues related to CPAP use.  She experiences persistent fatigue despite regular CPAP use, feeling less energetic than before starting the therapy. She has read articles questioning CPAP effectiveness, which adds to her concerns.  She reports occasional air leaks from the CPAP mask, particularly from the top, but has not noticed any issues with the tubing. Doesn't necessarily feel like leaks are waking her  up.   Her sleep is further affected by fibromyalgia, diagnosed in the 1980s, which contributes to her difficulty falling asleep. Her usual bedtime is around 2 AM, aiming for six hours of sleep, but sometimes it takes until 4 AM to fall asleep. She declines medication to aid sleep due to past issues with medications.     Allergies  Allergen Reactions   Levaquin [Levofloxacin] Other (See Comments)    Muscle pain  Tendonitis     Augmentin  [Amoxicillin -Pot Clavulanate] Nausea Only   Azithromycin  Diarrhea and Nausea And Vomiting   Ciprofloxacin  Nausea And Vomiting    Per Dr - told not to take due to Levaquin reation    Codeine Nausea And Vomiting   Demerol [Meperidine] Nausea And Vomiting   Provera [Medroxyprogesterone Acetate]     unknown   Sulfa Antibiotics Nausea And Vomiting   Tramadol Hcl Nausea And Vomiting   Percocet [Oxycodone -Acetaminophen ] Nausea And Vomiting    Immunization History  Administered Date(s) Administered   DTaP 12/13/2010   Fluad Quad(high Dose 65+) 05/10/2019   INFLUENZA, HIGH DOSE SEASONAL PF 05/06/2018   Influenza Split 05/01/2013, 05/17/2020   Influenza,inj,Quad PF,6+ Mos 06/16/2017   Influenza,inj,quad, With Preservative 03/08/2015, 03/12/2016   PFIZER(Purple Top)SARS-COV-2 Vaccination 09/08/2019, 09/29/2019, 05/17/2020   Pneumococcal Conjugate-13 01/25/2014   Tdap 12/13/2010, 10/03/2020, 12/29/2023    Past Medical History:  Diagnosis Date   Complication of anesthesia    PONV   Diverticulosis    Fibromyalgia    GERD (gastroesophageal reflux disease)    Hyperlipidemia    Hypertension    IBS (irritable bowel syndrome)    Left fibular fracture 10/04/2013   MVP (mitral valve prolapse)    Patellar dislocation 10/04/2013   Left   Patellar fracture    Left   Pneumonia     Tobacco History: Social History   Tobacco Use  Smoking Status Never  Smokeless Tobacco Never   Counseling given: Not Answered   Outpatient Medications Prior to Visit  Medication Sig Dispense Refill   acetaminophen  (TYLENOL ) 500 MG tablet Take 1,000 mg by mouth as needed for headache (body aches).     cetirizine (ZYRTEC) 10 MG tablet Take 10 mg by mouth at bedtime.     Cholecalciferol (VITAMIN D3) 2000 units TABS Take 2,000 Units by mouth at bedtime.     estradiol  (ESTRACE ) 0.1 MG/GM vaginal cream Place pea-sized amount in the vagina/urethra and night before bed 2-3 times a week 42.5 g 3    fluticasone (FLONASE) 50 MCG/ACT nasal spray Place 1 spray into both nostrils daily.     hydrochlorothiazide  (HYDRODIURIL ) 12.5 MG tablet Take 1 tablet (12.5 mg total) by mouth every morning. 90 tablet 4   hydrocortisone cream 1 % Apply 1 Application topically as needed (insect bites).     hyoscyamine  (LEVBID ) 0.375 MG 12 hr tablet Take 1 tablet (0.375 mg total) by mouth every 12 (twelve) hours at breakfast and bedtime. 180 tablet 4   ipratropium (ATROVENT) 0.03 % nasal spray Place 1-2 sprays into both nostrils as needed (congestion).     OVER THE COUNTER MEDICATION Apply 1 Application topically as needed (insect bites). MelaGel     POTASSIUM GLUCONATE PO Take 99 mg by mouth in the morning and at bedtime.     propranolol  (INDERAL ) 40 MG tablet Take 1 tablet (40 mg total) by mouth 2 (two) times daily. 180 tablet 4   SALINE NA Place 2 sprays into the nose as needed (congestion).     sodium chloride  (OCEAN)  0.65 % nasal spray Place 2 sprays into the nose as needed for congestion.     Tart Cherry (TART CHERRY ULTRA) 1200 MG CAPS Take 1,200 mg by mouth in the morning and at bedtime.     triamcinolone  cream (KENALOG) 0.1 % Apply 1 Application topically as needed (itching).  1   TURMERIC PO Take 1,000 mg by mouth in the morning and at bedtime. 1000 mg     vitamin B-12 (CYANOCOBALAMIN) 1000 MCG tablet Take 1,000 mcg by mouth at bedtime.     vitamin C (ASCORBIC ACID) 500 MG tablet Take 500 mg by mouth at bedtime.     XDEMVY 0.25 % SOLN      zinc gluconate 50 MG tablet Take 50 mg by mouth at bedtime.     augmented betamethasone  dipropionate (DIPROLENE -AF) 0.05 % ointment Apply sparingly to affected area(s) twice daily (Patient not taking: Reported on 06/21/2024) 50 g 2   cephALEXin  (KEFLEX ) 500 MG capsule Take 1 capsule (500 mg total) by mouth 2 (two) times daily. (Patient not taking: Reported on 06/21/2024) 20 capsule 0   prednisoLONE  acetate (PRED FORTE ) 1 % ophthalmic suspension Place 1 drop into the  affected eye four times daily as directed. (Patient not taking: Reported on 06/21/2024) 5 mL 0   predniSONE  (DELTASONE ) 20 MG tablet Take 1 tablet (20 mg total) by mouth daily with breakfast. (Patient not taking: Reported on 06/21/2024) 7 tablet 0   No facility-administered medications prior to visit.     Review of Systems: as above    Physical Exam:  BP 106/70 (BP Location: Right Arm, Patient Position: Sitting)   Pulse 71   Ht 5' 4.5 (1.638 m)   Wt 157 lb 9.6 oz (71.5 kg)   LMP 12/05/1975   SpO2 97% Comment: RA  BMI 26.63 kg/m   GEN: Pleasant, interactive, well-appearing; in no acute distress. HEENT:  Normocephalic and atraumatic. PERRLA. Sclera white. Nasal turbinates pink, moist and patent bilaterally. No rhinorrhea present. Oropharynx pink and moist, without exudate or edema. No lesions, ulcerations, or postnasal drip. Mallampati II. Absent tonsils NECK:  Supple w/ fair ROM. No lymphadenopathy.   CV: RRR, no m/r/g PULMONARY:  Unlabored, regular breathing. Clear bilaterally A&P w/o wheezes/rales/rhonchi. No accessory muscle use.  GI: BS present and normoactive. Soft, non-tender to palpation.  Neuro: A/Ox3. No focal deficits noted.   Skin: Warm, no lesions or rashe Psych: Normal affect and behavior. Judgement and thought content appropriate.     Lab Results:  CBC    Component Value Date/Time   WBC 5.4 01/07/2022 1002   RBC 4.72 01/07/2022 1002   HGB 14.4 01/07/2022 1002   HCT 43.5 01/07/2022 1002   PLT 193 01/07/2022 1002   MCV 92.2 01/07/2022 1002   MCH 30.5 01/07/2022 1002   MCHC 33.1 01/07/2022 1002   RDW 13.1 01/07/2022 1002    BMET    Component Value Date/Time   NA 142 01/07/2022 1002   K 3.9 01/07/2022 1002   CL 107 01/07/2022 1002   CO2 27 01/07/2022 1002   GLUCOSE 103 (H) 01/07/2022 1002   BUN 13 01/07/2022 1002   CREATININE 0.64 01/07/2022 1002   CALCIUM 9.8 01/07/2022 1002   GFRNONAA >60 01/07/2022 1002    BNP No results found for:  BNP   Imaging:  No results found.   Administration History     None           No data to display  No results found for: NITRICOXIDE      Assessment & Plan:   Moderate obstructive sleep apnea Moderate OSA with AHI 19/h. Reviewed risks of untreated moderate sleep apnea and potential treatment options. Excellent control and good compliance on CPAP. Difficulties with adjusting to CPAP therapy with non-restorative sleep. Will try adjusting CPAP pressure to auto 5-10 cmH2O and trial AirTouch N30i mask. Discussed alternative therapies as well. Would prefer to avoid surgical intervention given age. Advised we could investigate cost of an oral appliance with referral to orthodontics, but may not be cost effective. Referral placed today. Encouraged to continue utilizing CPAP nightly. Educated on proper use/care of device. Risks/benefits reviewed. Safe driving practices reviewed.   Patient Instructions  Continue to use CPAP every night, minimum of 4-6 hours a night.  Change equipment as directed. Wash your tubing with warm soap and water daily, hang to dry. Wash humidifier portion weekly. Use bottled, distilled water and change daily Be aware of reduced alertness and do not drive or operate heavy machinery if experiencing this or drowsiness.  Exercise encouraged, as tolerated. Healthy weight management discussed.  Avoid or decrease alcohol consumption and medications that make you more sleepy, if possible. Notify if persistent daytime sleepiness occurs even with consistent use of PAP therapy.  Change CPAP supplies... Every month Mask cushions and/or nasal pillows CPAP machine filters Every 3 months Mask frame (not including the headgear) CPAP tubing Every 6 months Mask headgear Chin strap (if applicable) Humidifier water tub   Adjust CPAP 5-10 cmH2O and see if this helps  Try the nasal mask I gave you today   Referral to orthodontics for a possible oral  appliance   Follow up in 6-8 weeks with Tammy Parrett,NP or Beth Walsh,NP to see how changes with CPAP helped. If symptoms do not improve or worsen, please contact office for sooner follow up or seek emergency care.    Insomnia Persistent insomniac symptoms. Possible sleep maintenance issues exacerbated by CPAP. See above. Offered sleep aid such as trazodone. Declined at this time. Sleep hygiene reviewed.    Advised if symptoms do not improve or worsen, to please contact office for sooner follow up or seek emergency care.   I spent 35 minutes of dedicated to the care of this patient on the date of this encounter to include pre-visit review of records, face-to-face time with the patient discussing conditions above, post visit ordering of testing, clinical documentation with the electronic health record, making appropriate referrals as documented, and communicating necessary findings to members of the patients care team.  Comer LULLA Rouleau, NP 06/21/2024  Pt aware and understands NP's role.

## 2024-06-21 NOTE — Patient Instructions (Addendum)
 Continue to use CPAP every night, minimum of 4-6 hours a night.  Change equipment as directed. Wash your tubing with warm soap and water daily, hang to dry. Wash humidifier portion weekly. Use bottled, distilled water and change daily Be aware of reduced alertness and do not drive or operate heavy machinery if experiencing this or drowsiness.  Exercise encouraged, as tolerated. Healthy weight management discussed.  Avoid or decrease alcohol consumption and medications that make you more sleepy, if possible. Notify if persistent daytime sleepiness occurs even with consistent use of PAP therapy.  Change CPAP supplies... Every month Mask cushions and/or nasal pillows CPAP machine filters Every 3 months Mask frame (not including the headgear) CPAP tubing Every 6 months Mask headgear Chin strap (if applicable) Humidifier water tub   Adjust CPAP 5-10 cmH2O and see if this helps  Try the nasal mask I gave you today   Referral to orthodontics for a possible oral appliance   Follow up in 6-8 weeks with Tammy Parrett,NP or Beth Walsh,NP to see how changes with CPAP helped. If symptoms do not improve or worsen, please contact office for sooner follow up or seek emergency care.

## 2024-06-21 NOTE — Assessment & Plan Note (Signed)
 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 Reviewed risks of untreated moderate sleep apnea and potential treatment options. Excellent control and good compliance on CPAP. Difficulties with adjusting to CPAP therapy with non-restorative sleep. Will try adjusting CPAP pressure to auto 5-10 cmH2O and trial AirTouch N30i mask. Discussed alternative therapies as well. Would prefer to avoid surgical intervention given age. Advised we could investigate cost of an oral appliance with referral to orthodontics, but may not be cost effective. Referral placed today. Encouraged to continue utilizing CPAP nightly. Educated on proper use/care of device. Risks/benefits reviewed. Safe driving practices reviewed.   Patient Instructions  Continue to use CPAP every night, minimum of 4-6 hours a night.  Change equipment as directed. Wash your tubing with warm soap and water daily, hang to dry. Wash humidifier portion weekly. Use bottled, distilled water and change daily Be aware of reduced alertness and do not drive or operate heavy machinery if experiencing this or drowsiness.  Exercise encouraged, as tolerated. Healthy weight management discussed.  Avoid or decrease alcohol consumption and medications that make you more sleepy, if possible. Notify if persistent daytime sleepiness occurs even with consistent use of PAP therapy.  Change CPAP supplies... Every month Mask cushions and/or nasal pillows CPAP machine filters Every 3 months Mask frame (not including the headgear) CPAP tubing Every 6 months Mask headgear Chin strap (if applicable) Humidifier water tub   Adjust CPAP 5-10 cmH2O and see if this helps  Try the nasal mask I gave you today   Referral to orthodontics for a possible oral appliance   Follow up in 6-8 weeks with Tammy Parrett,NP or Beth Walsh,NP to see how changes with CPAP helped. If symptoms do not improve or worsen, please contact office for sooner follow up or seek emergency care.

## 2024-06-21 NOTE — Assessment & Plan Note (Signed)
 Persistent insomniac symptoms. Possible sleep maintenance issues exacerbated by CPAP. See above. Offered sleep aid such as trazodone. Declined at this time. Sleep hygiene reviewed.

## 2024-06-23 ENCOUNTER — Telehealth: Payer: Self-pay | Admitting: *Deleted

## 2024-06-23 NOTE — Telephone Encounter (Signed)
 Please have her reach out to her DME and tell them what mask she got and see if they can help troubleshoot regarding supplies.

## 2024-06-23 NOTE — Telephone Encounter (Signed)
Pt is aware. Nothing further needed 

## 2024-06-23 NOTE — Telephone Encounter (Signed)
 Please advise as to what was given to pt at visit yesterday.  Reason for CRM: patient was seen yesterday and provider gave me a new head piece and the cpap machine will not connect to that new head piece . so what do i do  Please give patient a call concerning her head piece  [phone]

## 2024-07-03 ENCOUNTER — Ambulatory Visit: Admitting: Podiatry

## 2024-07-03 DIAGNOSIS — B351 Tinea unguium: Secondary | ICD-10-CM | POA: Diagnosis not present

## 2024-07-03 DIAGNOSIS — M79674 Pain in right toe(s): Secondary | ICD-10-CM

## 2024-07-03 DIAGNOSIS — M79675 Pain in left toe(s): Secondary | ICD-10-CM

## 2024-07-03 NOTE — Progress Notes (Signed)
 Subjective: Chief Complaint  Patient presents with   RFC    Patient presents today for RFC , patient relates she might have calluses on her left foot  she will also like shaved     83 y.o. returns the office today for painful, elongated, thickened toenails which she cannot trim herself and for calluses.  She use over-the-counter corn remover pads which is been helping on the left foot.  No open lesions or any drainage.  PCP: Okey Carlin Redbird, MD  Objective: AAO 3, NAD DP/PT pulses palpable, CRT less than 3 seconds Nails hypertrophic, dystrophic, elongated, brittle, discolored 10. There is tenderness overlying the nails 1-5 bilaterally. There is no surrounding erythema or drainage along the nail sites. Hyperkeratotic lesions noted submetatarsal 5 bilaterally as well as submetatarsal 1 on the left foot and the left 2nd toe  There is no underlying ulceration drainage or any signs of infection.   Hammertoes present as well as bunion with prominent metatarsal heads. No pain with calf compression, swelling, warmth, erythema.  Assessment: Patient presents with symptomatic onychomycosis; hyperkeratotic lesions  Plan: Symptomatic onychomycosis -Nails sharply debrided 10 without complication/bleeding.  Urea  cream for toenails  Metatarsalgia, hyperkeratotic lesions -Discussed offloading.  Continue with supportive shoes, inserts with offloading. - Sharply hyperkeratotic lesions x 4 any complications or bleeding as a courtesy they were minimal today as she has recently used callus remover's.  Return in about 2 months (around 09/03/2024).  Donnice JONELLE Fees DPM

## 2024-07-05 ENCOUNTER — Ambulatory Visit: Admitting: Nurse Practitioner

## 2024-07-28 ENCOUNTER — Other Ambulatory Visit (HOSPITAL_BASED_OUTPATIENT_CLINIC_OR_DEPARTMENT_OTHER): Payer: Self-pay

## 2024-08-03 ENCOUNTER — Encounter: Payer: Self-pay | Admitting: Adult Health

## 2024-08-03 ENCOUNTER — Ambulatory Visit: Admitting: Adult Health

## 2024-08-03 VITALS — BP 131/77 | HR 72 | Ht 64.75 in | Wt 157.2 lb

## 2024-08-03 DIAGNOSIS — G4733 Obstructive sleep apnea (adult) (pediatric): Secondary | ICD-10-CM | POA: Diagnosis not present

## 2024-08-03 NOTE — Progress Notes (Signed)
 "  @Patient  ID: Veronica Franklin JAYSON Veronica Franklin, female    DOB: 1940-09-15, 84 y.o.   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  Patient recently underwent evaluation for sleep apnea symptoms with restless sleep and snoring.  She was set up for a home sleep study that was done on February 15, 2024 found to have moderate obstructive sleep apnea.  Patient was started on CPAP therapy in December.   CPAP download shows 100% compliance.  Daily average usage at 5.5 hours.  Patient is on auto CPAP 5 to 10 cm H2O.  AHI 4.8/hour. DME is [facility]. Using modified full face mask. Feels she is benefiting from CPAP.     Allergies[1]  Immunization History  Administered Date(s) Administered   DTaP 12/13/2010   Fluad Quad(high Dose 65+) 05/10/2019   INFLUENZA, HIGH DOSE SEASONAL PF 05/06/2018   Influenza Split 05/01/2013, 05/17/2020   Influenza,inj,Quad PF,6+ Mos 06/16/2017   Influenza,inj,quad, With Preservative 03/08/2015, 03/12/2016   PFIZER(Purple Top)SARS-COV-2 Vaccination 09/08/2019, 09/29/2019, 05/17/2020   Pneumococcal Conjugate-13 01/25/2014   Tdap 12/13/2010, 10/03/2020, 12/29/2023    Past Medical History:  Diagnosis Date   Complication of anesthesia    PONV   Diverticulosis    Fibromyalgia    GERD (gastroesophageal reflux disease)    Hyperlipidemia    Hypertension    IBS (irritable bowel syndrome)    Left fibular fracture 10/04/2013   MVP (mitral valve prolapse)    Patellar dislocation 10/04/2013   Left   Patellar fracture    Left   Pneumonia     Tobacco History: Tobacco Use History[2] Counseling given: Not Answered   Outpatient  Medications Prior to Visit  Medication Sig Dispense Refill   acetaminophen  (TYLENOL ) 500 MG tablet Take 1,000 mg by mouth as needed for headache (body aches).     benzonatate (TESSALON) 200 MG capsule Take by mouth.     Cholecalciferol (VITAMIN D3) 2000 units TABS Take 2,000 Units by mouth at bedtime.     fluticasone (FLONASE) 50 MCG/ACT nasal spray Place 1 spray into both nostrils daily.     hydrochlorothiazide  (HYDRODIURIL ) 12.5 MG tablet Take 1 tablet (12.5 mg total) by mouth every morning. 90 tablet 4   hydrocortisone cream 1 % Apply 1 Application topically as needed (insect bites).     hyoscyamine  (LEVBID ) 0.375 MG 12 hr tablet Take 1 tablet (0.375 mg total) by mouth every 12 (twelve) hours at breakfast and bedtime. 180 tablet 4   ipratropium (ATROVENT) 0.03 % nasal spray Place 1-2 sprays into both nostrils as needed (congestion).     OVER THE COUNTER MEDICATION Apply 1 Application topically as needed (insect bites). MelaGel     POTASSIUM GLUCONATE PO Take 99 mg by mouth in the morning and at bedtime.     propranolol  (INDERAL ) 40 MG tablet Take 1 tablet (40 mg total) by mouth 2 (two) times daily. 180 tablet 4   SALINE NA Place 2 sprays into the nose as needed (congestion).     sodium chloride  (OCEAN) 0.65 % nasal spray Place 2 sprays into the nose as needed for congestion.     Tart Cherry (TART CHERRY  ULTRA) 1200 MG CAPS Take 1,200 mg by mouth in the morning and at bedtime.     triamcinolone  cream (KENALOG) 0.1 % Apply 1 Application topically as needed (itching).  1   TURMERIC PO Take 1,000 mg by mouth in the morning and at bedtime. 1000 mg     vitamin B-12 (CYANOCOBALAMIN) 1000 MCG tablet Take 1,000 mcg by mouth at bedtime.     vitamin C (ASCORBIC ACID) 500 MG tablet Take 500 mg by mouth at bedtime.     zinc gluconate 50 MG tablet Take 50 mg by mouth at bedtime.     cetirizine (ZYRTEC) 10 MG tablet Take 10 mg by mouth at bedtime. (Patient not taking: Reported on 08/03/2024)     estradiol   (ESTRACE ) 0.1 MG/GM vaginal cream Place pea-sized amount in the vagina/urethra and night before bed 2-3 times a week (Patient not taking: Reported on 08/03/2024) 42.5 g 3   XDEMVY 0.25 % SOLN  (Patient not taking: Reported on 08/03/2024)     No facility-administered medications prior to visit.     Review of Systems:   Constitutional:   No  weight loss, night sweats,  Fevers, chills, fatigue, or  lassitude.  HEENT:   No headaches,  Difficulty swallowing,  Tooth/dental problems, or  Sore throat,                No sneezing, itching, ear ache, nasal congestion, post nasal drip,   CV:  No chest pain,  Orthopnea, PND, swelling in lower extremities, anasarca, dizziness, palpitations, syncope.   GI  No heartburn, indigestion, abdominal pain, nausea, vomiting, diarrhea, change in bowel habits, loss of appetite, bloody stools.   Resp: No shortness of breath with exertion or at rest.  No excess mucus, no productive cough,  No non-productive cough,  No coughing up of blood.  No change in color of mucus.  No wheezing.  No chest wall deformity  Skin: no rash or lesions.  GU: no dysuria, change in color of urine, no urgency or frequency.  No flank pain, no hematuria   MS:  No joint pain or swelling.  No decreased range of motion.  No back pain.    Physical Exam  BP 131/77   Pulse 72   Ht 5' 4.75 (1.645 m) Comment: Per pt  Wt 157 lb 3.2 oz (71.3 kg)   LMP 12/05/1975   SpO2 95% Comment: RA  BMI 26.36 kg/m   GEN: A/Ox3; pleasant , NAD, well nourished    HEENT:  [place]/AT,   NOSE-clear, THROAT-clear, no lesions, no postnasal drip or exudate noted.   NECK:  Supple w/ fair ROM; no JVD; normal carotid impulses w/o bruits; no thyromegaly or nodules palpated; no lymphadenopathy.    RESP  Clear  P & A; w/o, wheezes/ rales/ or rhonchi. no accessory muscle use, no dullness to percussion  CARD:  RRR, no m/r/g, no peripheral edema, pulses intact, no cyanosis or clubbing.  GI:   Soft & nt; nml bowel  sounds; no organomegaly or masses detected.   Musco: Warm bil, no deformities or joint swelling noted.   Neuro: alert, no focal deficits noted.    Skin: Warm, no lesions or rashes    Lab Results:Reviewed 08/03/2024   CBC    BNP No results found for: BNP  ProBNP No results found for: PROBNP  Imaging: No results found.  Administration History     None           No data to display  No results found for: NITRICOXIDE     12/10/2023    9:00 AM  Results of the [facility] flowsheet  Sitting and reading 2  Watching TV 2  Sitting, inactive in a public place (e.g. a theatre or a meeting) 0  As a passenger in a car for an hour without a break 1  Lying down to rest in the afternoon when circumstances permit 1  Sitting and talking to someone 0  Sitting quietly after a lunch without alcohol 0  In a car, while stopped for a few minutes in traffic 0  Total score 6        Assessment & Plan:   Assessment and Plan Assessment & Plan  OSA -excellent control and compliance on CPAP with perceived benefit. CPAP . CPAP care discussed.   - discussed how weight can impact sleep and risk for sleep disordered breathing - discussed options to assist with weight loss: combination of diet modification, cardiovascular and strength training exercises   - had an extensive discussion regarding the adverse health consequences related to untreated sleep disordered breathing - specifically discussed the risks for hypertension, coronary artery disease, cardiac dysrhythmias, cerebrovascular disease, and diabetes - lifestyle modification discussed   - discussed how sleep disruption can increase risk of accidents, particularly when driving - safe driving practices were discussed  Nasal dryness-recommend Saline spray and gel    Plan  Patient Instructions  Continue on CPAP At bedtime   Keep up good work  Do not drive if sleepy  Saline nasal spray Twice daily   Saline  nasal gel At bedtime   Follow up in 6 months with Dr. Neda or Billy Rocco NP and As needed            Madelin Stank, NP 08/03/2024      [1]  Allergies Allergen Reactions   Levaquin [Levofloxacin] Other (See Comments)    Muscle pain  Tendonitis    Augmentin  [Amoxicillin -Pot Clavulanate] Nausea Only   Azithromycin  Diarrhea and Nausea And Vomiting   Ciprofloxacin  Nausea And Vomiting    Per Dr - told not to take due to Levaquin reation    Codeine Nausea And Vomiting   Demerol [Meperidine] Nausea And Vomiting   Provera [Medroxyprogesterone Acetate]     unknown   Sulfa Antibiotics Nausea And Vomiting   Tramadol Hcl Nausea And Vomiting   Percocet [Oxycodone -Acetaminophen ] Nausea And Vomiting  [2]  Social History Tobacco Use  Smoking Status Never  Smokeless Tobacco Never   "

## 2024-08-03 NOTE — Patient Instructions (Addendum)
 Continue on CPAP At bedtime   Keep up good work  Do not drive if sleepy  Saline nasal spray Twice daily   Saline nasal gel At bedtime   Follow up in 6 months with Dr. Neda or Ciaira Natividad NP and As needed

## 2024-08-07 ENCOUNTER — Other Ambulatory Visit (HOSPITAL_BASED_OUTPATIENT_CLINIC_OR_DEPARTMENT_OTHER): Payer: Self-pay

## 2024-08-08 ENCOUNTER — Other Ambulatory Visit (HOSPITAL_BASED_OUTPATIENT_CLINIC_OR_DEPARTMENT_OTHER): Payer: Self-pay

## 2024-09-05 ENCOUNTER — Ambulatory Visit: Admitting: Podiatry

## 2024-12-12 ENCOUNTER — Ambulatory Visit: Admitting: Family Medicine
